# Patient Record
Sex: Female | Born: 1935 | Race: Black or African American | Hispanic: No | Marital: Single | Smoking: Never smoker
Health system: Southern US, Community
[De-identification: ages and names within clinical notes are randomized; demographics above are authoritative.]

## PROBLEM LIST (undated history)

## (undated) DIAGNOSIS — M1990 Unspecified osteoarthritis, unspecified site: Secondary | ICD-10-CM

## (undated) DIAGNOSIS — K219 Gastro-esophageal reflux disease without esophagitis: Secondary | ICD-10-CM

## (undated) DIAGNOSIS — N189 Chronic kidney disease, unspecified: Secondary | ICD-10-CM

## (undated) DIAGNOSIS — E785 Hyperlipidemia, unspecified: Secondary | ICD-10-CM

## (undated) DIAGNOSIS — I10 Essential (primary) hypertension: Secondary | ICD-10-CM

## (undated) HISTORY — DX: Essential (primary) hypertension: I10

## (undated) HISTORY — DX: Chronic kidney disease, unspecified: N18.9

## (undated) HISTORY — DX: Gastro-esophageal reflux disease without esophagitis: K21.9

## (undated) HISTORY — DX: Unspecified osteoarthritis, unspecified site: M19.90

## (undated) HISTORY — DX: Hyperlipidemia, unspecified: E78.5

## (undated) HISTORY — PX: EYE SURGERY: SHX253

---

## 2010-04-13 ENCOUNTER — Observation Stay: Payer: Self-pay | Admitting: Internal Medicine

## 2011-07-15 ENCOUNTER — Emergency Department: Payer: Self-pay | Admitting: *Deleted

## 2011-07-15 LAB — CBC WITH DIFFERENTIAL/PLATELET
Basophil %: 0.6 %
Eosinophil #: 0.1 10*3/uL (ref 0.0–0.7)
Eosinophil %: 1.3 %
HCT: 32.1 % — ABNORMAL LOW (ref 35.0–47.0)
HGB: 10 g/dL — ABNORMAL LOW (ref 12.0–16.0)
MCH: 25.5 pg — ABNORMAL LOW (ref 26.0–34.0)
MCHC: 31.2 g/dL — ABNORMAL LOW (ref 32.0–36.0)
MCV: 82 fL (ref 80–100)
Monocyte #: 0.5 10*3/uL (ref 0.0–0.7)
Monocyte %: 10.3 %
Neutrophil #: 3 10*3/uL (ref 1.4–6.5)
Neutrophil %: 64 %
RBC: 3.93 10*6/uL (ref 3.80–5.20)

## 2011-07-15 LAB — BASIC METABOLIC PANEL
BUN: 13 mg/dL (ref 7–18)
Calcium, Total: 9.7 mg/dL (ref 8.5–10.1)
Chloride: 103 mmol/L (ref 98–107)
Co2: 30 mmol/L (ref 21–32)
EGFR (African American): 59 — ABNORMAL LOW
Osmolality: 277 (ref 275–301)
Potassium: 3.7 mmol/L (ref 3.5–5.1)
Sodium: 139 mmol/L (ref 136–145)

## 2012-03-03 ENCOUNTER — Emergency Department: Payer: Self-pay | Admitting: Emergency Medicine

## 2014-05-21 ENCOUNTER — Ambulatory Visit: Payer: Self-pay | Admitting: Family Medicine

## 2014-06-20 ENCOUNTER — Ambulatory Visit: Payer: Self-pay | Admitting: Internal Medicine

## 2014-07-01 ENCOUNTER — Ambulatory Visit: Payer: Self-pay | Admitting: Hematology and Oncology

## 2014-07-02 LAB — RETICULOCYTES
ABSOLUTE RETIC COUNT: 0.073 10*6/uL (ref 0.019–0.186)
Reticulocyte: 2 % (ref 0.4–3.1)

## 2014-07-02 LAB — FERRITIN: FERRITIN (ARMC): 61 ng/mL (ref 8–388)

## 2014-07-02 LAB — CBC CANCER CENTER
HCT: 30.9 % — AB (ref 35.0–47.0)
HGB: 9.3 g/dL — ABNORMAL LOW (ref 12.0–16.0)
Lymphocytes: 31 %
MCH: 25 pg — ABNORMAL LOW (ref 26.0–34.0)
MCHC: 30.1 g/dL — ABNORMAL LOW (ref 32.0–36.0)
MCV: 83 fL (ref 80–100)
MONOS PCT: 7 %
Platelet: 224 x10 3/mm (ref 150–440)
RBC: 3.72 10*6/uL — AB (ref 3.80–5.20)
RDW: 16.1 % — AB (ref 11.5–14.5)
Segmented Neutrophils: 62 %
WBC: 5 x10 3/mm (ref 3.6–11.0)

## 2014-07-02 LAB — FOLATE: Folic Acid: 19.9 ng/mL — ABNORMAL HIGH (ref 3.1–17.5)

## 2014-07-02 LAB — IRON AND TIBC
IRON BIND. CAP.(TOTAL): 358 ug/dL (ref 250–450)
Iron Saturation: 12 %
Iron: 44 ug/dL — ABNORMAL LOW (ref 50–170)
Unbound Iron-Bind.Cap.: 314 ug/dL

## 2014-07-02 LAB — LACTATE DEHYDROGENASE: LDH: 223 U/L (ref 81–246)

## 2014-07-03 LAB — PROT IMMUNOELECTROPHORES(ARMC)

## 2014-07-14 ENCOUNTER — Ambulatory Visit: Payer: Self-pay | Admitting: Internal Medicine

## 2014-07-14 ENCOUNTER — Ambulatory Visit: Payer: Self-pay | Admitting: Hematology and Oncology

## 2014-07-14 LAB — URINE IEP, RANDOM

## 2014-07-16 LAB — OCCULT BLOOD X 1 CARD TO LAB, STOOL
Occult Blood, Feces: NEGATIVE
Occult Blood, Feces: NEGATIVE

## 2014-08-12 ENCOUNTER — Ambulatory Visit
Admit: 2014-08-12 | Disposition: A | Discharge status: Home / Self Care | Payer: Self-pay | Attending: Internal Medicine | Admitting: Internal Medicine

## 2014-11-19 ENCOUNTER — Ambulatory Visit: Payer: Self-pay | Admitting: Internal Medicine

## 2014-11-19 ENCOUNTER — Other Ambulatory Visit: Payer: Self-pay

## 2014-11-20 ENCOUNTER — Other Ambulatory Visit: Payer: Self-pay

## 2014-11-20 DIAGNOSIS — D649 Anemia, unspecified: Secondary | ICD-10-CM

## 2014-11-21 ENCOUNTER — Other Ambulatory Visit: Payer: Medicare PPO

## 2014-11-21 ENCOUNTER — Ambulatory Visit: Payer: Self-pay | Admitting: Internal Medicine

## 2014-12-02 ENCOUNTER — Encounter: Payer: Self-pay | Admitting: Internal Medicine

## 2014-12-02 ENCOUNTER — Inpatient Hospital Stay: Payer: Medicare PPO

## 2014-12-02 ENCOUNTER — Inpatient Hospital Stay: Payer: Medicare PPO | Attending: Internal Medicine | Admitting: Internal Medicine

## 2014-12-02 VITALS — BP 189/75 | HR 60 | Temp 97.8°F | Resp 18 | Ht 64.0 in | Wt 153.4 lb

## 2014-12-02 DIAGNOSIS — D631 Anemia in chronic kidney disease: Secondary | ICD-10-CM | POA: Diagnosis not present

## 2014-12-02 DIAGNOSIS — N289 Disorder of kidney and ureter, unspecified: Secondary | ICD-10-CM | POA: Diagnosis not present

## 2014-12-02 DIAGNOSIS — D638 Anemia in other chronic diseases classified elsewhere: Secondary | ICD-10-CM | POA: Insufficient documentation

## 2014-12-02 DIAGNOSIS — I10 Essential (primary) hypertension: Secondary | ICD-10-CM | POA: Diagnosis not present

## 2014-12-02 DIAGNOSIS — D509 Iron deficiency anemia, unspecified: Secondary | ICD-10-CM

## 2014-12-02 DIAGNOSIS — Z79899 Other long term (current) drug therapy: Secondary | ICD-10-CM | POA: Diagnosis not present

## 2014-12-02 DIAGNOSIS — D649 Anemia, unspecified: Secondary | ICD-10-CM

## 2014-12-02 DIAGNOSIS — N189 Chronic kidney disease, unspecified: Secondary | ICD-10-CM

## 2014-12-02 LAB — CBC WITH DIFFERENTIAL/PLATELET
Basophils Absolute: 0 10*3/uL (ref 0–0.1)
Basophils Relative: 1 %
EOS PCT: 2 %
Eosinophils Absolute: 0.1 10*3/uL (ref 0–0.7)
HEMATOCRIT: 32.5 % — AB (ref 35.0–47.0)
HEMOGLOBIN: 10 g/dL — AB (ref 12.0–16.0)
Lymphocytes Relative: 28 %
Lymphs Abs: 1.2 10*3/uL (ref 1.0–3.6)
MCH: 25.6 pg — ABNORMAL LOW (ref 26.0–34.0)
MCHC: 31 g/dL — ABNORMAL LOW (ref 32.0–36.0)
MCV: 82.6 fL (ref 80.0–100.0)
MONO ABS: 0.4 10*3/uL (ref 0.2–0.9)
Monocytes Relative: 9 %
Neutro Abs: 2.6 10*3/uL (ref 1.4–6.5)
Neutrophils Relative %: 60 %
Platelets: 189 10*3/uL (ref 150–440)
RBC: 3.93 MIL/uL (ref 3.80–5.20)
RDW: 15.1 % — ABNORMAL HIGH (ref 11.5–14.5)
WBC: 4.4 10*3/uL (ref 3.6–11.0)

## 2014-12-02 LAB — IRON AND TIBC
Iron: 51 ug/dL (ref 28–170)
Saturation Ratios: 15 % (ref 10.4–31.8)
TIBC: 342 ug/dL (ref 250–450)
UIBC: 291 ug/dL

## 2014-12-02 LAB — FERRITIN: Ferritin: 56 ng/mL (ref 11–307)

## 2014-12-14 NOTE — Progress Notes (Signed)
[facility]  Telephone:([phone] Fax:(336) [phone]     ID: Karen SchoolingMollie Davila OB: 08/21/1917  MR#: [id number]  NFA#:[id number]#:[id number]  Patient Care Team: Ellyn HackSyed Asad A Shah, MD as PCP - General (Family Medicine)  CHIEF COMPLAINT/DIAGNOSIS:  Persistent Anemia with fatigability, low MCV, likely from mild iron deificency and anemia of chronic disease (chronic renal isufficiency, Cr 1.15 with low calculated Cr clearance of 28-35 mL/minute).  Labs done on 06/18/14 showed Hb 8.3 g/dL, WBC [id number] with unremarkable differential, ANC 3500, platelets 237, MCV 80, Cr 1.15, Ca 9.4. Prior to this in Aug 2015, Hb was 9.5, and was 9.0 in Dec 2015.   Workup done on 07/02/2014 - hemoglobin 9.3, MCV 83, platelet count 234, WBC 5000 with 62% neutrophils, 31% lymphocytes, absolute retic 0.073. Serum erythropoietin 26.8, ferritin 61, serum iron low at 44, iron saturation low at 12%, TIBC 358. Otherwise, LDH, B12, haptoglobin, folate, direct Coombs test, SIEP all unremarkable.     HISTORY OF PRESENT ILLNESS:  Patient returns for continued hematology followup. States she is doing steady. She had workup done for anemia as above on January 20 which showed mild iron deficiency. States that she continues to have chronic fatigability on exertion, mild dyspnea on physical activity but otherwise tries to remain physically active. No dyspnea at rest, orthopnea, or PND. No angina, palpitation, or dizziness. Appetite is good. Denies any new bone pains. Denies any obvious bleeding symptoms.   REVIEW OF SYSTEMS:   ROS As in HPI above. In addition, no fevers or night sweats. No new headaches or focal weakness.  No sore throat, cough, shortness of breath, hemoptysis or chest pain. No abdominal pain, constipation, diarrhea, dysuria or hematuria. No new skin rash or bleeding symptoms. No new paresthesias in extremities.   PAST MEDICAL HISTORY: Reviewed. Past Medical History  Diagnosis Date  . Hypertension   . Arthritis     Hypertension.  Hyperlipidemia.  Hypothyroidism.  GERD.  Osteoporosis.  Dementia.  Anemia.    FAMILY HISTORY - Remarkable for stroke and hypertension. Denies hematological disorders. Sister with CLL.    SOCIAL HISTORY - Denies smoking, alcohol, or recreational drug usage  PAST SURGICAL HISTORY: Reviewed. No past surgical history on file.  FAMILY HISTORY: Reviewed. Family History  Problem Relation Age of Onset  . Cancer Brother     ADVANCED DIRECTIVES:  <no information>  SOCIAL HISTORY: Reviewed. History  Substance Use Topics  . Smoking status: Never Smoker   . Smokeless tobacco: Never Used  . Alcohol Use: No    Not on File  Current Outpatient Prescriptions  Medication Sig Dispense Refill  . atorvastatin (LIPITOR) 10 MG tablet Take 10 mg by mouth daily.    [name]. levothyroxine (SYNTHROID, LEVOTHROID) 50 MCG tablet Take 50 mcg by mouth daily.    [name]. lisinopril (PRINIVIL,ZESTRIL) 20 MG tablet Take 20 mg by mouth daily.    [name]. omeprazole (PRILOSEC) 20 MG capsule Take 20 mg by mouth daily.     No current facility-administered medications for this visit.    PHYSICAL EXAM: Filed Vitals:   12/02/14 1156  BP: 189/75  Pulse: 60  Temp: 97.8 F (36.6 C)  Resp: 18     Body mass index is 26.32 kg/(m^2).     GENERAL: Patient is alert and oriented and in no acute distress. No icterus. Mild pallor. HEENT: EOMs intact. No cervical lymphadenopathy. LUNGS: Bilaterally clear to auscultation, no rhonchi. ABDOMEN: Soft, nontender. EXTREMITIES: No pedal edema.   LAB RESULTS:  Hb 10, WBC 4.4,  plts 189, serum Fe 51, iron sat 15%, ferritin 56.    Component Value Date/Time   NA 139 07/15/2011 1550   K 3.7 07/15/2011 1550   CL 103 07/15/2011 1550   CO2 30 07/15/2011 1550   GLUCOSE 95 07/15/2011 1550   BUN 13 07/15/2011 1550   CREATININE 1.12 07/15/2011 1550   CALCIUM 9.7 07/15/2011 1550   Lab Results  Component Value Date   WBC 4.4 12/02/2014   NEUTROABS 2.6 12/02/2014   HGB  10.0* 12/02/2014   HCT 32.5* 12/02/2014   MCV 82.6 12/02/2014   PLT 189 12/02/2014  Feb 2016 - stool OB x 2 negative. 06/18/14 - Hb 8.3 g/dL, Hct 04.5%, WBC 4098 with unremarkable differential, ANC 3500, platelets 237, MCV 80, Cr 1.15, Ca 9.4.  August 2015 - Hb was 9.5, it was 9.0 in Dec 2015.   ASSESSMENT / PLAN:   Anemia with low MCV, likely from mild iron deificency and anemia of chronic disease (chronic renal isufficiency, Cr 1.15 with low calculated Cr clearance of 28-35 mL/minute)  - reviewed labs from today and d/w patient and family present. Iron study has normalized, Hb still remains mildly low likely from component of anemia of chronic disease. Patient refused GI evaluation, stool hemoccult negative in Feb 2016. Plan is to continue oral iron and monitor. Patient also prefers conitnued monitoring by PMD. She is therefore being discharged from our clinic. Recommend monitoring CBC and iron study upon PMD visits every 4-6 months, I would be happy to see her back if recurrent/progreesive iron def anemia occurs. If hemoglobin remains significantly below 10 despite correction of iron deficiency, then we could consider ESA therapy like Procrit for anemia of chronic renal insufficiency. In between visits, patient advised to call MD or come to ER in case of any progressive anemic symptoms or sickness. She is agreeable to this plan.   Janese Banks, MD   12/14/2014 12:13 AM

## 2014-12-23 ENCOUNTER — Ambulatory Visit (INDEPENDENT_AMBULATORY_CARE_PROVIDER_SITE_OTHER): Payer: Medicare PPO | Admitting: Family Medicine

## 2014-12-23 ENCOUNTER — Encounter: Payer: Self-pay | Admitting: Family Medicine

## 2014-12-23 VITALS — BP 170/70 | HR 75 | Temp 98.7°F | Resp 18 | Ht 64.0 in | Wt 153.5 lb

## 2014-12-23 DIAGNOSIS — E785 Hyperlipidemia, unspecified: Secondary | ICD-10-CM | POA: Diagnosis not present

## 2014-12-23 DIAGNOSIS — I10 Essential (primary) hypertension: Secondary | ICD-10-CM

## 2014-12-23 DIAGNOSIS — E038 Other specified hypothyroidism: Secondary | ICD-10-CM | POA: Diagnosis not present

## 2014-12-23 DIAGNOSIS — E039 Hypothyroidism, unspecified: Secondary | ICD-10-CM | POA: Insufficient documentation

## 2014-12-23 DIAGNOSIS — K219 Gastro-esophageal reflux disease without esophagitis: Secondary | ICD-10-CM | POA: Diagnosis not present

## 2014-12-23 DIAGNOSIS — D509 Iron deficiency anemia, unspecified: Secondary | ICD-10-CM

## 2014-12-23 MED ORDER — LEVOTHYROXINE SODIUM 50 MCG PO TABS: 50.0000 ug | ORAL_TABLET | Freq: Every day | ORAL | Status: DC

## 2014-12-23 MED ORDER — AMLODIPINE BESYLATE 5 MG PO TABS: 5.0000 mg | ORAL_TABLET | Freq: Every day | ORAL | Status: DC

## 2014-12-23 NOTE — Progress Notes (Signed)
Name: Karen Davila   MRN: [id number]    DOB: 09/05/1917   Date:12/23/2014       Progress Note  Subjective  Chief Complaint  Chief Complaint  Patient presents with  . Establish Care    New Patient (Dr. Cecile HearingMoffiet) Med Refills  . Anemia  . Hypothyroidism  . Hyperlipidemia  . Hypertension    Anemia Presents for follow-up visit. There has been no abdominal pain, light-headedness, malaise/fatigue or palpitations. Signs of blood loss that are not present include hematemesis, hematochezia, melena and vaginal bleeding. Past treatments include oral iron supplements (Evaluated by Hematology.). There is no history of recent illness, recent surgery or recent trauma. There is no past history of colonoscopy or EGD.  Hyperlipidemia The problem is controlled. Recent lipid tests were reviewed and are normal. She has no history of diabetes. Pertinent negatives include no chest pain, leg pain, myalgias or shortness of breath. Current antihyperlipidemic treatment includes statins.  Hypertension This is a chronic problem. The problem is uncontrolled. Pertinent negatives include no chest pain, headaches, malaise/fatigue, palpitations or shortness of breath. Past treatments include calcium channel blockers and ACE inhibitors. Compliance problems: ran out of Amlodipine 5mg  and has not taken it. for over 2 months.  Hypertensive end-organ damage includes kidney disease and a thyroid problem. There is no history of CAD/MI or CVA.  Thyroid Problem Presents for follow-up visit. Symptoms include constipation. Patient reports no anxiety, cold intolerance, depressed mood, dry skin, leg swelling, palpitations or weight gain. Past treatments include levothyroxine. The following procedures have not been performed: radioiodine uptake scan, thyroid ultrasound and thyroidectomy. Her past medical history is significant for hyperlipidemia. There is no history of diabetes.      Past Medical History  Diagnosis Date  . Hypertension    . Arthritis   . Thyroid disease   . Hyperlipidemia   . Chronic kidney disease   . GERD (gastroesophageal reflux disease)     Past Surgical History  Procedure Laterality Date  . Eye surgery Right     Cataract    Family History  Problem Relation Age of Onset  . Cancer Brother     History   Social History  . Marital Status: Single    Spouse Name: N/A  . Number of Children: N/A  . Years of Education: N/A   Occupational History  . Not on file.   Social History Main Topics  . Smoking status: Never Smoker   . Smokeless tobacco: Never Used  . Alcohol Use: No  . Drug Use: No  . Sexual Activity: No   Other Topics Concern  . Not on file   Social History Narrative     Current outpatient prescriptions:  .  aspirin 81 MG tablet, Take 1 tablet by mouth daily., Disp: , Rfl:  .  atorvastatin (LIPITOR) 10 MG tablet, Take 10 mg by mouth daily., Disp: , Rfl:  .  bisacodyl (DULCOLAX) 5 MG EC tablet, Take by mouth., Disp: , Rfl:  .  levothyroxine (SYNTHROID, LEVOTHROID) 50 MCG tablet, Take 50 mcg by mouth daily., Disp: , Rfl:  .  lisinopril (PRINIVIL,ZESTRIL) 20 MG tablet, Take 20 mg by mouth daily., Disp: , Rfl:  .  omeprazole (PRILOSEC) 20 MG capsule, Take 20 mg by mouth daily., Disp: , Rfl:  .  senna (SENOKOT) 8.6 MG tablet, Take 1 tablet by mouth daily., Disp: , Rfl:   No Known Allergies   Review of Systems  Constitutional: Negative for weight gain and malaise/fatigue.  Respiratory: Negative for  shortness of breath.   Cardiovascular: Negative for chest pain and palpitations.  Gastrointestinal: Positive for constipation. Negative for abdominal pain, melena, hematochezia and hematemesis.  Genitourinary: Negative for vaginal bleeding.  Musculoskeletal: Negative for myalgias.  Neurological: Negative for light-headedness and headaches.  Endo/Heme/Allergies: Negative for cold intolerance.  Psychiatric/Behavioral: The patient is not nervous/anxious.        Objective  Filed Vitals:   12/23/14 1058  BP: 170/70  Pulse: 75  Temp: 98.7 F (37.1 C)  TempSrc: Oral  Resp: 18  Height:  (1.626 m)  Weight: 153 lb 8 oz (69.627 kg)  SpO2: 92%    Physical Exam  Constitutional: She is well-developed, well-nourished, and in no distress.  HENT:  Head: Normocephalic and atraumatic.  Neck: Normal range of motion. No thyromegaly present.  Cardiovascular: Normal rate and regular rhythm.   Pulmonary/Chest: Effort normal and breath sounds normal.  Abdominal: Soft. Bowel sounds are normal.  Neurological: She is alert.  Skin: Skin is warm and dry.  Psychiatric: Affect normal.  Nursing note and vitals reviewed.      Recent Results (from the past 2160 hour(s))  CBC with Differential/Platelet     Status: Abnormal   Collection Time: 12/02/14 10:20 AM  Result Value Ref Range   WBC 4.4 3.6 - 11.0 K/uL   RBC 3.93 3.80 - 5.20 MIL/uL   Hemoglobin 10.0 (L) 12.0 - 16.0 g/dL   HCT 69.6 (L) 29.5 - 28.4 %   MCV 82.6 80.0 - 100.0 fL   MCH 25.6 (L) 26.0 - 34.0 pg   MCHC 31.0 (L) 32.0 - 36.0 g/dL   RDW 13.2 (H) 44.0 - 10.2 %   Platelets 189 150 - 440 K/uL   Neutrophils Relative % 60 %   Neutro Abs 2.6 1.4 - 6.5 K/uL   Lymphocytes Relative 28 %   Lymphs Abs 1.2 1.0 - 3.6 K/uL   Monocytes Relative 9 %   Monocytes Absolute 0.4 0.2 - 0.9 K/uL   Eosinophils Relative 2 %   Eosinophils Absolute 0.1 0 - 0.7 K/uL   Basophils Relative 1 %   Basophils Absolute 0.0 0 - 0.1 K/uL  Ferritin     Status: None   Collection Time: 12/02/14 10:20 AM  Result Value Ref Range   Ferritin 56 11 - 307 ng/mL  Iron and TIBC     Status: None   Collection Time: 12/02/14 10:20 AM  Result Value Ref Range   Iron 51 28 - 170 ug/dL   TIBC 725 366 - 440 ug/dL   Saturation Ratios 15 10.4 - 31.8 %   UIBC 291 ug/dL     Assessment & Plan 1. Essential hypertension Patient has been out of amlodipine for at least 2 months. Blood pressure is elevated as a result. She  will be restarted on amlodipine and follow-up in 6 weeks.  - amLODipine (NORVASC) 5 MG tablet; Take 1 tablet (5 mg total) by mouth daily.  Dispense: 90 tablet; Refill: 1 - Comprehensive metabolic panel  2. Gastroesophageal reflux disease, esophagitis presence not specified Currently on omeprazole 20 mg daily, which seems to be working well.  3. Other specified hypothyroidism  - levothyroxine (SYNTHROID, LEVOTHROID) 50 MCG tablet; Take 1 tablet (50 mcg total) by mouth daily before breakfast.  Dispense: 90 tablet; Refill: 1 - TSH  4. Hyperlipidemia  - Lipid Profile  5. Iron deficiency anemia Resume on OTC iron therapy. Recent labs by hematologist show improvement in hemoglobin and hematocrit. She has no symptoms of  any acute blood loss. Recheck CBC in October 2016.  Varnika Butz [name] 12/23/2014 11:23 AM

## 2015-01-12 ENCOUNTER — Emergency Department
Admission: EM | Admit: 2015-01-12 | Discharge: 2015-01-12 | Disposition: A | Discharge status: Home / Self Care | Payer: Medicare PPO | Attending: Emergency Medicine | Admitting: Emergency Medicine

## 2015-01-12 ENCOUNTER — Encounter: Payer: Self-pay | Admitting: Emergency Medicine

## 2015-01-12 ENCOUNTER — Emergency Department: Payer: Medicare PPO

## 2015-01-12 DIAGNOSIS — Z79899 Other long term (current) drug therapy: Secondary | ICD-10-CM | POA: Diagnosis not present

## 2015-01-12 DIAGNOSIS — I129 Hypertensive chronic kidney disease with stage 1 through stage 4 chronic kidney disease, or unspecified chronic kidney disease: Secondary | ICD-10-CM | POA: Diagnosis not present

## 2015-01-12 DIAGNOSIS — R05 Cough: Secondary | ICD-10-CM | POA: Diagnosis present

## 2015-01-12 DIAGNOSIS — Z7982 Long term (current) use of aspirin: Secondary | ICD-10-CM | POA: Diagnosis not present

## 2015-01-12 DIAGNOSIS — N189 Chronic kidney disease, unspecified: Secondary | ICD-10-CM | POA: Insufficient documentation

## 2015-01-12 DIAGNOSIS — J40 Bronchitis, not specified as acute or chronic: Secondary | ICD-10-CM

## 2015-01-12 DIAGNOSIS — J209 Acute bronchitis, unspecified: Secondary | ICD-10-CM | POA: Insufficient documentation

## 2015-01-12 MED ORDER — IPRATROPIUM-ALBUTEROL 0.5-2.5 (3) MG/3ML IN SOLN
3.0000 mL | Freq: Once | RESPIRATORY_TRACT | Status: AC
  Administered 2015-01-12: 3 mL via RESPIRATORY_TRACT
  Filled 2015-01-12: qty 3

## 2015-01-12 MED ORDER — AZITHROMYCIN 250 MG PO TABS: ORAL_TABLET | ORAL | Status: AC

## 2015-01-12 NOTE — Discharge Instructions (Signed)
Upper Respiratory Infection, Adult An upper respiratory infection (URI) is also sometimes known as the common cold. The upper respiratory tract includes the nose, sinuses, throat, trachea, and bronchi. Bronchi are the airways leading to the lungs. Most people improve within 1 week, but symptoms can last up to 2 weeks. A residual cough may last even longer.  CAUSES Many different viruses can infect the tissues lining the upper respiratory tract. The tissues become irritated and inflamed and often become very moist. Mucus production is also common. A cold is contagious. You can easily spread the virus to others by oral contact. This includes kissing, sharing a glass, coughing, or sneezing. Touching your mouth or nose and then touching a surface, which is then touched by another person, can also spread the virus. SYMPTOMS  Symptoms typically develop 1 to 3 days after you come in contact with a cold virus. Symptoms vary from person to person. They may include:  Runny nose.  Sneezing.  Nasal congestion.  Sinus irritation.  Sore throat.  Loss of voice (laryngitis).  Cough.  Fatigue.  Muscle aches.  Loss of appetite.  Headache.  Low-grade fever. DIAGNOSIS  You might diagnose your own cold based on familiar symptoms, since most people get a cold 2 to 3 times a year. Your caregiver can confirm this based on your exam. Most importantly, your caregiver can check that your symptoms are not due to another disease such as strep throat, sinusitis, pneumonia, asthma, or epiglottitis. Blood tests, throat tests, and X-rays are not necessary to diagnose a common cold, but they may sometimes be helpful in excluding other more serious diseases. Your caregiver will decide if any further tests are required. RISKS AND COMPLICATIONS  You may be at risk for a more severe case of the common cold if you smoke cigarettes, have chronic heart disease (such as heart failure) or lung disease (such as asthma), or if  you have a weakened immune system. The very young and very old are also at risk for more serious infections. Bacterial sinusitis, middle ear infections, and bacterial pneumonia can complicate the common cold. The common cold can worsen asthma and chronic obstructive pulmonary disease (COPD). Sometimes, these complications can require emergency medical care and may be life-threatening. PREVENTION  The best way to protect against getting a cold is to practice good hygiene. Avoid oral or hand contact with people with cold symptoms. Wash your hands often if contact occurs. There is no clear evidence that vitamin C, vitamin E, echinacea, or exercise reduces the chance of developing a cold. However, it is always recommended to get plenty of rest and practice good nutrition. TREATMENT  Treatment is directed at relieving symptoms. There is no cure. Antibiotics are not effective, because the infection is caused by a virus, not by bacteria. Treatment may include:  Increased fluid intake. Sports drinks offer valuable electrolytes, sugars, and fluids.  Breathing heated mist or steam (vaporizer or shower).  Eating chicken soup or other clear broths, and maintaining good nutrition.  Getting plenty of rest.  Using gargles or lozenges for comfort.  Controlling fevers with ibuprofen or acetaminophen as directed by your caregiver.  Increasing usage of your inhaler if you have asthma. Zinc gel and zinc lozenges, taken in the first 24 hours of the common cold, can shorten the duration and lessen the severity of symptoms. Pain medicines may help with fever, muscle aches, and throat pain. A variety of non-prescription medicines are available to treat congestion and runny nose. Your caregiver   can make recommendations and may suggest nasal or lung inhalers for other symptoms.  HOME CARE INSTRUCTIONS   Only take over-the-counter or prescription medicines for pain, discomfort, or fever as directed by your  caregiver.  Use a warm mist humidifier or inhale steam from a shower to increase air moisture. This may keep secretions moist and make it easier to breathe.  Drink enough water and fluids to keep your urine clear or pale yellow.  Rest as needed.  Return to work when your temperature has returned to normal or as your caregiver advises. You may need to stay home longer to avoid infecting others. You can also use a face mask and careful hand washing to prevent spread of the virus. SEEK MEDICAL CARE IF:   After the first few days, you feel you are getting worse rather than better.  You need your caregiver's advice about medicines to control symptoms.  You develop chills, worsening shortness of breath, or brown or red sputum. These may be signs of pneumonia.  You develop yellow or brown nasal discharge or pain in the face, especially when you bend forward. These may be signs of sinusitis.  You develop a fever, swollen neck glands, pain with swallowing, or white areas in the back of your throat. These may be signs of strep throat. SEEK IMMEDIATE MEDICAL CARE IF:   You have a fever.  You develop severe or persistent headache, ear pain, sinus pain, or chest pain.  You develop wheezing, a prolonged cough, cough up blood, or have a change in your usual mucus (if you have chronic lung disease).  You develop sore muscles or a stiff neck. Document Released: 11/23/2000 Document Revised: 08/22/2011 Document Reviewed: 09/04/2013 Exit[employer] Patient Information 2015 [employer]. This information is not intended to replace advice given to you by your health care provider. Make sure you discuss any questions you have with your health care provider.  

## 2015-01-12 NOTE — ED Notes (Signed)
Family reports pt with chest cold and fever since Friday; nephew reports pt with cough and now some pain in the rib area. Pt reports white phlegm.

## 2015-01-12 NOTE — ED Provider Notes (Signed)
[facility] Emergency Department Provider Note  ____________________________________________  Time seen: On arrival  I have reviewed the triage vital signs and the nursing notes.   HISTORY  Chief Complaint Cough    HPI Karen Davila is a 81 y.o. female who presents with a cough. She reports she has been coughing for 3 days now and has tried some over-the-counter cough medication without relief. She is producing some small amount of white phlegm and she denies shortness of breath. She denies chest pain to me. No fevers no chills. No leg swelling, or recent travel.    Past Medical History  Diagnosis Date  . Hypertension   . Arthritis   . Thyroid disease   . Hyperlipidemia   . Chronic kidney disease   . GERD (gastroesophageal reflux disease)     Patient Active Problem List   Diagnosis Date Noted  . Hypertension 12/23/2014  . GERD (gastroesophageal reflux disease) 12/23/2014  . Adult hypothyroidism 12/23/2014  . Hyperlipidemia 12/23/2014  . Iron deficiency anemia 12/23/2014    Past Surgical History  Procedure Laterality Date  . Eye surgery Right     Cataract    Current Outpatient Rx  Name  Route  Sig  Dispense  Refill  . amLODipine (NORVASC) 5 MG tablet   Oral   Take 1 tablet (5 mg total) by mouth daily.   90 tablet   1   . aspirin 81 MG tablet   Oral   Take 1 tablet by mouth daily.         [name] atorvastatin (LIPITOR) 10 MG tablet   Oral   Take 10 mg by mouth daily.         [name] azithromycin (ZITHROMAX Z-PAK) 250 MG tablet      Take 2 tablets (500 mg) on  Day 1,  followed by 1 tablet (250 mg) once daily on Days 2 through 5.   6 each   0   . bisacodyl (DULCOLAX) 5 MG EC tablet   Oral   Take by mouth.         . levothyroxine (SYNTHROID, LEVOTHROID) 50 MCG tablet   Oral   Take 1 tablet (50 mcg total) by mouth daily before breakfast.   90 tablet   1   . lisinopril (PRINIVIL,ZESTRIL) 20 MG tablet   Oral   Take 20 mg by mouth  daily.         [name] omeprazole (PRILOSEC) 20 MG capsule   Oral   Take 20 mg by mouth daily.         [name] senna (SENOKOT) 8.6 MG tablet   Oral   Take 1 tablet by mouth daily.           Allergies Review of patient's allergies indicates no known allergies.  Family History  Problem Relation Age of Onset  . Cancer Brother     Social History History  Substance Use Topics  . Smoking status: Never Smoker   . Smokeless tobacco: Never Used  . Alcohol Use: No    Review of Systems  Constitutional: Negative for fever. Eyes: Negative for visual changes. ENT: Negative for sore throat   Genitourinary: Negative for dysuria. Musculoskeletal: Negative for back pain. Skin: Negative for rash. Neurological: Negative for headaches or focal weakness   ____________________________________________   PHYSICAL EXAM:  VITAL SIGNS: ED Triage Vitals  Enc Vitals Group     BP 01/12/15 1107 134/60 mmHg     Pulse Rate 01/12/15 1107 76  Resp 01/12/15 1107 20     Temp 01/12/15 1107 98.3 F (36.8 C)     Temp Source 01/12/15 1107 Oral     SpO2 --      Weight 01/12/15 1107 150 lb (68.04 kg)     Height 01/12/15 1107 5\' 5"  (1.651 m)     Head Cir --      Peak Flow --      Pain Score 01/12/15 1108 3     Pain Loc --      Pain Edu? --      Excl. in GC? --      Constitutional: Alert and oriented. Well appearing and in no distress. Hard of hearing Eyes: Conjunctivae are normal.  ENT   Head: Normocephalic and atraumatic.   Mouth/Throat: Mucous membranes are moist. Cardiovascular: Normal rate, regular rhythm.  Respiratory: Normal respiratory effort without tachypnea nor retractions.  Gastrointestinal: Soft and non-tender in all quadrants. No distention. There is no CVA tenderness. Musculoskeletal: Nontender with normal range of motion in all extremities. Neurologic:  Normal speech and language. No gross focal neurologic deficits are appreciated. Skin:  Skin is warm, dry and  intact. No rash noted. Psychiatric: Mood and affect are normal. Patient exhibits appropriate insight and judgment.  ____________________________________________    LABS (pertinent positives/negatives)  Labs Reviewed - No data to display  ____________________________________________     ____________________________________________    RADIOLOGY I have personally reviewed any xrays that were ordered on this patient: Chest x-ray shows bronchitic changes  ____________________________________________   PROCEDURES  Procedure(s) performed: none   ____________________________________________   INITIAL IMPRESSION / ASSESSMENT AND PLAN / ED COURSE  Pertinent labs & imaging results that were available during my care of the patient were reviewed by me and considered in my medical decision making (see chart for details).  Patient very well-appearing. Pleasant and interactive. All vitals normal. X-ray was consistent with bronchitic changes we will prescribe antibiotics and have her follow-up with her PCP. She does return to the emergency department if any worsening in her breathing or cough  ____________________________________________   FINAL CLINICAL IMPRESSION(S) / ED DIAGNOSES  Final diagnoses:  Bronchitis     Jene Every, MD 01/12/15 1204

## 2015-02-03 ENCOUNTER — Ambulatory Visit (INDEPENDENT_AMBULATORY_CARE_PROVIDER_SITE_OTHER): Payer: Medicare PPO | Admitting: Family Medicine

## 2015-02-03 ENCOUNTER — Encounter: Payer: Self-pay | Admitting: Family Medicine

## 2015-02-03 VITALS — BP 120/71 | HR 82 | Temp 98.6°F | Resp 19 | Ht 65.0 in | Wt 151.0 lb

## 2015-02-03 DIAGNOSIS — D509 Iron deficiency anemia, unspecified: Secondary | ICD-10-CM | POA: Diagnosis not present

## 2015-02-03 DIAGNOSIS — I10 Essential (primary) hypertension: Secondary | ICD-10-CM

## 2015-02-03 NOTE — Progress Notes (Signed)
Name: Karen Davila   MRN: [id number]    DOB: 1918-02-18   Date:02/03/2015       Progress Note  Subjective  Chief Complaint  Chief Complaint  Patient presents with  . Follow-up    6 wk, new medication Amlodipine   . Hypertension  . Hyperlipidemia  . Gastrophageal Reflux    Hypertension This is a chronic problem. The problem is controlled. Pertinent negatives include no anxiety, chest pain, headaches, palpitations or shortness of breath. Past treatments include calcium channel blockers and ACE inhibitors. There are no compliance problems.  There is no history of kidney disease, CAD/MI or CVA.      Past Medical History  Diagnosis Date  . Hypertension   . Arthritis   . Thyroid disease   . Hyperlipidemia   . Chronic kidney disease   . GERD (gastroesophageal reflux disease)     Past Surgical History  Procedure Laterality Date  . Eye surgery Right     Cataract    Family History  Problem Relation Age of Onset  . Cancer Brother     Social History   Social History  . Marital Status: Single    Spouse Name: N/A  . Number of Children: N/A  . Years of Education: N/A   Occupational History  . Not on file.   Social History Main Topics  . Smoking status: Never Smoker   . Smokeless tobacco: Never Used  . Alcohol Use: No  . Drug Use: No  . Sexual Activity: No   Other Topics Concern  . Not on file   Social History Narrative     Current outpatient prescriptions:  .  amLODipine (NORVASC) 5 MG tablet, Take 1 tablet (5 mg total) by mouth daily., Disp: 90 tablet, Rfl: 1 .  aspirin 81 MG tablet, Take 1 tablet by mouth daily., Disp: , Rfl:  .  atorvastatin (LIPITOR) 10 MG tablet, Take 10 mg by mouth daily., Disp: , Rfl:  .  bisacodyl (DULCOLAX) 5 MG EC tablet, Take by mouth., Disp: , Rfl:  .  levothyroxine (SYNTHROID, LEVOTHROID) 50 MCG tablet, Take 1 tablet (50 mcg total) by mouth daily before breakfast., Disp: 90 tablet, Rfl: 1 .  lisinopril (PRINIVIL,ZESTRIL) 20 MG  tablet, Take 20 mg by mouth daily., Disp: , Rfl:  .  omeprazole (PRILOSEC) 20 MG capsule, Take 20 mg by mouth daily., Disp: , Rfl:  .  senna (SENOKOT) 8.6 MG tablet, Take 1 tablet by mouth daily., Disp: , Rfl:   No Known Allergies   Review of Systems  Respiratory: Negative for shortness of breath.   Cardiovascular: Negative for chest pain and palpitations.  Neurological: Negative for headaches.      Objective  Filed Vitals:   02/03/15 0905  BP: 120/71  Pulse: 82  Temp: 98.6 F (37 C)  TempSrc: Oral  Resp: 19  Height:  (1.651 m)  Weight: 151 lb (68.493 kg)  SpO2: 96%    Physical Exam  Constitutional: She is oriented to person, place, and time and well-developed, well-nourished, and in no distress.  Cardiovascular: Normal rate and regular rhythm.   Pulmonary/Chest: Effort normal and breath sounds normal.  Musculoskeletal: She exhibits no edema.  Neurological: She is alert and oriented to person, place, and time.  Skin: Skin is warm and dry.  Nursing note and vitals reviewed.      Recent Results (from the past 2160 hour(s))  CBC with Differential/Platelet     Status: Abnormal   Collection Time: 12/02/14  10:20 AM  Result Value Ref Range   WBC 4.4 3.6 - 11.0 K/uL   RBC 3.93 3.80 - 5.20 MIL/uL   Hemoglobin 10.0 (L) 12.0 - 16.0 g/dL   HCT 16.1 (L) 09.6 - 04.5 %   MCV 82.6 80.0 - 100.0 fL   MCH 25.6 (L) 26.0 - 34.0 pg   MCHC 31.0 (L) 32.0 - 36.0 g/dL   RDW 40.9 (H) 81.1 - 91.4 %   Platelets 189 150 - 440 K/uL   Neutrophils Relative % 60 %   Neutro Abs 2.6 1.4 - 6.5 K/uL   Lymphocytes Relative 28 %   Lymphs Abs 1.2 1.0 - 3.6 K/uL   Monocytes Relative 9 %   Monocytes Absolute 0.4 0.2 - 0.9 K/uL   Eosinophils Relative 2 %   Eosinophils Absolute 0.1 0 - 0.7 K/uL   Basophils Relative 1 %   Basophils Absolute 0.0 0 - 0.1 K/uL  Ferritin     Status: None   Collection Time: 12/02/14 10:20 AM  Result Value Ref Range   Ferritin 56 11 - 307 ng/mL  Iron and TIBC      Status: None   Collection Time: 12/02/14 10:20 AM  Result Value Ref Range   Iron 51 28 - 170 ug/dL   TIBC 782 956 - 213 ug/dL   Saturation Ratios 15 10.4 - 31.8 %   UIBC 291 ug/dL     Assessment & Plan  1. Essential hypertension BP is stable and controlled on present therapy.   2. Iron deficiency anemia Recheck CBC in October 2016.   Karen Davila [facility] 02/03/2015 9:17 AM

## 2015-02-04 LAB — LIPID PANEL
Chol/HDL Ratio: 2.8 ratio units (ref 0.0–4.4)
Cholesterol, Total: 170 mg/dL (ref 100–199)
HDL: 61 mg/dL (ref >39)
LDL CALC: 86 mg/dL (ref 0–99)
Triglycerides: 116 mg/dL (ref 0–149)
VLDL Cholesterol Cal: 23 mg/dL (ref 5–40)

## 2015-02-04 LAB — COMPREHENSIVE METABOLIC PANEL
ALT: 11 IU/L (ref 0–32)
AST: 19 IU/L (ref 0–40)
Albumin/Globulin Ratio: 1.3 (ref 1.1–2.5)
Albumin: 3.9 g/dL (ref 3.2–4.6)
Alkaline Phosphatase: 64 IU/L (ref 39–117)
BILIRUBIN TOTAL: 0.3 mg/dL (ref 0.0–1.2)
BUN/Creatinine Ratio: 11 (ref 11–26)
BUN: 14 mg/dL (ref 10–36)
CHLORIDE: 104 mmol/L (ref 97–108)
CO2: 25 mmol/L (ref 18–29)
Calcium: 9.7 mg/dL (ref 8.7–10.3)
Creatinine, Ser: 1.25 mg/dL — ABNORMAL HIGH (ref 0.57–1.00)
GFR calc non Af Amer: 37 mL/min/{1.73_m2} — ABNORMAL LOW (ref >59)
GFR, EST AFRICAN AMERICAN: 42 mL/min/{1.73_m2} — AB (ref >59)
GLUCOSE: 85 mg/dL (ref 65–99)
Globulin, Total: 3 g/dL (ref 1.5–4.5)
Potassium: 4.7 mmol/L (ref 3.5–5.2)
Sodium: 145 mmol/L — ABNORMAL HIGH (ref 134–144)
TOTAL PROTEIN: 6.9 g/dL (ref 6.0–8.5)

## 2015-02-04 LAB — TSH: TSH: 1.44 u[IU]/mL (ref 0.450–4.500)

## 2015-04-06 ENCOUNTER — Ambulatory Visit (INDEPENDENT_AMBULATORY_CARE_PROVIDER_SITE_OTHER): Payer: Medicare PPO | Admitting: Family Medicine

## 2015-04-06 ENCOUNTER — Encounter: Payer: Self-pay | Admitting: Family Medicine

## 2015-04-06 VITALS — BP 128/72 | HR 89 | Temp 97.8°F | Resp 16 | Wt 150.5 lb

## 2015-04-06 DIAGNOSIS — M810 Age-related osteoporosis without current pathological fracture: Secondary | ICD-10-CM | POA: Insufficient documentation

## 2015-04-06 DIAGNOSIS — I10 Essential (primary) hypertension: Secondary | ICD-10-CM | POA: Diagnosis not present

## 2015-04-06 DIAGNOSIS — R011 Cardiac murmur, unspecified: Secondary | ICD-10-CM

## 2015-04-06 DIAGNOSIS — F0390 Unspecified dementia without behavioral disturbance: Secondary | ICD-10-CM | POA: Insufficient documentation

## 2015-04-06 DIAGNOSIS — D509 Iron deficiency anemia, unspecified: Secondary | ICD-10-CM | POA: Diagnosis not present

## 2015-04-06 DIAGNOSIS — R010 Benign and innocent cardiac murmurs: Secondary | ICD-10-CM | POA: Diagnosis not present

## 2015-04-06 DIAGNOSIS — H9190 Unspecified hearing loss, unspecified ear: Secondary | ICD-10-CM | POA: Insufficient documentation

## 2015-04-06 MED ORDER — AMLODIPINE BESYLATE 5 MG PO TABS
5.0000 mg | ORAL_TABLET | Freq: Every day | ORAL | Status: DC
Start: 2015-04-06 — End: 2015-07-07

## 2015-04-06 NOTE — Progress Notes (Signed)
Name: Karen Davila   MRN: [id number]    DOB: 09/24/1917   Date:04/06/2015       Progress Note  Subjective  Chief Complaint  Chief Complaint  Patient presents with  . Hypertension    patient is here for a 3558-month follow-up and lab reviewal for anemia    Hypertension This is a chronic problem. The problem is controlled. Pertinent negatives include no blurred vision, chest pain, headaches, palpitations or shortness of breath. Past treatments include ACE inhibitors and calcium channel blockers.  Anemia Presents for follow-up visit. There has been no bruising/bleeding easily, fever, palpitations or weight loss. Signs of blood loss that are not present include hematochezia and melena. Past treatments include oral iron supplements. There is no history of cancer.    Past Medical History  Diagnosis Date  . Hypertension   . Arthritis   . Thyroid disease   . Hyperlipidemia   . Chronic kidney disease   . GERD (gastroesophageal reflux disease)     Past Surgical History  Procedure Laterality Date  . Eye surgery Right     Cataract    Family History  Problem Relation Age of Onset  . Cancer Brother     Social History   Social History  . Marital Status: Single    Spouse Name: N/A  . Number of Children: N/A  . Years of Education: N/A   Occupational History  . Not on file.   Social History Main Topics  . Smoking status: Never Smoker   . Smokeless tobacco: Never Used  . Alcohol Use: No  . Drug Use: No  . Sexual Activity: No   Other Topics Concern  . Not on file   Social History Narrative    Current outpatient prescriptions:  .  amLODipine (NORVASC) 5 MG tablet, Take 1 tablet (5 mg total) by mouth daily., Disp: 90 tablet, Rfl: 1 .  aspirin 81 MG tablet, Take 1 tablet by mouth daily., Disp: , Rfl:  .  atorvastatin (LIPITOR) 10 MG tablet, Take 10 mg by mouth daily., Disp: , Rfl:  .  bisacodyl (DULCOLAX) 5 MG EC tablet, Take by mouth., Disp: , Rfl:  .  levothyroxine  (SYNTHROID, LEVOTHROID) 50 MCG tablet, Take 1 tablet (50 mcg total) by mouth daily before breakfast., Disp: 90 tablet, Rfl: 1 .  lisinopril (PRINIVIL,ZESTRIL) 20 MG tablet, Take 20 mg by mouth daily., Disp: , Rfl:  .  omeprazole (PRILOSEC) 20 MG capsule, Take 20 mg by mouth daily., Disp: , Rfl:  .  senna (SENOKOT) 8.6 MG tablet, Take 1 tablet by mouth daily., Disp: , Rfl:   No Known Allergies  Review of Systems  Constitutional: Negative for fever, chills and weight loss.  Eyes: Negative for blurred vision.  Respiratory: Negative for shortness of breath.   Cardiovascular: Negative for chest pain and palpitations.  Gastrointestinal: Negative for melena and hematochezia.  Neurological: Negative for headaches.  Endo/Heme/Allergies: Does not bruise/bleed easily.    Objective  Filed Vitals:   04/06/15 0817  BP: 128/72  Pulse: 89  Temp: 97.8 F (36.6 C)  TempSrc: Oral  Resp: 16  Weight: 150 lb 8 oz (68.266 kg)  SpO2: 97%    Physical Exam  Constitutional: She is oriented to person, place, and time and well-developed, well-nourished, and in no distress.  HENT:  Head: Normocephalic and atraumatic.  Cardiovascular: Normal rate and regular rhythm.   Murmur heard.  Crescendo systolic murmur is present with a grade of 2/6  Pulmonary/Chest: Breath sounds  normal. She has no wheezes.  Abdominal: Soft. Normal appearance. There is no tenderness.  Musculoskeletal: She exhibits edema.  Neurological: She is alert and oriented to person, place, and time.  Nursing note and vitals reviewed.   Assessment & Plan  1. Cardiac murmur, previously undiagnosed Probably benign. Referral to cardiology for 2-D echocardiogram. - Ambulatory referral to Cardiology  2. Iron deficiency anemia Hemoglobin and hematocrit relatively stable. Repeat CBC. Continue OTC Fe supplements. - CBC with Differential  3. Essential hypertension BP at goal. Continue Norvasc. Refills provided. - amLODipine (NORVASC) 5  MG tablet; Take 1 tablet (5 mg total) by mouth daily.  Dispense: 90 tablet; Refill: 1   Karen Davila [facility] 04/06/2015 8:23 AM

## 2015-04-07 LAB — CBC WITH DIFFERENTIAL/PLATELET
BASOS: 1 %
Basophils Absolute: 0 10*3/uL (ref 0.0–0.2)
EOS (ABSOLUTE): 0.1 10*3/uL (ref 0.0–0.4)
EOS: 2 %
HEMATOCRIT: 32.2 % — AB (ref 34.0–46.6)
Hemoglobin: 10 g/dL — ABNORMAL LOW (ref 11.1–15.9)
IMMATURE GRANS (ABS): 0 10*3/uL (ref 0.0–0.1)
IMMATURE GRANULOCYTES: 0 %
Lymphocytes Absolute: 1.5 10*3/uL (ref 0.7–3.1)
Lymphs: 28 %
MCH: 25.5 pg — AB (ref 26.6–33.0)
MCHC: 31.1 g/dL — ABNORMAL LOW (ref 31.5–35.7)
MCV: 82 fL (ref 79–97)
MONOCYTES: 7 %
Monocytes Absolute: 0.4 10*3/uL (ref 0.1–0.9)
NEUTROS PCT: 62 %
Neutrophils Absolute: 3.3 10*3/uL (ref 1.4–7.0)
Platelets: 279 10*3/uL (ref 150–379)
RBC: 3.92 x10E6/uL (ref 3.77–5.28)
RDW: 15.7 % — ABNORMAL HIGH (ref 12.3–15.4)
WBC: 5.3 10*3/uL (ref 3.4–10.8)

## 2015-06-09 ENCOUNTER — Encounter: Payer: Self-pay | Admitting: Cardiovascular Disease

## 2015-06-09 ENCOUNTER — Ambulatory Visit (INDEPENDENT_AMBULATORY_CARE_PROVIDER_SITE_OTHER): Payer: Medicare PPO | Admitting: Cardiovascular Disease

## 2015-06-09 VITALS — BP 172/70 | HR 65 | Ht 65.0 in | Wt 152.8 lb

## 2015-06-09 DIAGNOSIS — R010 Benign and innocent cardiac murmurs: Secondary | ICD-10-CM | POA: Diagnosis not present

## 2015-06-09 DIAGNOSIS — R011 Cardiac murmur, unspecified: Secondary | ICD-10-CM

## 2015-06-09 DIAGNOSIS — I10 Essential (primary) hypertension: Secondary | ICD-10-CM | POA: Diagnosis not present

## 2015-06-09 NOTE — Progress Notes (Signed)
HPI  This is a pleasant 81 year old female who was referred by Dr. Sherryll BurgerShah for evaluation of a heart murmur. The patient has no previous cardiac history. She has known history of hypertension or hyperlipidemia. Although she is 81 years old, she is very functional and independent. She denies any chest pain or shortness of breath. She has no cardiac symptoms. She was found recently to have a faint cardiac murmur and thus she was referred for evaluation. She has no prior history of rheumatic fever. There is no family history of coronary artery disease or valvular disease. Her father lived to be 79 years old. She is not a smoker.  No Known Allergies   Current Outpatient Prescriptions on File Prior to Visit  Medication Sig Dispense Refill  . amLODipine (NORVASC) 5 MG tablet Take 1 tablet (5 mg total) by mouth daily. 90 tablet 1  . aspirin 81 MG tablet Take 1 tablet by mouth daily.    [name]. atorvastatin (LIPITOR) 10 MG tablet Take 10 mg by mouth daily.    . bisacodyl (DULCOLAX) 5 MG EC tablet Take by mouth.    . levothyroxine (SYNTHROID, LEVOTHROID) 50 MCG tablet Take 1 tablet (50 mcg total) by mouth daily before breakfast. 90 tablet 1  . lisinopril (PRINIVIL,ZESTRIL) 20 MG tablet Take 20 mg by mouth daily.    [name]. omeprazole (PRILOSEC) 20 MG capsule Take 20 mg by mouth daily.    [name]. senna (SENOKOT) 8.6 MG tablet Take 1 tablet by mouth daily.     No current facility-administered medications on file prior to visit.     Past Medical History  Diagnosis Date  . Hypertension   . Arthritis   . Hyperlipidemia   . Chronic kidney disease   . GERD (gastroesophageal reflux disease)      Past Surgical History  Procedure Laterality Date  . Eye surgery Right     Cataract     Family History  Problem Relation Age of Onset  . Cancer Brother      Social History   Social History  . Marital Status: Single    Spouse Name: N/A  . Number of Children: N/A  . Years of Education: N/A   Occupational  History  . Not on file.   Social History Main Topics  . Smoking status: Never Smoker   . Smokeless tobacco: Never Used  . Alcohol Use: No  . Drug Use: No  . Sexual Activity: No   Other Topics Concern  . Not on file   Social History Narrative     ROS A 10 point review of system was performed. It is negative other than that mentioned in the history of present illness.   PHYSICAL EXAM   BP 172/70 mmHg  Pulse 65  Ht 5\' 5"  (1.651 m)  Wt 152 lb 12 oz (69.287 kg)  BMI 25.42 kg/m2 Constitutional: She is oriented to person, place, and time. She appears well-developed and well-nourished. No distress.  HENT: No nasal discharge.  Head: Normocephalic and atraumatic.  Eyes: Pupils are equal and round. No discharge.  Neck: Normal range of motion. Neck supple. No JVD present. No thyromegaly present.  Cardiovascular: Normal rate, regular rhythm, normal heart sounds. Exam reveals no gallop and no friction rub. There is a 1/6 systolic ejection murmur in the aortic area with preserved S2  Pulmonary/Chest: Effort normal and breath sounds normal. No stridor. No respiratory distress. She has no wheezes. She has no rales. She exhibits no tenderness.  Abdominal: Soft. Bowel  sounds are normal. She exhibits no distension. There is no tenderness. There is no rebound and no guarding.  Musculoskeletal: Normal range of motion. She exhibits no edema and no tenderness.  Neurological: She is alert and oriented to person, place, and time. Coordination normal.  Skin: Skin is warm and dry. No rash noted. She is not diaphoretic. No erythema. No pallor.  Psychiatric: She has a normal mood and affect. Her behavior is normal. Judgment and thought content normal.     EKG: Normal sinus rhythm with first degree AV block.   ASSESSMENT AND PLAN

## 2015-06-09 NOTE — Patient Instructions (Signed)
Medication Instructions: No changes.  Labwork: None.   Procedures/Testing: None.   Follow-Up: As needed .   Any Additional Special Instructions Will Be Listed Below (If Applicable).   

## 2015-06-09 NOTE — Assessment & Plan Note (Signed)
Blood pressure is elevated today. She is currently on amlodipine and lisinopril. Continue to monitor and make adjustments as needed.

## 2015-06-09 NOTE — Assessment & Plan Note (Signed)
The patient has a very faint murmur in the aortic area suggestive of aortic sclerosis. By physical exam, there does not seem to be significant stenosis. She is completely asymptomatic and baseline ECG is unremarkable. Thus, I do not recommend any further cardiac workup. I don't think an echocardiogram will change her management. She can follow-up as needed.

## 2015-06-14 ENCOUNTER — Encounter: Payer: Self-pay | Admitting: Emergency Medicine

## 2015-06-14 ENCOUNTER — Emergency Department
Admission: EM | Admit: 2015-06-14 | Discharge: 2015-06-14 | Disposition: A | Discharge status: Home / Self Care | Payer: Medicare PPO | Attending: Emergency Medicine | Admitting: Emergency Medicine

## 2015-06-14 DIAGNOSIS — I872 Venous insufficiency (chronic) (peripheral): Secondary | ICD-10-CM | POA: Diagnosis not present

## 2015-06-14 DIAGNOSIS — R21 Rash and other nonspecific skin eruption: Secondary | ICD-10-CM | POA: Diagnosis present

## 2015-06-14 DIAGNOSIS — I129 Hypertensive chronic kidney disease with stage 1 through stage 4 chronic kidney disease, or unspecified chronic kidney disease: Secondary | ICD-10-CM | POA: Diagnosis not present

## 2015-06-14 DIAGNOSIS — Z7982 Long term (current) use of aspirin: Secondary | ICD-10-CM | POA: Diagnosis not present

## 2015-06-14 DIAGNOSIS — N189 Chronic kidney disease, unspecified: Secondary | ICD-10-CM | POA: Diagnosis not present

## 2015-06-14 DIAGNOSIS — L299 Pruritus, unspecified: Secondary | ICD-10-CM | POA: Diagnosis not present

## 2015-06-14 DIAGNOSIS — Z79899 Other long term (current) drug therapy: Secondary | ICD-10-CM | POA: Diagnosis not present

## 2015-06-14 MED ORDER — BETAMETHASONE DIPROPIONATE 0.05 % EX OINT: TOPICAL_OINTMENT | Freq: Two times a day (BID) | CUTANEOUS | Status: DC

## 2015-06-14 NOTE — ED Notes (Signed)
Pt explained follow up information and prescription given to grandson.

## 2015-06-14 NOTE — ED Notes (Signed)
Pt states bilateral legs are itching and have been that way for about a month. Right leg worse than left. No medications for itching. AOx4. In NAD.

## 2015-06-14 NOTE — ED Provider Notes (Signed)
[facility] Emergency Department Provider Note ?  ? ____________________________________________ ? Time seen: 11:49 AM ? I have reviewed the triage vital signs and the nursing notes.  ________ HISTORY ? Chief Complaint Rash and Pruritis     HPI  Karen Davila is a 82 y.o. female   who presents emergency department complaining of bilateral lower extremity pruritus. Patient states that symptoms have been present for approximately a month. She has not tried any medications to relieve itching. Patient does admit some minor, intermittent, bilateral lower extremity pain. She denies any history of PAD or PVD. She denies any chest pain, shortness of breath, abdominal pain. She denies any visible rash to lower extremity. ? ? ? Past Medical History  Diagnosis Date  . Hypertension   . Arthritis   . Hyperlipidemia   . Chronic kidney disease   . GERD (gastroesophageal reflux disease)     Patient Active Problem List   Diagnosis Date Noted  . Cardiac murmur, previously undiagnosed 04/06/2015  . Dementia 04/06/2015  . Difficulty hearing 04/06/2015  . OP (osteoporosis) 04/06/2015  . Hypertension 12/23/2014  . GERD (gastroesophageal reflux disease) 12/23/2014  . Adult hypothyroidism 12/23/2014  . Hyperlipidemia 12/23/2014  . Iron deficiency anemia 12/23/2014   ? Past Surgical History  Procedure Laterality Date  . Eye surgery Right     Cataract   ? Current Outpatient Rx  Name  Route  Sig  Dispense  Refill  . amLODipine (NORVASC) 5 MG tablet   Oral   Take 1 tablet (5 mg total) by mouth daily.   90 tablet   1   . aspirin 81 MG tablet   Oral   Take 1 tablet by mouth daily.         [name]. atorvastatin (LIPITOR) 10 MG tablet   Oral   Take 10 mg by mouth daily.         . betamethasone dipropionate (DIPROLENE) 0.05 % ointment   Topical   Apply topically 2 (two) times daily.   30 g   0   . bisacodyl (DULCOLAX) 5 MG EC tablet   Oral   Take by  mouth.         . ferrous sulfate 325 (65 FE) MG tablet   Oral   Take 325 mg by mouth daily with breakfast.         . levothyroxine (SYNTHROID, LEVOTHROID) 50 MCG tablet   Oral   Take 1 tablet (50 mcg total) by mouth daily before breakfast.   90 tablet   1   . lisinopril (PRINIVIL,ZESTRIL) 20 MG tablet   Oral   Take 20 mg by mouth daily.         [name]. omeprazole (PRILOSEC) 20 MG capsule   Oral   Take 20 mg by mouth daily.         [name]. senna (SENOKOT) 8.6 MG tablet   Oral   Take 1 tablet by mouth daily.          ? Allergies Review of patient's allergies indicates no known allergies. ? Family History  Problem Relation Age of Onset  . Cancer Brother    ? Social History Social History  Substance Use Topics  . Smoking status: Never Smoker   . Smokeless tobacco: Never Used  . Alcohol Use: No   ? Review of Systems Constitutional: no fever. Eyes: no discharge ENT: no sore throat. Cardiovascular: no chest pain. Respiratory: no cough. No sob Gastrointestinal: denies abdominal pain, vomiting, diarrhea,  and constipation Genitourinary: no dysuria. Negative for hematuria Musculoskeletal: Negative for back pain. Skin: Negative for rash. Endorses "itching" to bilateral lower extremities. Neurological: Negative for headaches  10-point ROS otherwise negative.  _______________ PHYSICAL EXAM: ? VITAL SIGNS:   ED Triage Vitals  Enc Vitals Group     BP 06/14/15 0943 156/60 mmHg     Pulse Rate 06/14/15 0943 70     Resp 06/14/15 0943 18     Temp 06/14/15 0943 97.7 F (36.5 C)     Temp Source 06/14/15 0943 Oral     SpO2 06/14/15 0943 98 %     Weight 06/14/15 0943 156 lb (70.761 kg)     Height 06/14/15 0943 5\' 5"  (1.651 m)     Head Cir --      Peak Flow --      Pain Score 06/14/15 0943 0     Pain Loc --      Pain Edu? --      Excl. in GC? --    ?  Constitutional: Alert and oriented. Well appearing and in no distress. Eyes: Conjunctivae are normal.  ENT       Head: Normocephalic and atraumatic.      Ears:       Nose: No congestion/rhinnorhea.      Mouth/Throat: Mucous membranes are moist.   Hematological/Lymphatic/Immunilogical: No cervical lymphadenopathy. Cardiovascular: Normal rate, regular rhythm. Normal S1 and S2. Pulses are present bilateral radially. Pulses were not palpated bilateral extremities; popliteal, posterior tibialis, and dorsalis pedis were all palpated. Bilateral equal 2+ edema to feet. Skin is cool to touch but not cold to touch. Respiratory: Normal respiratory effort without tachypnea nor retractions. Lungs CTAB. Gastrointestinal: Soft and nontender. No distention. There is no CVA tenderness. Genitourinary:  Musculoskeletal: Nontender with normal range of motion in all extremities.  Neurologic:  Normal speech and language. No gross focal neurologic deficits are appreciated. Skin:  Skin is warm, dry and intact. No rash noted. scattered, round, scaly skin patches.  Psychiatric: Mood and affect are normal. Speech and behavior are normal. Patient exhibits appropriate insight and judgment.    LABS (all labs ordered are listed, but only abnormal results are displayed)  Labs Reviewed - No data to display  ___________ RADIOLOGY    _____________ PROCEDURES ? Procedure(s) performed:    Medications - No data to display  ______________________________________________________ INITIAL IMPRESSION / ASSESSMENT AND PLAN / ED COURSE ? Pertinent labs & imaging results that were available during my care of the patient were reviewed by me and considered in my medical decision making (see chart for details).    Patient presented to the emergency department with complaint of bilateral lower extremity pruritus. Exam revealed no rash, but did reveal eczematous patches bilateral lower extremity that are consistent with stasis dermatitis. Patient did not have a history of peripheral vascular disease, peripheral artery disease, congestive  heart failure. There was concern when pulses were not palpated bilaterally. Bedside, Doppler ultrasound reveals faint pulses in the bilateral dorsalis pedis region. The patient's findings, symptoms, physical exam are discussed with Dr. Huel Cote. At this time with the findings of stasis dermatitis in patient's symptoms this is considered a chronic versus acute issue. Patient is mentally sound and the treatment plan is discussed with her. The patient is to be referred to vascular surgeons for further evaluation and treatment. Strict ED precautions are given to patient and family members to return to the emergency department for any sign on increase of symptoms, leg pain,  chest pain, shortness of breath. They verbalized understanding of same and verbalized they will follow up with vascular.    Discharge Medication List as of 06/14/2015 10:42 AM    START taking these medications   Details  betamethasone dipropionate (DIPROLENE) 0.05 % ointment Apply topically 2 (two) times daily., Starting 06/14/2015, Until Discontinued, Print       ____________________________________________ FINAL CLINICAL IMPRESSION(S) / ED DIAGNOSES?  Final diagnoses:  Venous stasis dermatitis of both lower extremities            Racheal Patches, PA-C 06/14/15 1149  Jennye Moccasin, MD 06/14/15 [phone]

## 2015-06-14 NOTE — Discharge Instructions (Signed)
Stasis Dermatitis Stasis dermatitis occurs when veins lose the ability to pump blood back to the heart (poor venous circulation). It causes a reddish-purple to brownish scaly, itchy rash on the legs. The rash comes from pooling of blood (stasis). CAUSES  This occurs because the veins do not work very well anymore or because pressure may be increased in the veins due to other conditions. With blood pooling, the increased pressure in the tiny blood vessels (capillaries) causes fluid to leak out of the capillaries into the tissue. The extra fluid makes it harder for the blood to feed the cells and get rid of waste products. SYMPTOMS  Stasis dermatitis appears as red, scaly, itchy patches on the legs. A yellowish or light brown discoloration is also present. Due to scratching or other injury, these patches can become an ulcer. This ulcer may remain for long periods of time. The ulcer can also become infected. Swelling of the legs is often present with stasis dermatitis. If the leg is swollen, this increases the risk of infection and further damage to the skin. Sometimes, intense itching, tingling, and burning occurs before signs of stasis dermatitis appear. You may find yourself scratching the insides of your ankles or rubbing your ankles together before the rash appears. After healing, there are often brown spots on the affected skin. DIAGNOSIS  Your caregiver makes this diagnosis based on an exam. Other tests may be done to better understand the cause. TREATMENT  If underlying conditions are present, they must be treated. Some of these conditions are heart failure, thyroid problems, poor nutrition, and varicose veins.  Cortisone creams and ointments applied to the skin (topically) may be needed, as well as medicine to reduce swelling in the legs (diuretics).  Compression stockings or an elastic wrap may also be needed to reduce swelling.  If there is an infection, antibiotic medicines may also be  used. HOME CARE INSTRUCTIONS   Try to rest and raise (elevate) the affected leg above the level of the heart, if possible.  Burow's solution wet packs applied for 30 minutes, 3 times daily, will help the weepy rash. Stop using the packs before your skin gets too dry. You can also use a mixture of 3 parts white vinegar to 1 quart water.  Grease your legs daily with ointments, such as petroleum jelly, to fight dryness.  Avoid scratching or injuring the area. SEEK IMMEDIATE MEDICAL CARE IF:   Your rash gets worse.  An ulcer forms.  You have an oral temperature above 102 F (38.9 C), not controlled by medicine.  You have any other severe symptoms.   This information is not intended to replace advice given to you by your health care provider. Make sure you discuss any questions you have with your health care provider.   Document Released: 09/08/2005 Document Revised: 08/22/2011 Document Reviewed: 10/15/2014 Elsevier Interactive Patient Education 2016 [employer]  

## 2015-07-03 ENCOUNTER — Emergency Department: Payer: Medicare PPO

## 2015-07-03 ENCOUNTER — Emergency Department
Admission: EM | Admit: 2015-07-03 | Discharge: 2015-07-03 | Disposition: A | Discharge status: Home / Self Care | Payer: Medicare PPO | Attending: Emergency Medicine | Admitting: Emergency Medicine

## 2015-07-03 ENCOUNTER — Encounter: Payer: Self-pay | Admitting: Emergency Medicine

## 2015-07-03 DIAGNOSIS — Z7982 Long term (current) use of aspirin: Secondary | ICD-10-CM | POA: Diagnosis not present

## 2015-07-03 DIAGNOSIS — Z7952 Long term (current) use of systemic steroids: Secondary | ICD-10-CM | POA: Diagnosis not present

## 2015-07-03 DIAGNOSIS — N189 Chronic kidney disease, unspecified: Secondary | ICD-10-CM | POA: Diagnosis not present

## 2015-07-03 DIAGNOSIS — Z79899 Other long term (current) drug therapy: Secondary | ICD-10-CM | POA: Insufficient documentation

## 2015-07-03 DIAGNOSIS — I129 Hypertensive chronic kidney disease with stage 1 through stage 4 chronic kidney disease, or unspecified chronic kidney disease: Secondary | ICD-10-CM | POA: Insufficient documentation

## 2015-07-03 DIAGNOSIS — K5900 Constipation, unspecified: Secondary | ICD-10-CM | POA: Insufficient documentation

## 2015-07-03 LAB — CBC
HCT: 34 % — ABNORMAL LOW (ref 35.0–47.0)
Hemoglobin: 10.5 g/dL — ABNORMAL LOW (ref 12.0–16.0)
MCH: 25.3 pg — ABNORMAL LOW (ref 26.0–34.0)
MCHC: 30.8 g/dL — ABNORMAL LOW (ref 32.0–36.0)
MCV: 82.2 fL (ref 80.0–100.0)
PLATELETS: 211 10*3/uL (ref 150–440)
RBC: 4.13 MIL/uL (ref 3.80–5.20)
RDW: 15.1 % — ABNORMAL HIGH (ref 11.5–14.5)
WBC: 5.5 10*3/uL (ref 3.6–11.0)

## 2015-07-03 LAB — COMPREHENSIVE METABOLIC PANEL
ALK PHOS: 54 U/L (ref 38–126)
ALT: 20 U/L (ref 14–54)
AST: 35 U/L (ref 15–41)
Albumin: 4.3 g/dL (ref 3.5–5.0)
Anion gap: 9 (ref 5–15)
BUN: 20 mg/dL (ref 6–20)
CALCIUM: 9.7 mg/dL (ref 8.9–10.3)
CO2: 32 mmol/L (ref 22–32)
CREATININE: 1.55 mg/dL — AB (ref 0.44–1.00)
Chloride: 104 mmol/L (ref 101–111)
GFR, EST AFRICAN AMERICAN: 32 mL/min — AB (ref >60)
GFR, EST NON AFRICAN AMERICAN: 27 mL/min — AB (ref >60)
Glucose, Bld: 107 mg/dL — ABNORMAL HIGH (ref 65–99)
Potassium: 4 mmol/L (ref 3.5–5.1)
Sodium: 145 mmol/L (ref 135–145)
TOTAL PROTEIN: 7.8 g/dL (ref 6.5–8.1)
Total Bilirubin: 0.5 mg/dL (ref 0.3–1.2)

## 2015-07-03 MED ORDER — LACTULOSE 10 GM/15ML PO SOLN
10.0000 g | Freq: Once | ORAL | Status: AC
  Administered 2015-07-03: 10 g via ORAL

## 2015-07-03 MED ORDER — LACTULOSE 10 GM/15ML PO SOLN
ORAL | Status: AC
  Administered 2015-07-03: 10 g via ORAL
  Filled 2015-07-03: qty 30

## 2015-07-03 MED ORDER — MAGNESIUM CITRATE PO SOLN
ORAL | Status: AC
  Administered 2015-07-03: 0.5 via ORAL
  Filled 2015-07-03: qty 296

## 2015-07-03 MED ORDER — DOCUSATE SODIUM 100 MG PO CAPS
100.0000 mg | ORAL_CAPSULE | Freq: Once | ORAL | Status: AC
  Administered 2015-07-03: 100 mg via ORAL

## 2015-07-03 MED ORDER — MAGNESIUM CITRATE PO SOLN
0.5000 | Freq: Once | ORAL | Status: AC
  Administered 2015-07-03: 0.5 via ORAL

## 2015-07-03 MED ORDER — DOCUSATE SODIUM 100 MG PO CAPS
ORAL_CAPSULE | ORAL | Status: AC
  Administered 2015-07-03: 100 mg via ORAL
  Filled 2015-07-03: qty 1

## 2015-07-03 NOTE — ED Notes (Signed)
No BM resulting from enema, pt states that she does feel fine.

## 2015-07-03 NOTE — Discharge Instructions (Signed)

## 2015-07-03 NOTE — ED Notes (Signed)
MD at bedside. 

## 2015-07-03 NOTE — ED Notes (Signed)
Pt alert and oriented X4, active, cooperative, pt in NAD. RR even and unlabored, color WNL.  Pt informed to return if any life threatening symptoms occur.   

## 2015-07-03 NOTE — ED Notes (Signed)
Pt has some problems with constipation and takes medication at night to help her go.  Reports some dark red blood in stool last night.  Unsure if pt here for constipation as well.  Reports sometimes she goes sometimes she doesn't but family reports here because of blood

## 2015-07-03 NOTE — ED Notes (Signed)
Pt reports dark blood BM X 1 last night. Pt reports moderate amount of blood with BM last night which was diarrhea. No NVD since. Denies abdominal pain. Pt alert and oriented X4, active, cooperative, pt in NAD. RR even and unlabored, color WNL.

## 2015-07-03 NOTE — ED Provider Notes (Signed)
[facility] Emergency Department Provider Note  ____________________________________________  Time seen: Approximately 12:15 PM  I have reviewed the triage vital signs and the nursing notes.   HISTORY  Chief Complaint Constipation    HPI Karen Davila is a 82 y.o. female with a history of chronic kidney disease and hypertension who is presenting with constipation. She says that she has not had a normal bowel movement in about a week. She said that she did take some magnesium citrate and had some liquid-like stool last night that she thought may have some blood in it. She is denying any pain at this time. Denies any nausea or vomiting. Denies any burning with urination or difficulty urinating. Says that she has a history of constipation.   Past Medical History  Diagnosis Date  . Hypertension   . Arthritis   . Hyperlipidemia   . Chronic kidney disease   . GERD (gastroesophageal reflux disease)     Patient Active Problem List   Diagnosis Date Noted  . Cardiac murmur, previously undiagnosed 04/06/2015  . Dementia 04/06/2015  . Difficulty hearing 04/06/2015  . OP (osteoporosis) 04/06/2015  . Hypertension 12/23/2014  . GERD (gastroesophageal reflux disease) 12/23/2014  . Adult hypothyroidism 12/23/2014  . Hyperlipidemia 12/23/2014  . Iron deficiency anemia 12/23/2014    Past Surgical History  Procedure Laterality Date  . Eye surgery Right     Cataract    Current Outpatient Rx  Name  Route  Sig  Dispense  Refill  . amLODipine (NORVASC) 5 MG tablet   Oral   Take 1 tablet (5 mg total) by mouth daily.   90 tablet   1   . aspirin 81 MG tablet   Oral   Take 1 tablet by mouth daily.         [name] atorvastatin (LIPITOR) 10 MG tablet   Oral   Take 10 mg by mouth daily at 6 PM.          . betamethasone dipropionate (DIPROLENE) 0.05 % ointment   Topical   Apply topically 2 (two) times daily.   30 g   0   . bisacodyl (DULCOLAX) 5 MG EC  tablet   Oral   Take 5 mg by mouth daily as needed.          . ferrous sulfate 325 (65 FE) MG tablet   Oral   Take 325 mg by mouth daily with breakfast.         . levothyroxine (SYNTHROID, LEVOTHROID) 50 MCG tablet   Oral   Take 1 tablet (50 mcg total) by mouth daily before breakfast.   90 tablet   1   . omeprazole (PRILOSEC) 20 MG capsule   Oral   Take 20 mg by mouth daily.         [name] senna (SENOKOT) 8.6 MG tablet   Oral   Take 1 tablet by mouth daily.           Allergies Review of patient's allergies indicates no known allergies.  Family History  Problem Relation Age of Onset  . Cancer Brother     Social History Social History  Substance Use Topics  . Smoking status: Never Smoker   . Smokeless tobacco: Never Used  . Alcohol Use: No    Review of Systems Constitutional: No fever/chills Eyes: No visual changes. ENT: No sore throat. Cardiovascular: Denies chest pain. Respiratory: Denies shortness of breath. Gastrointestinal: No abdominal pain.  No nausea, no vomiting.  No  diarrhea.   Genitourinary: Negative for dysuria. Musculoskeletal: Negative for back pain. Skin: Negative for rash. Neurological: Negative for headaches, focal weakness or numbness.  10-point ROS otherwise negative.  ____________________________________________   PHYSICAL EXAM:  VITAL SIGNS: ED Triage Vitals  Enc Vitals Group     BP 07/03/15 1033 157/61 mmHg     Pulse Rate 07/03/15 1033 80     Resp 07/03/15 1033 18     Temp 07/03/15 1033 98.5 F (36.9 C)     Temp Source 07/03/15 1033 Oral     SpO2 07/03/15 1033 96 %     Weight 07/03/15 1033 155 lb (70.308 kg)     Height 07/03/15 1033  (1.651 m)     Head Cir --      Peak Flow --      Pain Score --      Pain Loc --      Pain Edu? --      Excl. in GC? --     Constitutional: Alert and oriented. Well appearing and in no acute distress. Eyes: Conjunctivae are normal. PERRL. EOMI. Head: Atraumatic. Nose: No  congestion/rhinnorhea. Mouth/Throat: Mucous membranes are moist.  Oropharynx non-erythematous. Neck: No stridor.   Cardiovascular: Normal rate, regular rhythm. Grossly normal heart sounds.  Good peripheral circulation. Respiratory: Normal respiratory effort.  No retractions. Lungs CTAB. Gastrointestinal: Soft and nontender. No distention. No abdominal bruits. No CVA tenderness. Rectal exam without fecal impaction. Musculoskeletal: No lower extremity tenderness nor edema.  No joint effusions. Neurologic:  Normal speech and language. No gross focal neurologic deficits are appreciated. No gait instability. Skin:  Skin is warm, dry and intact. No rash noted. Psychiatric: Mood and affect are normal. Speech and behavior are normal.  ____________________________________________   LABS (all labs ordered are listed, but only abnormal results are displayed)  Labs Reviewed  COMPREHENSIVE METABOLIC PANEL - Abnormal; Notable for the following:    Glucose, Bld 107 (*)    Creatinine, Ser 1.55 (*)    GFR calc non Af Amer 27 (*)    GFR calc Af Amer 32 (*)    All other components within normal limits  CBC - Abnormal; Notable for the following:    Hemoglobin 10.5 (*)    HCT 34.0 (*)    MCH 25.3 (*)    MCHC 30.8 (*)    RDW 15.1 (*)    All other components within normal limits  POC OCCULT BLOOD, ED   ____________________________________________  EKG   ____________________________________________  RADIOLOGY  Nonobstructive bowel gas pattern with a moderate amount of stool volume. No definitive constipation. ____________________________________________   PROCEDURES   ____________________________________________   INITIAL IMPRESSION / ASSESSMENT AND PLAN / ED COURSE  Pertinent labs & imaging results that were available during my care of the patient were reviewed by me and considered in my medical decision making (see chart for details).  ----------------------------------------- 2:31  PM on 07/03/2015 -----------------------------------------  Patient had her enema but, per the nurse she only had "water" returned. There did not appear to be any formed stool. Despite this, the patient has no complaints at this time. I reexamined her abdomen and is completely nontender. She is with her sister who is at the bedside and she says that she is acting normal and at her baseline. I will not pursue further lab work or imaging at this time. I discussed with the patient the plan for discharge as well as with her sister and they are in agreement. We discussed this  plan of the condition that if any of the symptoms are worsening that she should return to the hospital immediately. She also has follow-up this coming Tuesday with her primary care doctor at the [facility]. The patient as well as her sister understand the plan and are willing to comply. She will continue her as needed laxatives at home. She continues to deny any nausea. ____________________________________________   FINAL CLINICAL IMPRESSION(S) / ED DIAGNOSES  Constipation.    Myrna Blazer, MD 07/03/15 [phone]

## 2015-07-07 ENCOUNTER — Ambulatory Visit (INDEPENDENT_AMBULATORY_CARE_PROVIDER_SITE_OTHER): Payer: Medicare PPO | Admitting: Family Medicine

## 2015-07-07 ENCOUNTER — Encounter: Payer: Self-pay | Admitting: Family Medicine

## 2015-07-07 VITALS — BP 140/78 | HR 89 | Temp 98.3°F | Resp 19 | Ht 65.0 in | Wt 150.1 lb

## 2015-07-07 DIAGNOSIS — D509 Iron deficiency anemia, unspecified: Secondary | ICD-10-CM

## 2015-07-07 DIAGNOSIS — N183 Chronic kidney disease, stage 3 unspecified: Secondary | ICD-10-CM

## 2015-07-07 DIAGNOSIS — K219 Gastro-esophageal reflux disease without esophagitis: Secondary | ICD-10-CM

## 2015-07-07 DIAGNOSIS — I10 Essential (primary) hypertension: Secondary | ICD-10-CM

## 2015-07-07 MED ORDER — AMLODIPINE BESYLATE 5 MG PO TABS
5.0000 mg | ORAL_TABLET | Freq: Every day | ORAL | Status: DC
Start: 2015-07-07 — End: 2016-01-04

## 2015-07-07 MED ORDER — OMEPRAZOLE 20 MG PO CPDR: 20.0000 mg | DELAYED_RELEASE_CAPSULE | Freq: Every day | ORAL | Status: DC

## 2015-07-07 NOTE — Progress Notes (Signed)
Name: Karen Davila   MRN: [id number]    DOB: 03-Jan-1918   Date:07/07/2015       Progress Note  Subjective  Chief Complaint  Chief Complaint  Patient presents with  . Follow-up    3 mo  . Hypertension  . Gastroesophageal Reflux  . Hyperlipidemia    Hypertension This is a chronic problem. The problem is controlled. Pertinent negatives include no blurred vision, chest pain, headaches, malaise/fatigue or shortness of breath. Past treatments include calcium channel blockers. Hypertensive end-organ damage includes kidney disease. There is no history of CAD/MI or CVA. Identifiable causes of hypertension include chronic renal disease.  Gastroesophageal Reflux She reports no abdominal pain, no belching, no chest pain, no dysphagia, no heartburn or no nausea. This is a chronic problem. The problem has been unchanged. She has tried a PPI for the symptoms. The treatment provided significant relief.  Anemia Presents for follow-up visit. There has been no abdominal pain, bruising/bleeding easily, fever, light-headedness or malaise/fatigue. Past treatments include oral iron supplements. Past medical history includes chronic renal disease and hypothyroidism. There is no history of malnutrition.      Past Medical History  Diagnosis Date  . Hypertension   . Arthritis   . Hyperlipidemia   . Chronic kidney disease   . GERD (gastroesophageal reflux disease)     Past Surgical History  Procedure Laterality Date  . Eye surgery Right     Cataract    Family History  Problem Relation Age of Onset  . Cancer Brother     Social History   Social History  . Marital Status: Single    Spouse Name: N/A  . Number of Children: N/A  . Years of Education: N/A   Occupational History  . Not on file.   Social History Main Topics  . Smoking status: Never Smoker   . Smokeless tobacco: Never Used  . Alcohol Use: No  . Drug Use: No  . Sexual Activity: No   Other Topics Concern  . Not on file    Social History Narrative     Current outpatient prescriptions:  .  amLODipine (NORVASC) 5 MG tablet, Take 1 tablet (5 mg total) by mouth daily., Disp: 90 tablet, Rfl: 1 .  aspirin 81 MG tablet, Take 1 tablet by mouth daily., Disp: , Rfl:  .  atorvastatin (LIPITOR) 10 MG tablet, Take 10 mg by mouth daily at 6 PM. , Disp: , Rfl:  .  betamethasone dipropionate (DIPROLENE) 0.05 % ointment, Apply topically 2 (two) times daily., Disp: 30 g, Rfl: 0 .  bisacodyl (DULCOLAX) 5 MG EC tablet, Take 5 mg by mouth daily as needed. , Disp: , Rfl:  .  ferrous sulfate 325 (65 FE) MG tablet, Take 325 mg by mouth daily with breakfast., Disp: , Rfl:  .  levothyroxine (SYNTHROID, LEVOTHROID) 50 MCG tablet, Take 1 tablet (50 mcg total) by mouth daily before breakfast., Disp: 90 tablet, Rfl: 1 .  omeprazole (PRILOSEC) 20 MG capsule, Take 20 mg by mouth daily., Disp: , Rfl:  .  senna (SENOKOT) 8.6 MG tablet, Take 1 tablet by mouth daily., Disp: , Rfl:   No Known Allergies   Review of Systems  Constitutional: Negative for fever, chills and malaise/fatigue.  Eyes: Negative for blurred vision and double vision.  Respiratory: Negative for shortness of breath.   Cardiovascular: Negative for chest pain.  Gastrointestinal: Positive for constipation. Negative for heartburn, dysphagia, nausea, abdominal pain and blood in stool.  Genitourinary: Negative for hematuria.  Neurological: Negative for light-headedness and headaches.  Endo/Heme/Allergies: Does not bruise/bleed easily.    Objective  Filed Vitals:   07/07/15 0814  BP: 140/78  Pulse: 89  Temp: 98.3 F (36.8 C)  TempSrc: Oral  Resp: 19  Height:  (1.651 m)  Weight: 150 lb 1.6 oz (68.085 kg)  SpO2: 95%    Physical Exam  Constitutional: She is oriented to person, place, and time and well-developed, well-nourished, and in no distress.  HENT:  Head: Normocephalic and atraumatic.  Cardiovascular: Normal rate and regular rhythm.   Murmur  heard.  Systolic murmur is present  Pulmonary/Chest: Effort normal and breath sounds normal.  Abdominal: Soft. Bowel sounds are normal.  Musculoskeletal: She exhibits no edema.  Neurological: She is alert and oriented to person, place, and time.  Psychiatric: Mood, memory, affect and judgment normal.  Nursing note and vitals reviewed.     Assessment & Plan  1. Gastroesophageal reflux disease, esophagitis presence not specified  - omeprazole (PRILOSEC) 20 MG capsule; Take 1 capsule (20 mg total) by mouth daily.  Dispense: 90 capsule; Refill: 1  2. Iron deficiency anemia  - CBC with Differential - Iron - Iron Binding Cap (TIBC) - Ferritin  3. Essential hypertension  - amLODipine (NORVASC) 5 MG tablet; Take 1 tablet (5 mg total) by mouth daily.  Dispense: 90 tablet; Refill: 1  4. Chronic kidney disease, stage 3  - Comprehensive Metabolic Panel (CMET)   [name] [name] 07/07/2015 9:01 AM

## 2015-07-08 LAB — CBC WITH DIFFERENTIAL/PLATELET
Basophils Absolute: 0 10*3/uL (ref 0.0–0.2)
Basos: 1 %
EOS (ABSOLUTE): 0.1 10*3/uL (ref 0.0–0.4)
Eos: 1 %
Hematocrit: 31.4 % — ABNORMAL LOW (ref 34.0–46.6)
Hemoglobin: 9.9 g/dL — ABNORMAL LOW (ref 11.1–15.9)
IMMATURE GRANULOCYTES: 0 %
Immature Grans (Abs): 0 10*3/uL (ref 0.0–0.1)
LYMPHS: 28 %
Lymphocytes Absolute: 1.5 10*3/uL (ref 0.7–3.1)
MCH: 25.5 pg — AB (ref 26.6–33.0)
MCHC: 31.5 g/dL (ref 31.5–35.7)
MCV: 81 fL (ref 79–97)
MONOS ABS: 0.5 10*3/uL (ref 0.1–0.9)
Monocytes: 9 %
Neutrophils Absolute: 3.4 10*3/uL (ref 1.4–7.0)
Neutrophils: 61 %
PLATELETS: 227 10*3/uL (ref 150–379)
RBC: 3.88 x10E6/uL (ref 3.77–5.28)
RDW: 15.7 % — AB (ref 12.3–15.4)
WBC: 5.5 10*3/uL (ref 3.4–10.8)

## 2015-07-08 LAB — COMPREHENSIVE METABOLIC PANEL
A/G RATIO: 1.5 (ref 1.1–2.5)
ALT: 16 IU/L (ref 0–32)
AST: 25 IU/L (ref 0–40)
Albumin: 4.1 g/dL (ref 3.2–4.6)
Alkaline Phosphatase: 57 IU/L (ref 39–117)
BILIRUBIN TOTAL: 0.3 mg/dL (ref 0.0–1.2)
BUN/Creatinine Ratio: 12 (ref 11–26)
BUN: 14 mg/dL (ref 10–36)
CALCIUM: 9.5 mg/dL (ref 8.7–10.3)
CO2: 27 mmol/L (ref 18–29)
Chloride: 105 mmol/L (ref 96–106)
Creatinine, Ser: 1.13 mg/dL — ABNORMAL HIGH (ref 0.57–1.00)
GFR calc Af Amer: 48 mL/min/{1.73_m2} — ABNORMAL LOW (ref >59)
GFR calc non Af Amer: 41 mL/min/{1.73_m2} — ABNORMAL LOW (ref >59)
GLOBULIN, TOTAL: 2.7 g/dL (ref 1.5–4.5)
Glucose: 89 mg/dL (ref 65–99)
POTASSIUM: 4.5 mmol/L (ref 3.5–5.2)
SODIUM: 145 mmol/L — AB (ref 134–144)
Total Protein: 6.8 g/dL (ref 6.0–8.5)

## 2015-07-08 LAB — IRON AND TIBC
IRON: 49 ug/dL (ref 27–139)
Iron Saturation: 16 % (ref 15–55)
Total Iron Binding Capacity: 297 ug/dL (ref 250–450)
UIBC: 248 ug/dL (ref 118–369)

## 2015-07-08 LAB — FERRITIN: Ferritin: 127 ng/mL (ref 15–150)

## 2015-07-18 ENCOUNTER — Other Ambulatory Visit: Payer: Self-pay | Admitting: Family Medicine

## 2015-10-21 ENCOUNTER — Other Ambulatory Visit: Payer: Self-pay | Admitting: Family Medicine

## 2015-10-28 ENCOUNTER — Telehealth: Payer: Self-pay

## 2015-10-28 MED ORDER — ATORVASTATIN CALCIUM 10 MG PO TABS: 10.0000 mg | ORAL_TABLET | Freq: Every day | ORAL | Status: DC

## 2015-10-28 NOTE — Telephone Encounter (Signed)
Medication has been refilled and sent to [facility] [address] ° °

## 2015-12-23 ENCOUNTER — Other Ambulatory Visit: Payer: Self-pay | Admitting: *Deleted

## 2015-12-23 NOTE — Patient Outreach (Signed)
[facility]) Care Management  12/23/2015  Karen DoneMollie J Wiginton 11/16/1917 [id number]  RN Health Coach  Attempted screening  outreach call to patient.  Patient was unavailable. No voice mail pickup. Plan: RN will call patient again within 10 days.  [name] BSN RN [facility] Care Management [phone]

## 2015-12-28 ENCOUNTER — Ambulatory Visit: Payer: Self-pay | Admitting: *Deleted

## 2015-12-31 ENCOUNTER — Other Ambulatory Visit: Payer: Self-pay | Admitting: *Deleted

## 2015-12-31 NOTE — Patient Outreach (Signed)
[facility]) Care Management  12/31/2015  Karen Davila 06/16/1917 [id number]  RN Health Coach  attempted  2nd  outreach screening  call to patient.  Patient was unavailable. No  voicemail message was left . No answering machine pickup. Plan: RN will call patient again within 14 days.     Karen Davila BSN RN [facility] Care Management [phone]

## 2016-01-04 ENCOUNTER — Encounter: Payer: Self-pay | Admitting: Family Medicine

## 2016-01-04 ENCOUNTER — Ambulatory Visit (INDEPENDENT_AMBULATORY_CARE_PROVIDER_SITE_OTHER): Payer: Medicare PPO | Admitting: Family Medicine

## 2016-01-04 VITALS — BP 124/62 | HR 81 | Temp 97.2°F | Resp 16 | Ht 65.0 in | Wt 150.3 lb

## 2016-01-04 DIAGNOSIS — D509 Iron deficiency anemia, unspecified: Secondary | ICD-10-CM

## 2016-01-04 DIAGNOSIS — E785 Hyperlipidemia, unspecified: Secondary | ICD-10-CM | POA: Diagnosis not present

## 2016-01-04 DIAGNOSIS — I10 Essential (primary) hypertension: Secondary | ICD-10-CM | POA: Diagnosis not present

## 2016-01-04 DIAGNOSIS — E038 Other specified hypothyroidism: Secondary | ICD-10-CM | POA: Diagnosis not present

## 2016-01-04 DIAGNOSIS — K219 Gastro-esophageal reflux disease without esophagitis: Secondary | ICD-10-CM

## 2016-01-04 LAB — CBC WITH DIFFERENTIAL/PLATELET
BASOS PCT: 1 %
Basophils Absolute: 44 cells/uL (ref 0–200)
EOS PCT: 1 %
Eosinophils Absolute: 44 cells/uL (ref 15–500)
HEMATOCRIT: 32.6 % — AB (ref 35.0–45.0)
HEMOGLOBIN: 10.2 g/dL — AB (ref 11.7–15.5)
LYMPHS ABS: 1364 {cells}/uL (ref 850–3900)
Lymphocytes Relative: 31 %
MCH: 25.5 pg — ABNORMAL LOW (ref 27.0–33.0)
MCHC: 31.3 g/dL — ABNORMAL LOW (ref 32.0–36.0)
MCV: 81.5 fL (ref 80.0–100.0)
MONO ABS: 264 {cells}/uL (ref 200–950)
MPV: 9.8 fL (ref 7.5–12.5)
Monocytes Relative: 6 %
NEUTROS ABS: 2684 {cells}/uL (ref 1500–7800)
Neutrophils Relative %: 61 %
Platelets: 255 10*3/uL (ref 140–400)
RBC: 4 MIL/uL (ref 3.80–5.10)
RDW: 15.6 % — ABNORMAL HIGH (ref 11.0–15.0)
WBC: 4.4 10*3/uL (ref 3.8–10.8)

## 2016-01-04 LAB — COMPLETE METABOLIC PANEL WITH GFR
ALT: 22 U/L (ref 6–29)
AST: 35 U/L (ref 10–35)
Albumin: 4.1 g/dL (ref 3.6–5.1)
Alkaline Phosphatase: 55 U/L (ref 33–130)
BUN: 15 mg/dL (ref 7–25)
CALCIUM: 9.8 mg/dL (ref 8.6–10.4)
CHLORIDE: 107 mmol/L (ref 98–110)
CO2: 26 mmol/L (ref 20–31)
CREATININE: 1.11 mg/dL — AB (ref 0.60–0.88)
GFR, Est African American: 48 mL/min — ABNORMAL LOW (ref >=60)
GFR, Est Non African American: 42 mL/min — ABNORMAL LOW (ref >=60)
GLUCOSE: 94 mg/dL (ref 65–99)
Potassium: 4.7 mmol/L (ref 3.5–5.3)
Sodium: 141 mmol/L (ref 135–146)
Total Bilirubin: 0.4 mg/dL (ref 0.2–1.2)
Total Protein: 6.7 g/dL (ref 6.1–8.1)

## 2016-01-04 LAB — LIPID PANEL
CHOLESTEROL: 138 mg/dL (ref 125–200)
HDL: 52 mg/dL (ref >=46)
LDL CALC: 58 mg/dL (ref <130)
Total CHOL/HDL Ratio: 2.7 Ratio (ref <=5.0)
Triglycerides: 140 mg/dL (ref <150)
VLDL: 28 mg/dL (ref <30)

## 2016-01-04 LAB — TSH: TSH: 3.44 mIU/L

## 2016-01-04 LAB — RETICULOCYTES
ABS Retic: 36000 cells/uL (ref 20000–80000)
RBC.: 4 MIL/uL (ref 3.80–5.10)
RETIC CT PCT: 0.9 %

## 2016-01-04 LAB — IRON AND TIBC
%SAT: 20 % (ref 11–50)
IRON: 61 ug/dL (ref 45–160)
TIBC: 301 ug/dL (ref 250–450)
UIBC: 240 ug/dL (ref 125–400)

## 2016-01-04 LAB — FERRITIN: FERRITIN: 159 ng/mL (ref 20–288)

## 2016-01-04 MED ORDER — AMLODIPINE BESYLATE 5 MG PO TABS: 5.0000 mg | ORAL_TABLET | Freq: Every day | ORAL | 1 refills | Status: DC

## 2016-01-04 MED ORDER — LISINOPRIL 20 MG PO TABS: 20.0000 mg | ORAL_TABLET | Freq: Every day | ORAL | 1 refills | Status: DC

## 2016-01-04 MED ORDER — LEVOTHYROXINE SODIUM 50 MCG PO TABS: 50.0000 ug | ORAL_TABLET | Freq: Every day | ORAL | 1 refills | Status: DC

## 2016-01-04 MED ORDER — OMEPRAZOLE 20 MG PO CPDR: 20.0000 mg | DELAYED_RELEASE_CAPSULE | Freq: Every day | ORAL | 1 refills | Status: DC

## 2016-01-04 MED ORDER — ATORVASTATIN CALCIUM 10 MG PO TABS: 10.0000 mg | ORAL_TABLET | Freq: Every day | ORAL | 1 refills | Status: DC

## 2016-01-04 NOTE — Progress Notes (Signed)
Name: Karen Davila   MRN: [id number]    DOB: 05-23-1918   Date:01/04/2016       Progress Note  Subjective  Chief Complaint  Chief Complaint  Patient presents with  . Hyperlipidemia    6 month follow up  . Hypertension        Hyperlipidemia  This is a chronic problem. The problem is controlled. Recent lipid tests were reviewed and are normal. Pertinent negatives include no chest pain, leg pain, myalgias or shortness of breath. Current antihyperlipidemic treatment includes statins.  Hypertension  This is a chronic problem. The problem is unchanged. The problem is controlled. Pertinent negatives include no chest pain or shortness of breath. Past treatments include ACE inhibitors and calcium channel blockers. Hypertensive end-organ damage includes a thyroid problem.  Thyroid Problem  Presents for follow-up visit. Symptoms include constipation. Patient reports no depressed mood, diarrhea, dry skin or fatigue. The symptoms have been stable. Her past medical history is significant for hyperlipidemia.  Gastroesophageal Reflux  She reports no chest pain, no dysphagia, no heartburn or no nausea. This is a chronic problem. The symptoms are aggravated by certain foods. Pertinent negatives include no fatigue. She has tried a PPI for the symptoms.     Past Medical History:  Diagnosis Date  . Arthritis   . Chronic kidney disease   . GERD (gastroesophageal reflux disease)   . Hyperlipidemia   . Hypertension     Past Surgical History:  Procedure Laterality Date  . EYE SURGERY Right    Cataract    Family History  Problem Relation Age of Onset  . Cancer Brother     Social History   Social History  . Marital status: Single    Spouse name: N/A  . Number of children: N/A  . Years of education: N/A   Occupational History  . Not on file.   Social History Main Topics  . Smoking status: Never Smoker  . Smokeless tobacco: Never Used  . Alcohol use No  . Drug use: No  . Sexual  activity: No   Other Topics Concern  . Not on file   Social History Narrative  . No narrative on file     Current Outpatient Prescriptions:  .  amLODipine (NORVASC) 5 MG tablet, Take 1 tablet (5 mg total) by mouth daily., Disp: 90 tablet, Rfl: 1  No Known Allergies   Review of Systems  Constitutional: Negative for fatigue.  Respiratory: Negative for shortness of breath.   Cardiovascular: Negative for chest pain.  Gastrointestinal: Positive for constipation. Negative for diarrhea, dysphagia, heartburn and nausea.  Musculoskeletal: Negative for myalgias.     Objective  Vitals:   01/04/16 0853  BP: 124/62  Pulse: 81  Resp: 16  Temp: 97.2 F (36.2 C)  TempSrc: Oral  SpO2: 93%  Weight: 150 lb 4.8 oz (68.2 kg)  Height:  (1.651 m)    Physical Exam  Constitutional: She is well-developed, well-nourished, and in no distress.  HENT:  Head: Normocephalic and atraumatic.  Eyes: Pupils are equal, round, and reactive to light.  Cardiovascular: Normal rate, regular rhythm, S1 normal and S2 normal.   Murmur heard.  Systolic murmur is present with a grade of 2/6  Pulmonary/Chest: Effort normal. No respiratory distress. She has no decreased breath sounds. She has no wheezes. She has no rhonchi.  Musculoskeletal:       Right ankle: She exhibits no swelling.       Left ankle: She exhibits no swelling.  Neurological: She is alert.  Psychiatric: Mood, memory, affect and judgment normal.  Nursing note and vitals reviewed.     Assessment & Plan  1. Essential hypertension  - lisinopril (PRINIVIL,ZESTRIL) 20 MG tablet; Take 1 tablet (20 mg total) by mouth daily.  Dispense: 90 tablet; Refill: 1 - amLODipine (NORVASC) 5 MG tablet; Take 1 tablet (5 mg total) by mouth daily.  Dispense: 90 tablet; Refill: 1  2. Iron deficiency anemia  - CBC w/Diff/Platelet - Iron Binding Cap (TIBC) - Ferritin - Retic  3. Hyperlipidemia  - atorvastatin (LIPITOR) 10 MG tablet; Take 1  tablet (10 mg total) by mouth daily.  Dispense: 90 tablet; Refill: 1 - Lipid Profile - COMPLETE METABOLIC PANEL WITH GFR  4. Other specified hypothyroidism  - levothyroxine (SYNTHROID) 50 MCG tablet; Take 1 tablet (50 mcg total) by mouth daily before breakfast.  Dispense: 90 tablet; Refill: 1 - TSH  5. Gastroesophageal reflux disease, esophagitis presence not specified  - omeprazole (PRILOSEC) 20 MG capsule; Take 1 capsule (20 mg total) by mouth daily.  Dispense: 90 capsule; Refill: 1   Dominica Kent [name] 01/04/2016 9:19 AM

## 2016-01-11 ENCOUNTER — Other Ambulatory Visit: Payer: Self-pay | Admitting: *Deleted

## 2016-01-11 ENCOUNTER — Ambulatory Visit: Payer: Self-pay | Admitting: *Deleted

## 2016-01-11 ENCOUNTER — Encounter: Payer: Self-pay | Admitting: *Deleted

## 2016-01-11 DIAGNOSIS — I10 Essential (primary) hypertension: Secondary | ICD-10-CM

## 2016-01-11 NOTE — Patient Outreach (Addendum)
[facility]) Care Management  01/11/2016  Karen Davila 1917/08/25 [id number]    RN Health Coach telephone call to patient.  Hipaa compliance verified. Per patient she has hypertension and is on the lowest dose of medication. Patient is hard of hearing and wears hearing aids. Her sister lives next door and she likes for her to listen in when she is talking with some one because she hears better. Patient has transportation to Dr visits.  Patient stated that sometimes she has some problems affording her medications. Patient reports taking  medications as ordered. Patient stated that she is on 4 or 5 medications but 6 are listed.  Patient uses a walker, cane and a rollator walker to get around. Per patient she has been having someone calling regarding her affording her medications and wanting to come to her apartment.    Plan: RN Health Coach referred to the pharmacy  Refer to Telephonic case management for hypertension  Gean Maidens BSN RN [facility] Care Management [phone]

## 2016-01-12 ENCOUNTER — Other Ambulatory Visit: Payer: Self-pay

## 2016-01-12 NOTE — Patient Outreach (Signed)
I called Karen Davila to follow up on her need for medication assistance.  She stated that she has all of her medication currently and she has a three month supply of all of them.  She also stated that someone else is contacting her provider in regards to assisting with getting her medications at a lower cost.  I stated that since she has her medications for three months and that someone else is already assisting her, I will not need to assist her.  I told her that I will be available in the future if she has any future pharmacy needs.    Steve Rattler, PharmD, [employer] [facility] [phone]

## 2016-01-18 ENCOUNTER — Telehealth: Payer: Self-pay | Admitting: Emergency Medicine

## 2016-01-18 ENCOUNTER — Other Ambulatory Visit: Payer: Self-pay | Admitting: *Deleted

## 2016-01-18 NOTE — Patient Outreach (Addendum)
[facility]) Care Management  01/18/2016  Karen Davila 05/15/1918 [id number]  Subjective: Telephone call to patient's home number, no answer, no answering machine / voicemail pick up, and unable to leave a message.   Objective: Per chart: Patient has not has any inpatient hospitalizations or ED visit in last 6 months.  Patient has a history of hyperlipidemia, hypertension, and chronic kidney disease.   Patient is hard of hearing and wears hearing aids.  Patient has walker, cane, and rollator for ambulation.    Assessment: [facility] Health Coach on 01/12/16.    Referral reason:  [facility]ph[facility]. No ED       visits or admissions since Jan. Patient has a dx of hypertension.       This has been referred to pharmacy due to sometimes unable to afford       medication. Patient lives alone next door to sister in an apartment.       Patient has hearing aids, walker and cane.  Telephone screening, pending patient contact.  Plan: RNCM will call patient for 2nd telephonic outreach attempt, telephone screen, within 10 business days,  if no return call.    Jamason Peckham H. Gardiner Barefootooper RN, BSN, CCM [facility] Telephonic CM Phone: [phone] Fax: [phone]

## 2016-01-18 NOTE — Telephone Encounter (Signed)
Patient notified of lab results

## 2016-01-19 ENCOUNTER — Encounter: Payer: Self-pay | Admitting: *Deleted

## 2016-01-19 ENCOUNTER — Other Ambulatory Visit: Payer: Self-pay | Admitting: *Deleted

## 2016-01-19 NOTE — Patient Outreach (Addendum)
[facility]) Care Management  01/19/2016  Karen RompMollie J Davila 07/17/1917 [id number]   Subjective: Telephone call to patient's home number, spoke with patient, and HIPAA verified.   States she is doing well and is very hard of hearing.   States he can hear RNCM well and will let RNCM know if she is having difficulty.  Discussed [facility] Care Management services, referral reason, and patient in agreement to complete screen. Patient states she has her blood pressure taken once a month by the fireman that visit her retirement community [facility]([employer]).   States she has been told that her blood pressure is always good for her age.   States she also has her blood pressure taken at the MD's office and it is also good.   States she does not have a blood pressure cuff and does not take her own blood pressure.  States she does not have any care coordination, disease management, disease monitoring, community resource, pharmacy, or transportation needs at this time.   Patient in agreement to receive [facility] information and states she will call RNCM if services needed in the future.  Objective: Per chart review, no ED visits or inpatient admissions within the last 6 months.   Patient's blood pressure at MD's office on 01/04/16 was 124/62.  Assessment: [facility] Health Coach telephone screen completed on 01/11/16. [facility] Care [facility] Pharmacy screening completed on 01/12/16.  [facility] Health Coach referral on 01/12/16.     Referral reason: Referred to Telephonic case manager [facility]. No ED       visits or admissions since Jan. Patient has a dx of hypertension.       This has been referred to pharmacy due to sometimes unable to afford       medication. Patient lives alone next door to sister in an apartment.       Patient has hearing aids, walker and cane.  Telephone screen completed, patient declined services due to no care  management needs at this time, and will contact RNCM if services needed.   No Telephonic RNCM needs at this time.  Plan: RNCM will send patient successful outreach letter, [facility] pamphlet, and magnet.    RNCM will send case closure letter due to declined services/ no care management needs to patient's primary MD. [facility] will send case closure due to declined services / no care management needs request to [facility]Lavelda [facility].     Karen Bolon H. Gardiner Barefootooper RN, BSN, CCM [facility] Care Management [facility] Telephonic CM Phone: [phone] Fax: [phone]

## 2016-03-01 ENCOUNTER — Emergency Department
Admission: EM | Admit: 2016-03-01 | Discharge: 2016-03-01 | Disposition: A | Discharge status: Home / Self Care | Payer: Medicare PPO | Attending: Emergency Medicine | Admitting: Emergency Medicine

## 2016-03-01 ENCOUNTER — Encounter: Payer: Self-pay | Admitting: Emergency Medicine

## 2016-03-01 DIAGNOSIS — Z79899 Other long term (current) drug therapy: Secondary | ICD-10-CM | POA: Insufficient documentation

## 2016-03-01 DIAGNOSIS — R252 Cramp and spasm: Secondary | ICD-10-CM | POA: Diagnosis present

## 2016-03-01 DIAGNOSIS — E039 Hypothyroidism, unspecified: Secondary | ICD-10-CM | POA: Diagnosis not present

## 2016-03-01 DIAGNOSIS — N183 Chronic kidney disease, stage 3 (moderate): Secondary | ICD-10-CM | POA: Diagnosis not present

## 2016-03-01 DIAGNOSIS — I872 Venous insufficiency (chronic) (peripheral): Secondary | ICD-10-CM | POA: Insufficient documentation

## 2016-03-01 DIAGNOSIS — I129 Hypertensive chronic kidney disease with stage 1 through stage 4 chronic kidney disease, or unspecified chronic kidney disease: Secondary | ICD-10-CM | POA: Insufficient documentation

## 2016-03-01 LAB — CBC WITH DIFFERENTIAL/PLATELET
BASOS ABS: 0 10*3/uL (ref 0–0.1)
BASOS PCT: 1 %
EOS PCT: 2 %
Eosinophils Absolute: 0.1 10*3/uL (ref 0–0.7)
HCT: 32.2 % — ABNORMAL LOW (ref 35.0–47.0)
Hemoglobin: 10.4 g/dL — ABNORMAL LOW (ref 12.0–16.0)
Lymphocytes Relative: 28 %
Lymphs Abs: 1.2 10*3/uL (ref 1.0–3.6)
MCH: 26.5 pg (ref 26.0–34.0)
MCHC: 32.4 g/dL (ref 32.0–36.0)
MCV: 81.8 fL (ref 80.0–100.0)
MONO ABS: 0.4 10*3/uL (ref 0.2–0.9)
Monocytes Relative: 10 %
Neutro Abs: 2.6 10*3/uL (ref 1.4–6.5)
Neutrophils Relative %: 59 %
PLATELETS: 200 10*3/uL (ref 150–440)
RBC: 3.94 MIL/uL (ref 3.80–5.20)
RDW: 15.2 % — AB (ref 11.5–14.5)
WBC: 4.4 10*3/uL (ref 3.6–11.0)

## 2016-03-01 LAB — BASIC METABOLIC PANEL
ANION GAP: 8 (ref 5–15)
BUN: 16 mg/dL (ref 6–20)
CHLORIDE: 104 mmol/L (ref 101–111)
CO2: 27 mmol/L (ref 22–32)
Calcium: 9.4 mg/dL (ref 8.9–10.3)
Creatinine, Ser: 1.24 mg/dL — ABNORMAL HIGH (ref 0.44–1.00)
GFR calc Af Amer: 41 mL/min — ABNORMAL LOW (ref >60)
GFR, EST NON AFRICAN AMERICAN: 35 mL/min — AB (ref >60)
GLUCOSE: 80 mg/dL (ref 65–99)
POTASSIUM: 4 mmol/L (ref 3.5–5.1)
SODIUM: 139 mmol/L (ref 135–145)

## 2016-03-01 LAB — MAGNESIUM: Magnesium: 2 mg/dL (ref 1.7–2.4)

## 2016-03-01 NOTE — ED Provider Notes (Signed)
[facility] Emergency Department Provider Note  ____________________________________________  Time seen: Approximately 10:06 AM  I have reviewed the triage vital signs and the nursing notes.   HISTORY  Chief Complaint Leg Pain    HPI Karen Davila is a 82 y.o. female , NAD, presents to the emergency department accompanied by family with several month history of bilateral lower extremity pain and cramping.States the pain usually occurs at night and can be about the thighs or calf. Does have sporadic sharp pains in the thighs and calf throughout the day that improves after she rests and rubs the area. Has not taken any over-the-counter medications for these bouts of pain as they never last more than a few minutes. Denies any falls, injuries or traumas. Has not noted any redness, swelling, abnormal warmth to the skin. Does note that she continues to have itching and dry skin about bilateral lower extremities in which she was seen in this emergency department for earlier this year. States she was unable to get an appointment with vascular specialist to follow-up. She currently denies any chest pain, shortness of breath, wheezing, abdominal pain, nausea, vomiting. No saddle paresthesias or loss of bowel control. No lower back pain. Denies hip pain.   Past Medical History:  Diagnosis Date  . Arthritis   . Chronic kidney disease   . GERD (gastroesophageal reflux disease)   . Hyperlipidemia   . Hypertension     Patient Active Problem List   Diagnosis Date Noted  . Chronic kidney disease, stage 3 07/07/2015  . Cardiac murmur, previously undiagnosed 04/06/2015  . Dementia 04/06/2015  . Difficulty hearing 04/06/2015  . OP (osteoporosis) 04/06/2015  . Hypertension 12/23/2014  . GERD (gastroesophageal reflux disease) 12/23/2014  . Adult hypothyroidism 12/23/2014  . Hyperlipidemia 12/23/2014  . Iron deficiency anemia 12/23/2014    Past Surgical History:  Procedure  Laterality Date  . EYE SURGERY Right    Cataract    Prior to Admission medications   Medication Sig Start Date End Date Taking? Authorizing Provider  amLODipine (NORVASC) 5 MG tablet Take 1 tablet (5 mg total) by mouth daily. 01/04/16   Ellyn HackSyed Asad A Shah, MD  atorvastatin (LIPITOR) 10 MG tablet Take 1 tablet (10 mg total) by mouth daily. 01/04/16   Ellyn HackSyed Asad A Shah, MD  levothyroxine (SYNTHROID) 50 MCG tablet Take 1 tablet (50 mcg total) by mouth daily before breakfast. 01/04/16   Ellyn HackSyed Asad A Shah, MD  lisinopril (PRINIVIL,ZESTRIL) 20 MG tablet Take 1 tablet (20 mg total) by mouth daily. 01/04/16   Ellyn HackSyed Asad A Shah, MD  omeprazole (PRILOSEC) 20 MG capsule Take 1 capsule (20 mg total) by mouth daily. 01/04/16   Ellyn HackSyed Asad A Shah, MD    Allergies Review of patient's allergies indicates no known allergies.  Family History  Problem Relation Age of Onset  . Cancer Brother     Social History Social History  Substance Use Topics  . Smoking status: Never Smoker  . Smokeless tobacco: Never Used  . Alcohol use No     Review of Systems  Constitutional: No fever/chills Eyes: No visual changes.  Cardiovascular: No chest pain. Respiratory: No cough. No shortness of breath. No wheezing.  Gastrointestinal: No abdominal pain.  No nausea, vomiting.   Musculoskeletal: Positive muscle pain and cramping in bilateral thighs and calves. No lower back pain, hip pain. Skin: Negative for rash, redness, swelling, skin sores, open wounds. Neurological: Negative for numbness, weakness, tingling. No saddle paresthesias, loss of bowel  or bladder control. No LOC, dizziness, lightheadedness. 10-point ROS otherwise negative.  ____________________________________________   PHYSICAL EXAM:  VITAL SIGNS: ED Triage Vitals  Enc Vitals Group     BP 03/01/16 0940 (!) 134/58     Pulse Rate 03/01/16 0940 74     Resp 03/01/16 0940 20     Temp 03/01/16 0940 98.5 F (36.9 C)     Temp Source 03/01/16 0940 Oral      SpO2 03/01/16 0940 97 %     Weight 03/01/16 0941 150 lb (68 kg)     Height 03/01/16 0941 5\' 5"  (1.651 m)     Head Circumference --      Peak Flow --      Pain Score 03/01/16 0946 0     Pain Loc --      Pain Edu? --      Excl. in GC? --      Constitutional: Alert and oriented. Well appearing and in no acute distress. Eyes: Conjunctivae are normal Without icterus or injection. Head: Atraumatic. Neck: Supple with full range of motion. No cervical spine tenderness to palpation. Hematological/Lymphatic/Immunilogical: No cervical lymphadenopathy. Cardiovascular: Normal rate, regular rhythm. Normal S1 and S2.  Good peripheral circulation with 1+ pulses noted in the bilateral lower extremities. Respiratory: Normal respiratory effort without tachypnea or retractions. Lungs CTAB with breath sounds noted in all lung fields. No wheeze, rhonchi, rales. Musculoskeletal: No lower extremity tenderness nor edema.  No joint effusions.  Neurologic:  Normal speech and language. No gross focal neurologic deficits are appreciated.  Skin:  Skin about bilateral lower extremities diffusely dry, cool to touch but not abnormally cool. There is no abnormal warmth, redness or swelling to the lower extremities. Skin is warm, dry and intact. No rash noted. Psychiatric: Mood and affect are normal. Speech and behavior are normal. Patient exhibits appropriate insight and judgement.   ____________________________________________   LABS (all labs ordered are listed, but only abnormal results are displayed)  Labs Reviewed  BASIC METABOLIC PANEL  CBC WITH DIFFERENTIAL/PLATELET  MAGNESIUM   ____________________________________________  EKG  None ____________________________________________  RADIOLOGY  None ____________________________________________    PROCEDURES  Procedure(s) performed: None   Procedures   Medications - No data to  display   ____________________________________________   INITIAL IMPRESSION / ASSESSMENT AND PLAN / ED COURSE  Pertinent labs & imaging results that were available during my care of the patient were reviewed by me and considered in my medical decision making (see chart for details).  Clinical Course  Comment By Time  CBC has resulted with a mild anemia which is chronic for the patient due to previous iron deficiency as well as chronic kidney disease. This is being followed by her internist. Hope Pigeon, PA-C 09/19 1203  Basic metabolic panel and magnesium levels are without abnormality. Patient with a normal potassium of 4.0. Normal magnesium of 2.0. Calcium is normal at 9.4. Hope Pigeon, PA-C 09/19 1206  I called [facility] and scheduled the patient appointment to follow up with Dr. Gilda Crease this Thursday at 1 PM. Hope Pigeon, PA-C 09/19 1212    Patient's diagnosis is consistent with Muscle cramps at night and venous insufficiency. Patient will be discharged home with instructions to continue to complete home care in regards to episodes of muscle cramps. No evidence of electrolyte imbalance on evaluation today. I was able to get the patient an appointment on 03/03/2016 to be evaluated by pain and vascular surgery since she was  unable to get an appointment earlier in the year. The patient and her family at the bedside verbalize agreement and understanding of her appointment this Thursday and will arrive at 1232 complete paperwork. Patient is given ED precautions to return to the ED for any worsening or new symptoms.    ____________________________________________  FINAL CLINICAL IMPRESSION(S) / ED DIAGNOSES  Final diagnoses:  Muscle cramps at night  Venous insufficiency of both lower extremities      NEW MEDICATIONS STARTED DURING THIS VISIT:  Discharge Medication List as of 03/01/2016 12:14 PM           Hope Pigeon, PA-C 03/01/16 1445     Sharyn Creamer, MD 03/01/16 1607

## 2016-03-01 NOTE — ED Triage Notes (Signed)
Pt states that she has had leg pain for a long time but a month ago the pain in legs have gotten worse. Feels like it is in the bone.  Pt states that it does not hurt to ambulate and leg pain feels better with rest and rubbing.

## 2016-03-01 NOTE — Discharge Instructions (Signed)
Please go to Dr Marijean HeathSchnier's office this Thursday, 03/03/16, for your appointment at 1pm for follow up.

## 2016-03-14 ENCOUNTER — Encounter (INDEPENDENT_AMBULATORY_CARE_PROVIDER_SITE_OTHER): Payer: Medicare HMO

## 2016-03-14 ENCOUNTER — Ambulatory Visit (INDEPENDENT_AMBULATORY_CARE_PROVIDER_SITE_OTHER): Payer: Medicare HMO | Admitting: Vascular Surgery

## 2016-05-24 ENCOUNTER — Emergency Department
Admission: EM | Admit: 2016-05-24 | Discharge: 2016-05-24 | Disposition: A | Discharge status: Home / Self Care | Payer: Medicare PPO | Attending: Emergency Medicine | Admitting: Emergency Medicine

## 2016-05-24 ENCOUNTER — Encounter: Payer: Self-pay | Admitting: Emergency Medicine

## 2016-05-24 DIAGNOSIS — H1011 Acute atopic conjunctivitis, right eye: Secondary | ICD-10-CM | POA: Insufficient documentation

## 2016-05-24 DIAGNOSIS — E039 Hypothyroidism, unspecified: Secondary | ICD-10-CM | POA: Insufficient documentation

## 2016-05-24 DIAGNOSIS — N183 Chronic kidney disease, stage 3 (moderate): Secondary | ICD-10-CM | POA: Diagnosis not present

## 2016-05-24 DIAGNOSIS — H578 Other specified disorders of eye and adnexa: Secondary | ICD-10-CM | POA: Diagnosis present

## 2016-05-24 DIAGNOSIS — Z79899 Other long term (current) drug therapy: Secondary | ICD-10-CM | POA: Diagnosis not present

## 2016-05-24 DIAGNOSIS — I129 Hypertensive chronic kidney disease with stage 1 through stage 4 chronic kidney disease, or unspecified chronic kidney disease: Secondary | ICD-10-CM | POA: Insufficient documentation

## 2016-05-24 MED ORDER — OLOPATADINE HCL 0.2 % OP SOLN: 1.0000 [drp] | Freq: Every morning | OPHTHALMIC | 0 refills | Status: DC

## 2016-05-24 MED ORDER — FLUORESCEIN SODIUM 0.6 MG OP STRP
1.0000 | ORAL_STRIP | Freq: Once | OPHTHALMIC | Status: AC
  Administered 2016-05-24: 1 via OPHTHALMIC

## 2016-05-24 MED ORDER — FLUORESCEIN SODIUM 0.6 MG OP STRP
1.0000 | ORAL_STRIP | Freq: Once | OPHTHALMIC | Status: DC
  Filled 2016-05-24: qty 1

## 2016-05-24 MED ORDER — TETRACAINE HCL 0.5 % OP SOLN
2.0000 [drp] | Freq: Once | OPHTHALMIC | Status: AC
  Administered 2016-05-24: 2 [drp] via OPHTHALMIC

## 2016-05-24 MED ORDER — TETRACAINE HCL 0.5 % OP SOLN
2.0000 [drp] | Freq: Once | OPHTHALMIC | Status: DC
  Filled 2016-05-24: qty 2

## 2016-05-24 NOTE — ED Notes (Signed)
Tonopen to provider at bedside

## 2016-05-24 NOTE — ED Provider Notes (Signed)
[facility] Emergency Department Provider Note ____________________________________________  Time seen: Approximately 10:10 AM  I have reviewed the triage vital signs and the nursing notes.   HISTORY  Chief Complaint Eye Pain   HPI Madelynn DoneMollie J Nissley is a 82 y.o. female who presents to the emergency department for evaluation of right eye irritation and stinging. She denies change in vision, but states that she has cataracts and her vision is not very good anyway. She reports that these current symptoms have came and gone multiple times, but returned about 4 days ago. She describes the sensation as irritated, itching, and stinging. She is scheduled an appointment with her ophthalmologist, but couldn't be seen until January. She denies known injury. She denies a history of glaucoma and denies being diabetic.  Past Medical History:  Diagnosis Date  . Arthritis   . Chronic kidney disease   . GERD (gastroesophageal reflux disease)   . Hyperlipidemia   . Hypertension     Patient Active Problem List   Diagnosis Date Noted  . Chronic kidney disease, stage 3 07/07/2015  . Cardiac murmur, previously undiagnosed 04/06/2015  . Dementia 04/06/2015  . Difficulty hearing 04/06/2015  . OP (osteoporosis) 04/06/2015  . Hypertension 12/23/2014  . GERD (gastroesophageal reflux disease) 12/23/2014  . Adult hypothyroidism 12/23/2014  . Hyperlipidemia 12/23/2014  . Iron deficiency anemia 12/23/2014    Past Surgical History:  Procedure Laterality Date  . EYE SURGERY Right    Cataract    Prior to Admission medications   Medication Sig Start Date End Date Taking? Authorizing Provider  amLODipine (NORVASC) 5 MG tablet Take 1 tablet (5 mg total) by mouth daily. 01/04/16   Ellyn HackSyed Asad A Shah, MD  atorvastatin (LIPITOR) 10 MG tablet Take 1 tablet (10 mg total) by mouth daily. 01/04/16   Ellyn HackSyed Asad A Shah, MD  levothyroxine (SYNTHROID) 50 MCG tablet Take 1 tablet (50 mcg total) by  mouth daily before breakfast. 01/04/16   Ellyn HackSyed Asad A Shah, MD  lisinopril (PRINIVIL,ZESTRIL) 20 MG tablet Take 1 tablet (20 mg total) by mouth daily. 01/04/16   Ellyn HackSyed Asad A Shah, MD  Olopatadine HCl 0.2 % SOLN Apply 1 drop to eye every morning. 05/24/16   Chinita Pesterari B Amanuel Sinkfield, FNP  omeprazole (PRILOSEC) 20 MG capsule Take 1 capsule (20 mg total) by mouth daily. 01/04/16   Ellyn HackSyed Asad A Shah, MD    Allergies Patient has no known allergies.  Family History  Problem Relation Age of Onset  . Cancer Brother     Social History Social History  Substance Use Topics  . Smoking status: Never Smoker  . Smokeless tobacco: Never Used  . Alcohol use No    Review of Systems   Constitutional: No fever/chills Eyes: Negative for visual changes. Positive for stinging pain. Musculoskeletal: Negative for pain. Skin: Negative for rash. Neurological: Negative for headaches, focal weakness or numbness. Allergic: Negative for seasonal allergies. ____________________________________________  PHYSICAL EXAM:  VITAL SIGNS: ED Triage Vitals  Enc Vitals Group     BP 05/24/16 0933 (!) 148/53     Pulse Rate 05/24/16 0933 73     Resp 05/24/16 0933 20     Temp 05/24/16 0933 97.5 F (36.4 C)     Temp Source 05/24/16 0933 Oral     SpO2 05/24/16 0933 99 %     Weight 05/24/16 0934 150 lb (68 kg)     Height --      Head Circumference --      Peak  Flow --      Pain Score --      Pain Loc --      Pain Edu? --      Excl. in GC? --     Constitutional: Alert and oriented. Well appearing and in no acute distress. Eyes: Visual acuity--see nursing documentation; No globe trauma; Eyelids normal to inspection; Sclera appears anicteric.  Eyelids were inverted. Conjunctiva appears erythematous; Cornea cataract is present, fluorescein stain exam performed without identification of ulceration or abrasion. Pressures via Tono-Pen 11, 17, 18, 17. Clear drainage noted in the lower lid Head: Atraumatic. Nose: No  congestion/rhinnorhea. Mouth/Throat: Mucous membranes are moist. Respiratory: Respirations even and unlabored Musculoskeletal:Normal ROM x 4 extremities. Neurologic:  Normal speech and language. No gross focal neurologic deficits are appreciated. Speech is normal. No gait instability. Skin:  Skin is warm, dry and intact. No rash noted. Psychiatric: Mood and affect are normal. Speech and behavior are normal.  ____________________________________________   LABS (all labs ordered are listed, but only abnormal results are displayed)  Labs Reviewed - No data to display ____________________________________________  EKG   ____________________________________________  RADIOLOGY  Not indicated ____________________________________________   PROCEDURES  Procedure(s) performed: Fluorescein stain exam ____________________________________________   INITIAL IMPRESSION / ASSESSMENT AND PLAN / ED COURSE  Pertinent labs & imaging results that were available during my care of the patient were reviewed by me and considered in my medical decision making (see chart for details).  Clinical Course    82 year old female presenting to the emergency department for evaluation of right eye irritation and stinging. Her exam today is reassuring and appears to be consistent with allergic conjunctivitis. She was given strict return precautions and advised to call her ophthalmologist for symptoms that are not improving over the next few days. She was given a prescription for Pataday and advised to use daily as prescribed.  ____________________________________________   FINAL CLINICAL IMPRESSION(S) / ED DIAGNOSES  Final diagnoses:  Allergic conjunctivitis of right eye    Note:  This document was prepared using Dragon voice recognition software and may include unintentional dictation errors.    Chinita PesterCari B Tomasina Keasling, FNP 05/24/16 1245    Arnaldo NatalPaul F Malinda, MD 05/24/16 [phone]

## 2016-05-24 NOTE — ED Notes (Signed)
C/o right eye pain and stinging for 4 days.  Denies vision changes, denies blurred vision.  Denies injury

## 2016-05-24 NOTE — ED Triage Notes (Signed)
Pt to ed with c/o right eye pain and "stinging" x 4 days.  No redness noted, no drainage noted.

## 2016-07-06 ENCOUNTER — Ambulatory Visit (INDEPENDENT_AMBULATORY_CARE_PROVIDER_SITE_OTHER): Payer: Medicare PPO | Admitting: Family Medicine

## 2016-07-06 VITALS — BP 120/70 | HR 84 | Temp 98.0°F | Resp 16 | Ht 65.0 in | Wt 151.8 lb

## 2016-07-06 DIAGNOSIS — I10 Essential (primary) hypertension: Secondary | ICD-10-CM | POA: Diagnosis not present

## 2016-07-06 DIAGNOSIS — E785 Hyperlipidemia, unspecified: Secondary | ICD-10-CM

## 2016-07-06 DIAGNOSIS — D509 Iron deficiency anemia, unspecified: Secondary | ICD-10-CM | POA: Diagnosis not present

## 2016-07-06 DIAGNOSIS — K219 Gastro-esophageal reflux disease without esophagitis: Secondary | ICD-10-CM | POA: Diagnosis not present

## 2016-07-06 DIAGNOSIS — E039 Hypothyroidism, unspecified: Secondary | ICD-10-CM | POA: Diagnosis not present

## 2016-07-06 DIAGNOSIS — R252 Cramp and spasm: Secondary | ICD-10-CM

## 2016-07-06 LAB — HEPATIC FUNCTION PANEL
ALBUMIN: 3.8 g/dL (ref 3.6–5.1)
ALT: 11 U/L (ref 6–29)
AST: 20 U/L (ref 10–35)
Alkaline Phosphatase: 54 U/L (ref 33–130)
Bilirubin, Direct: 0.1 mg/dL (ref <=0.2)
Indirect Bilirubin: 0.3 mg/dL (ref 0.2–1.2)
TOTAL PROTEIN: 6.8 g/dL (ref 6.1–8.1)
Total Bilirubin: 0.4 mg/dL (ref 0.2–1.2)

## 2016-07-06 LAB — BASIC METABOLIC PANEL WITH GFR
BUN: 18 mg/dL (ref 7–25)
CHLORIDE: 107 mmol/L (ref 98–110)
CO2: 29 mmol/L (ref 20–31)
CREATININE: 1.31 mg/dL — AB (ref 0.60–0.88)
Calcium: 9.8 mg/dL (ref 8.6–10.4)
GFR, Est African American: 40 mL/min — ABNORMAL LOW (ref >=60)
GFR, Est Non African American: 34 mL/min — ABNORMAL LOW (ref >=60)
GLUCOSE: 90 mg/dL (ref 65–99)
Potassium: 4.7 mmol/L (ref 3.5–5.3)
Sodium: 143 mmol/L (ref 135–146)

## 2016-07-06 LAB — LIPID PANEL
CHOL/HDL RATIO: 2.8 ratio (ref <5.0)
Cholesterol: 167 mg/dL (ref <200)
HDL: 59 mg/dL (ref >50)
LDL CALC: 79 mg/dL (ref <100)
Triglycerides: 144 mg/dL (ref <150)
VLDL: 29 mg/dL (ref <30)

## 2016-07-06 LAB — CBC WITH DIFFERENTIAL/PLATELET
BASOS ABS: 0 {cells}/uL (ref 0–200)
Basophils Relative: 0 %
Eosinophils Absolute: 102 cells/uL (ref 15–500)
Eosinophils Relative: 2 %
HEMATOCRIT: 32.1 % — AB (ref 35.0–45.0)
Hemoglobin: 9.6 g/dL — ABNORMAL LOW (ref 11.7–15.5)
LYMPHS PCT: 29 %
Lymphs Abs: 1479 cells/uL (ref 850–3900)
MCH: 25.3 pg — AB (ref 27.0–33.0)
MCHC: 29.9 g/dL — AB (ref 32.0–36.0)
MCV: 84.7 fL (ref 80.0–100.0)
MPV: 10.2 fL (ref 7.5–12.5)
Monocytes Absolute: 357 cells/uL (ref 200–950)
Monocytes Relative: 7 %
NEUTROS PCT: 62 %
Neutro Abs: 3162 cells/uL (ref 1500–7800)
Platelets: 243 10*3/uL (ref 140–400)
RBC: 3.79 MIL/uL — AB (ref 3.80–5.10)
RDW: 15.3 % — ABNORMAL HIGH (ref 11.0–15.0)
WBC: 5.1 10*3/uL (ref 3.8–10.8)

## 2016-07-06 LAB — TSH: TSH: 2.27 m[IU]/L

## 2016-07-06 LAB — MAGNESIUM: MAGNESIUM: 1.8 mg/dL (ref 1.5–2.5)

## 2016-07-06 LAB — FERRITIN: FERRITIN: 205 ng/mL (ref 20–288)

## 2016-07-06 MED ORDER — AMLODIPINE BESYLATE 5 MG PO TABS: 5.0000 mg | ORAL_TABLET | Freq: Every day | ORAL | 1 refills | Status: DC

## 2016-07-06 MED ORDER — LISINOPRIL 20 MG PO TABS: 20.0000 mg | ORAL_TABLET | Freq: Every day | ORAL | 1 refills | Status: DC

## 2016-07-06 NOTE — Progress Notes (Signed)
Name: Karen Davila   MRN: [id number]    DOB: 05/11/1918   Date:07/06/2016       Progress Note  Subjective  Chief Complaint  Chief Complaint  Patient presents with  . Follow-up    6 month  . Leg Pain   Bilateral Leg Cramps and Itching: Pt. Presents for evaluation of bilateral leg cramps and itching, turning dark color in places. This has been going on for at least a year. She reportedly has been seen by local Vein specialist but was told that she did not need any procedures or intervention. She has been to the ER with similar complaints as well.    Hypertension  This is a chronic problem. The problem is unchanged. The problem is controlled. Pertinent negatives include no blurred vision, chest pain, headaches, orthopnea, palpitations or shortness of breath. Past treatments include beta blockers and ACE inhibitors. There is no history of kidney disease, CAD/MI or CVA.  Hyperlipidemia  This is a chronic problem. The problem is controlled. Recent lipid tests were reviewed and are normal. Pertinent negatives include no chest pain, leg pain, myalgias or shortness of breath. Current antihyperlipidemic treatment includes statins.  Gastroesophageal Reflux  She reports no abdominal pain, no chest pain, no coughing, no heartburn or no sore throat. This is a chronic problem. The problem has been unchanged. She has tried a PPI for the symptoms.    Past Medical History:  Diagnosis Date  . Arthritis   . Chronic kidney disease   . GERD (gastroesophageal reflux disease)   . Hyperlipidemia   . Hypertension     Past Surgical History:  Procedure Laterality Date  . EYE SURGERY Right    Cataract    Family History  Problem Relation Age of Onset  . Cancer Brother     Social History   Social History  . Marital status: Single    Spouse name: N/A  . Number of children: N/A  . Years of education: N/A   Occupational History  . Not on file.   Social History Main Topics  . Smoking status:  Never Smoker  . Smokeless tobacco: Never Used  . Alcohol use No  . Drug use: No  . Sexual activity: No   Other Topics Concern  . Not on file   Social History Narrative  . No narrative on file     Current Outpatient Prescriptions:  .  amLODipine (NORVASC) 5 MG tablet, Take 1 tablet (5 mg total) by mouth daily., Disp: 90 tablet, Rfl: 1 .  atorvastatin (LIPITOR) 10 MG tablet, Take 1 tablet (10 mg total) by mouth daily., Disp: 90 tablet, Rfl: 1 .  levothyroxine (SYNTHROID) 50 MCG tablet, Take 1 tablet (50 mcg total) by mouth daily before breakfast., Disp: 90 tablet, Rfl: 1 .  lisinopril (PRINIVIL,ZESTRIL) 20 MG tablet, Take 1 tablet (20 mg total) by mouth daily., Disp: 90 tablet, Rfl: 1 .  Olopatadine HCl 0.2 % SOLN, Apply 1 drop to eye every morning., Disp: 1 Bottle, Rfl: 0 .  omeprazole (PRILOSEC) 20 MG capsule, Take 1 capsule (20 mg total) by mouth daily., Disp: 90 capsule, Rfl: 1  No Known Allergies   Review of Systems  HENT: Negative for sore throat.   Eyes: Negative for blurred vision.  Respiratory: Negative for cough and shortness of breath.   Cardiovascular: Negative for chest pain, palpitations and orthopnea.  Gastrointestinal: Negative for abdominal pain and heartburn.  Musculoskeletal: Negative for myalgias.  Neurological: Negative for headaches.  Objective  Vitals:   07/06/16 0838  BP: 120/70  Pulse: 84  Resp: 16  Temp: 98 F (36.7 C)  TempSrc: Oral  SpO2: 97%  Weight: 151 lb 12.8 oz (68.9 kg)  Height: 5\' 5"  (1.651 m)    Physical Exam  Constitutional: She is well-developed, well-nourished, and in no distress.  Cardiovascular: Normal rate, regular rhythm, S1 normal and S2 normal.   Murmur heard.  Systolic murmur is present with a grade of 2/6  Pulmonary/Chest: Effort normal. No respiratory distress. She has no decreased breath sounds. She has no wheezes.  Abdominal: Soft. Bowel sounds are normal. There is no tenderness.  Musculoskeletal:       Left  lower leg: She exhibits no tenderness, no swelling and no edema.  Right and left lower extremities normal by appearance except for some hyperpigmentation over the anterior shins, no appreciable swelling, normal ROM,  Psychiatric: Mood, memory, affect and judgment normal.  Nursing note and vitals reviewed.      Assessment & Plan  1. Bilateral leg cramps Obtain lab work to evaluate for possible etiologies including anemia and electrolyte disturbance. Suspect deconditioning also playing a role. - Ferritin - Hepatic function panel - Magnesium  2. Gastroesophageal reflux disease, esophagitis presence not specified  - CBC with Differential  3. Essential hypertension  - BASIC METABOLIC PANEL WITH GFR - lisinopril (PRINIVIL,ZESTRIL) 20 MG tablet; Take 1 tablet (20 mg total) by mouth daily.  Dispense: 90 tablet; Refill: 1 - amLODipine (NORVASC) 5 MG tablet; Take 1 tablet (5 mg total) by mouth daily.  Dispense: 90 tablet; Refill: 1  4. Hyperlipidemia, unspecified hyperlipidemia type  - Hepatic function panel  5. Adult hypothyroidism  - TSH   Devone Bonilla [name] 07/06/2016 8:45 AM

## 2016-07-11 DIAGNOSIS — H25812 Combined forms of age-related cataract, left eye: Secondary | ICD-10-CM | POA: Diagnosis not present

## 2016-07-11 DIAGNOSIS — H35342 Macular cyst, hole, or pseudohole, left eye: Secondary | ICD-10-CM | POA: Diagnosis not present

## 2016-07-11 DIAGNOSIS — Z9841 Cataract extraction status, right eye: Secondary | ICD-10-CM | POA: Diagnosis not present

## 2016-07-11 DIAGNOSIS — I10 Essential (primary) hypertension: Secondary | ICD-10-CM | POA: Diagnosis not present

## 2016-07-11 DIAGNOSIS — H5203 Hypermetropia, bilateral: Secondary | ICD-10-CM | POA: Diagnosis not present

## 2016-07-11 DIAGNOSIS — Z961 Presence of intraocular lens: Secondary | ICD-10-CM | POA: Diagnosis not present

## 2016-07-11 DIAGNOSIS — H52223 Regular astigmatism, bilateral: Secondary | ICD-10-CM | POA: Diagnosis not present

## 2016-07-13 DIAGNOSIS — H2589 Other age-related cataract: Secondary | ICD-10-CM | POA: Diagnosis not present

## 2016-08-08 ENCOUNTER — Other Ambulatory Visit: Payer: Self-pay | Admitting: Family Medicine

## 2016-08-08 DIAGNOSIS — K219 Gastro-esophageal reflux disease without esophagitis: Secondary | ICD-10-CM

## 2016-08-22 ENCOUNTER — Ambulatory Visit (INDEPENDENT_AMBULATORY_CARE_PROVIDER_SITE_OTHER): Payer: Medicare HMO

## 2016-08-22 VITALS — BP 172/62 | HR 72 | Temp 97.9°F | Ht 63.0 in | Wt 151.4 lb

## 2016-08-22 DIAGNOSIS — Z0000 Encounter for general adult medical examination without abnormal findings: Secondary | ICD-10-CM | POA: Diagnosis not present

## 2016-08-22 NOTE — Progress Notes (Signed)
Subjective:   Karen Davila is a 83 y.o. female who presents for an Initial Medicare Annual Wellness Visit.  Review of Systems    N/A  Cardiac Risk Factors include: advanced age (>71men, >78 women);dyslipidemia;hypertension     Objective:    Today's Vitals   08/22/16 1057 08/22/16 1104  BP: (!) 172/62   Pulse: 72   Temp: 97.9 F (36.6 C)   TempSrc: Oral   Weight: 151 lb 6.4 oz (68.7 kg)   Height: 5\' 3"  (1.6 m)   PainSc: 0-No pain 0-No pain   Body mass index is 26.82 kg/m.   Current Medications (verified) Outpatient Encounter Prescriptions as of 08/22/2016  Medication Sig  . amLODipine (NORVASC) 5 MG tablet Take 1 tablet (5 mg total) by mouth daily.  [name] aspirin EC 81 MG tablet Take 81 mg by mouth daily.  [name] atorvastatin (LIPITOR) 10 MG tablet Take 1 tablet (10 mg total) by mouth daily.  [name] levothyroxine (SYNTHROID) 50 MCG tablet Take 1 tablet (50 mcg total) by mouth daily before breakfast.  . lisinopril (PRINIVIL,ZESTRIL) 20 MG tablet Take 1 tablet (20 mg total) by mouth daily.  [name] omeprazole (PRILOSEC) 20 MG capsule TAKE ONE CAPSULE BY MOUTH ONCE DAILY  . Olopatadine HCl 0.2 % SOLN Apply 1 drop to eye every morning. (Patient not taking: Reported on 08/22/2016)   No facility-administered encounter medications on file as of 08/22/2016.     Allergies (verified) Patient has no known allergies.   History: Past Medical History:  Diagnosis Date  . Arthritis   . Chronic kidney disease   . GERD (gastroesophageal reflux disease)   . Hyperlipidemia   . Hypertension    Past Surgical History:  Procedure Laterality Date  . EYE SURGERY Right    Cataract   Family History  Problem Relation Age of Onset  . Cancer Brother    Social History   Occupational History  . Not on file.   Social History Main Topics  . Smoking status: Never Smoker  . Smokeless tobacco: Never Used  . Alcohol use No  . Drug use: No  . Sexual activity: No    Tobacco Counseling Counseling  given: Not Answered   Activities of Daily Living In your present state of health, do you have any difficulty performing the following activities: 08/22/2016  Hearing? Y  Vision? Y  Difficulty concentrating or making decisions? Y  Walking or climbing stairs? Y  Dressing or bathing? N  Doing errands, shopping? Y  Preparing Food and eating ? N  Using the Toilet? N  In the past six months, have you accidently leaked urine? N  Do you have problems with loss of bowel control? N  Managing your Medications? N  Managing your Finances? Y  Housekeeping or managing your Housekeeping? N  Some recent data might be hidden    Immunizations and Health Maintenance  There is no immunization history on file for this patient. There are no preventive care reminders to display for this patient.  Patient Care Team: Ellyn Hack, MD as PCP - General (Family Medicine)  Indicate any recent Medical Services you may have received from other than [facility] providers in the past year (date may be approximate).     Assessment:   This is a routine wellness examination for [name].   Hearing/Vision screen Vision Screening Comments: Pt sees Dr Larence Penning for vision checks yearly.   Dietary issues and exercise activities discussed: Current Exercise Habits: Structured exercise class, Type of  exercise: stretching;strength training/weights, Time (Minutes): 60, Frequency (Times/Week): 2, Weekly Exercise (Minutes/Week): 120, Intensity: Mild, Exercise limited by: None identified  Goals    . Increase water intake          Recommend to continue drinking 6 glasses of water a day.      Depression Screen PHQ 2/9 Scores 08/22/2016 07/06/2016 01/11/2016 07/07/2015 04/06/2015 02/03/2015 12/23/2014  PHQ - 2 Score 0 0 0 0 0 0 0    Fall Risk Fall Risk  08/22/2016 07/06/2016 07/07/2015 04/06/2015 02/03/2015  Falls in the past year? No No No No No  Risk for fall due to : - Impaired balance/gait;Impaired vision - - -    Cognitive  Function:     6CIT Screen 08/22/2016  What Year? 0 points  What month? 0 points  What time? 0 points  Count back from 20 0 points  Months in reverse 0 points  Repeat phrase 8 points  Total Score 8    Screening Tests Health Maintenance  Topic Date Due  . INFLUENZA VACCINE  02/11/2017 (Originally 01/12/2016)  . DEXA SCAN  06/13/2026 (Originally 10/11/1984)  . TETANUS/TDAP  06/13/2026 (Originally 10/12/1936)  . PNA vac Low Risk Adult (1 of 2 - PCV13) 06/13/2026 (Originally 10/11/1984)      Plan:  I have personally reviewed and addressed the Medicare Annual Wellness questionnaire and have noted the following in the patient's chart:  A. Medical and social history B. Use of alcohol, tobacco or illicit drugs  C. Current medications and supplements D. Functional ability and status E.  Nutritional status F.  Physical activity G. Advance directives H. List of other physicians I.  Hospitalizations, surgeries, and ER visits in previous 12 months J.  Vitals K. Screenings such as hearing and vision if needed, cognitive and depression L. Referrals and appointments - none  In addition, I have reviewed and discussed with patient certain preventive protocols, quality metrics, and best practice recommendations. A written personalized care plan for preventive services as well as general preventive health recommendations were provided to patient.  See attached scanned questionnaire for additional information.   Signed,  Hyacinth MeekerMckenzie Marieann Zipp, LPN Nurse Health Advisor I, as supervising physician, have reviewed the nurse health advisor's Medicare Wellness Visit note for this patient and concur with the findings and recommendations listed above.  Signed Syed Asad A. Sherryll BurgerShah MD Attending Physician.  MD Recommendations: None. Pt declined DEXA scan, tetatnus, influenza and pneumonia vaccines.

## 2016-08-22 NOTE — Patient Instructions (Signed)

## 2016-08-24 ENCOUNTER — Emergency Department
Admission: EM | Admit: 2016-08-24 | Discharge: 2016-08-25 | Disposition: A | Discharge status: Home / Self Care | Payer: Medicare HMO | Attending: Emergency Medicine | Admitting: Emergency Medicine

## 2016-08-24 DIAGNOSIS — I129 Hypertensive chronic kidney disease with stage 1 through stage 4 chronic kidney disease, or unspecified chronic kidney disease: Secondary | ICD-10-CM | POA: Diagnosis not present

## 2016-08-24 DIAGNOSIS — Z7982 Long term (current) use of aspirin: Secondary | ICD-10-CM | POA: Insufficient documentation

## 2016-08-24 DIAGNOSIS — N183 Chronic kidney disease, stage 3 (moderate): Secondary | ICD-10-CM | POA: Insufficient documentation

## 2016-08-24 DIAGNOSIS — I10 Essential (primary) hypertension: Secondary | ICD-10-CM

## 2016-08-24 DIAGNOSIS — Z79899 Other long term (current) drug therapy: Secondary | ICD-10-CM | POA: Insufficient documentation

## 2016-08-24 LAB — CBC
HEMATOCRIT: 30.7 % — AB (ref 35.0–47.0)
Hemoglobin: 9.8 g/dL — ABNORMAL LOW (ref 12.0–16.0)
MCH: 26.3 pg (ref 26.0–34.0)
MCHC: 32 g/dL (ref 32.0–36.0)
MCV: 82.1 fL (ref 80.0–100.0)
Platelets: 202 10*3/uL (ref 150–440)
RBC: 3.74 MIL/uL — ABNORMAL LOW (ref 3.80–5.20)
RDW: 15 % — AB (ref 11.5–14.5)
WBC: 4.8 10*3/uL (ref 3.6–11.0)

## 2016-08-24 LAB — COMPREHENSIVE METABOLIC PANEL
ALK PHOS: 52 U/L (ref 38–126)
ALT: 13 U/L — ABNORMAL LOW (ref 14–54)
ANION GAP: 7 (ref 5–15)
AST: 28 U/L (ref 15–41)
Albumin: 4.1 g/dL (ref 3.5–5.0)
BUN: 12 mg/dL (ref 6–20)
CALCIUM: 9.2 mg/dL (ref 8.9–10.3)
CHLORIDE: 106 mmol/L (ref 101–111)
CO2: 25 mmol/L (ref 22–32)
Creatinine, Ser: 1.08 mg/dL — ABNORMAL HIGH (ref 0.44–1.00)
GFR calc non Af Amer: 42 mL/min — ABNORMAL LOW (ref >60)
GFR, EST AFRICAN AMERICAN: 49 mL/min — AB (ref >60)
GLUCOSE: 137 mg/dL — AB (ref 65–99)
Potassium: 3.6 mmol/L (ref 3.5–5.1)
SODIUM: 138 mmol/L (ref 135–145)
Total Bilirubin: 0.3 mg/dL (ref 0.3–1.2)
Total Protein: 7.4 g/dL (ref 6.5–8.1)

## 2016-08-24 LAB — TROPONIN I

## 2016-08-24 MED ORDER — AMLODIPINE BESYLATE 5 MG PO TABS: 5.0000 mg | ORAL_TABLET | Freq: Once | ORAL | Status: DC

## 2016-08-24 NOTE — ED Triage Notes (Signed)
Pt states is here for hypertension. Pt denies other symptoms. Pt states blood pressure at home was 200/?. Pt appears in no acute distress. Pt alert and oriented x3.

## 2016-08-24 NOTE — ED Provider Notes (Signed)
[facility] Emergency Department Provider Note    First MD Initiated Contact with Patient 08/24/16 2329     (approximate)  I have reviewed the triage vital signs and the nursing notes.   HISTORY  Chief Complaint Hypertension   HPI Karen Davila is a 83 y.o. female with below list of chronic medical conditions including hypertension presents to the emergency department with hypertension noted at home patient's blood pressure 200/66 on arrival to the emergency department. Patient denies any symptoms. Patient denies any chest pain or shortness of breath no nausea or vomiting. Patient denies any headache dizziness weakness numbness or gait instability. Patient denies any visual changes.   Past Medical History:  Diagnosis Date  . Arthritis   . Chronic kidney disease   . GERD (gastroesophageal reflux disease)   . Hyperlipidemia   . Hypertension     Patient Active Problem List   Diagnosis Date Noted  . Bilateral leg cramps 07/06/2016  . Chronic kidney disease, stage 3 07/07/2015  . Cardiac murmur, previously undiagnosed 04/06/2015  . Dementia 04/06/2015  . Difficulty hearing 04/06/2015  . OP (osteoporosis) 04/06/2015  . Hypertension 12/23/2014  . GERD (gastroesophageal reflux disease) 12/23/2014  . Adult hypothyroidism 12/23/2014  . Hyperlipidemia 12/23/2014  . Iron deficiency anemia 12/23/2014    Past Surgical History:  Procedure Laterality Date  . EYE SURGERY Right    Cataract    Prior to Admission medications   Medication Sig Start Date End Date Taking? Authorizing Provider  amLODipine (NORVASC) 5 MG tablet Take 1 tablet (5 mg total) by mouth daily. 07/06/16   Ellyn HackSyed Asad A Shah, MD  aspirin EC 81 MG tablet Take 81 mg by mouth daily.    Historical Provider, MD  atorvastatin (LIPITOR) 10 MG tablet Take 1 tablet (10 mg total) by mouth daily. 01/04/16   Ellyn HackSyed Asad A Shah, MD  levothyroxine (SYNTHROID) 50 MCG tablet Take 1 tablet (50 mcg total) by  mouth daily before breakfast. 01/04/16   Ellyn HackSyed Asad A Shah, MD  lisinopril (PRINIVIL,ZESTRIL) 20 MG tablet Take 1 tablet (20 mg total) by mouth daily. 07/06/16   Ellyn HackSyed Asad A Shah, MD  Olopatadine HCl 0.2 % SOLN Apply 1 drop to eye every morning. Patient not taking: Reported on 08/22/2016 05/24/16   Chinita Pesterari B Triplett, FNP  omeprazole (PRILOSEC) 20 MG capsule TAKE ONE CAPSULE BY MOUTH ONCE DAILY 08/09/16   Ellyn HackSyed Asad A Shah, MD    Allergies No known drug allergies  Family History  Problem Relation Age of Onset  . Cancer Brother     Social History Social History  Substance Use Topics  . Smoking status: Never Smoker  . Smokeless tobacco: Never Used  . Alcohol use No    Review of Systems Constitutional: No fever/chills Eyes: No visual changes. ENT: No sore throat. Cardiovascular: Denies chest pain. Respiratory: Denies shortness of breath. Gastrointestinal: No abdominal pain.  No nausea, no vomiting.  No diarrhea.  No constipation. Genitourinary: Negative for dysuria. Musculoskeletal: Negative for back pain. Skin: Negative for rash. Neurological: Negative for headaches, focal weakness or numbness.  10-point ROS otherwise negative.  ____________________________________________   PHYSICAL EXAM:  VITAL SIGNS: ED Triage Vitals  Enc Vitals Group     BP 08/24/16 2031 (!) 200/66     Pulse Rate 08/24/16 2030 69     Resp 08/24/16 2030 14     Temp 08/24/16 2030 98.3 F (36.8 C)     Temp Source 08/24/16 2030 Oral  SpO2 08/24/16 2030 100 %     Weight 08/24/16 2030 153 lb (69.4 kg)     Height 08/24/16 2030 5\' 3"  (1.6 m)     Head Circumference --      Peak Flow --      Pain Score 08/24/16 2031 0     Pain Loc --      Pain Edu? --      Excl. in GC? --     Constitutional: Alert and oriented. Well appearing and in no acute distress. Eyes: Conjunctivae are normal. PERRL. EOMI. Head: Atraumatic. Nose: No congestion/rhinnorhea. Mouth/Throat: Mucous membranes are moist. Oropharynx  non-erythematous. Neck: No stridor.  No meningeal signs. No cervical spine tenderness to palpation. Cardiovascular: Normal rate, regular rhythm. Good peripheral circulation. Grossly normal heart sounds. Respiratory: Normal respiratory effort.  No retractions. Lungs CTAB. Gastrointestinal: Soft and nontender. No distention.  Musculoskeletal: No lower extremity tenderness nor edema. No gross deformities of extremities. Neurologic:  Normal speech and language. No gross focal neurologic deficits are appreciated.  Skin:  Skin is warm, dry and intact. No rash noted. Psychiatric: Mood and affect are normal. Speech and behavior are normal.  ____________________________________________   LABS (all labs ordered are listed, but only abnormal results are displayed)  Labs Reviewed  CBC - Abnormal; Notable for the following:       Result Value   RBC 3.74 (*)    Hemoglobin 9.8 (*)    HCT 30.7 (*)    RDW 15.0 (*)    All other components within normal limits  COMPREHENSIVE METABOLIC PANEL - Abnormal; Notable for the following:    Glucose, Bld 137 (*)    Creatinine, Ser 1.08 (*)    ALT 13 (*)    GFR calc non Af Amer 42 (*)    GFR calc Af Amer 49 (*)    All other components within normal limits  TROPONIN I   ____________________________________________  EKG  ED ECG REPORT I, Wolfe N Claudell Wohler, the attending physician, personally viewed and interpreted this ECG.   Date: 08/26/2016  EKG Time: 8:46 PM  Rate: 69  Rhythm: Normal sinus rhythm  Axis: Normal  Intervals: Normal  ST&T Change: None   Procedures      INITIAL IMPRESSION / ASSESSMENT AND PLAN / ED COURSE  Pertinent labs & imaging results that were available during my care of the patient were reviewed by me and considered in my medical decision making (see chart for details).  83 year old female presenting with asymptomatic hypertension. Patient given clonidine 0.1 mg in the emergency department with gradual reduction in  blood pressure current BP 176/68. Patient is advised to follow-up with primary care provider today for continued blood pressure management. Patient remained asymptomatic during the entire ED stay.      ____________________________________________  FINAL CLINICAL IMPRESSION(S) / ED DIAGNOSES  Final diagnoses:  Essential hypertension     MEDICATIONS GIVEN DURING THIS VISIT:  Medications  amLODipine (NORVASC) tablet 5 mg (not administered)     NEW OUTPATIENT MEDICATIONS STARTED DURING THIS VISIT:  New Prescriptions   No medications on file    Modified Medications   No medications on file    Discontinued Medications   No medications on file     Note:  This document was prepared using Dragon voice recognition software and may include unintentional dictation errors.    Darci Current, MD 08/26/16 [phone]

## 2016-08-25 MED ORDER — CLONIDINE HCL 0.1 MG PO TABS
0.1000 mg | ORAL_TABLET | Freq: Once | ORAL | Status: AC
  Administered 2016-08-25: 0.1 mg via ORAL
  Filled 2016-08-25: qty 1

## 2016-08-30 ENCOUNTER — Encounter: Payer: Self-pay | Admitting: Family Medicine

## 2016-08-30 ENCOUNTER — Ambulatory Visit (INDEPENDENT_AMBULATORY_CARE_PROVIDER_SITE_OTHER): Payer: Medicare HMO | Admitting: Family Medicine

## 2016-08-30 VITALS — BP 174/76 | HR 81 | Temp 98.3°F | Resp 16 | Ht 63.0 in | Wt 149.4 lb

## 2016-08-30 DIAGNOSIS — I10 Essential (primary) hypertension: Secondary | ICD-10-CM | POA: Diagnosis not present

## 2016-08-30 DIAGNOSIS — R9431 Abnormal electrocardiogram [ECG] [EKG]: Secondary | ICD-10-CM

## 2016-08-30 DIAGNOSIS — M79602 Pain in left arm: Secondary | ICD-10-CM | POA: Diagnosis not present

## 2016-08-30 MED ORDER — LISINOPRIL 20 MG PO TABS: 20.0000 mg | ORAL_TABLET | Freq: Every day | ORAL | 1 refills | Status: DC

## 2016-08-30 MED ORDER — AMLODIPINE BESYLATE 5 MG PO TABS: 7.5000 mg | ORAL_TABLET | Freq: Every day | ORAL | 0 refills | Status: DC

## 2016-08-30 NOTE — Progress Notes (Signed)
Name: Karen Davila   MRN: [id number]    DOB: 1918-06-03   Date:08/30/2016       Progress Note  Subjective  Chief Complaint  Chief Complaint  Patient presents with  . Hypertension    follow up ER elevated BP 200/97    Hypertension  This is a chronic problem. The problem has been gradually worsening since onset. The problem is uncontrolled. Pertinent negatives include no blurred vision, chest pain, headaches, orthopnea, palpitations or shortness of breath. Past treatments include calcium channel blockers and ACE inhibitors. There is no history of kidney disease, CAD/MI, CVA or heart failure.     Past Medical History:  Diagnosis Date  . Arthritis   . Chronic kidney disease   . GERD (gastroesophageal reflux disease)   . Hyperlipidemia   . Hypertension     Past Surgical History:  Procedure Laterality Date  . EYE SURGERY Right    Cataract    Family History  Problem Relation Age of Onset  . Cancer Brother     Social History   Social History  . Marital status: Single    Spouse name: N/A  . Number of children: N/A  . Years of education: N/A   Occupational History  . Not on file.   Social History Main Topics  . Smoking status: Never Smoker  . Smokeless tobacco: Never Used  . Alcohol use No  . Drug use: No  . Sexual activity: No   Other Topics Concern  . Not on file   Social History Narrative  . No narrative on file     Current Outpatient Prescriptions:  .  amLODipine (NORVASC) 5 MG tablet, Take 1 tablet (5 mg total) by mouth daily., Disp: 90 tablet, Rfl: 1 .  aspirin EC 81 MG tablet, Take 81 mg by mouth daily., Disp: , Rfl:  .  atorvastatin (LIPITOR) 10 MG tablet, Take 1 tablet (10 mg total) by mouth daily., Disp: 90 tablet, Rfl: 1 .  levothyroxine (SYNTHROID) 50 MCG tablet, Take 1 tablet (50 mcg total) by mouth daily before breakfast., Disp: 90 tablet, Rfl: 1 .  lisinopril (PRINIVIL,ZESTRIL) 20 MG tablet, Take 1 tablet (20 mg total) by mouth daily., Disp: 90  tablet, Rfl: 1 .  Olopatadine HCl 0.2 % SOLN, Apply 1 drop to eye every morning., Disp: 1 Bottle, Rfl: 0 .  omeprazole (PRILOSEC) 20 MG capsule, TAKE ONE CAPSULE BY MOUTH ONCE DAILY, Disp: 90 capsule, Rfl: 1  No Known Allergies   Review of Systems  Eyes: Negative for blurred vision.  Respiratory: Negative for shortness of breath.   Cardiovascular: Negative for chest pain, palpitations and orthopnea.  Neurological: Negative for headaches.      Objective  Vitals:   08/30/16 1023  BP: (!) 174/76  Pulse: 81  Resp: 16  Temp: 98.3 F (36.8 C)  TempSrc: Oral  SpO2: 95%  Weight: 149 lb 6.4 oz (67.8 kg)  Height: 5\' 3"  (1.6 m)    Physical Exam  Constitutional: She is well-developed, well-nourished, and in no distress.  Cardiovascular: Normal rate, regular rhythm, S1 normal and S2 normal.   Murmur heard.  Systolic murmur is present with a grade of 3/6  Pulmonary/Chest: Effort normal and breath sounds normal. No respiratory distress. She has no wheezes. She has no rhonchi.  Musculoskeletal:       Right ankle: She exhibits no swelling.       Left ankle: She exhibits no swelling. Tenderness.  Psychiatric: Mood, memory, affect and judgment normal.  Nursing note and vitals reviewed.    Assessment & Plan  1. Essential hypertension BP is poorly controlled, increase amlodipine to 7.5 mg daily, advised to keep BP logs and review in one month - amLODipine (NORVASC) 5 MG tablet; Take 1.5 tablets (7.5 mg total) by mouth daily.  Dispense: 135 tablet; Refill: 0 - lisinopril (PRINIVIL,ZESTRIL) 20 MG tablet; Take 1 tablet (20 mg total) by mouth daily.  Dispense: 90 tablet; Refill: 1 - Ambulatory referral to Cardiology  2. Left arm pain Obtain EKG to rule out cardiac etiology, EKG shows possible A. fib with normal rate, urgent referral to Cardiology placed - EKG 12-Lead - Ambulatory referral to Cardiology  3. Abnormal EKG  - Ambulatory referral to Cardiology   [facility] Asad A.  Faylene KurtzShah [facility] 08/30/2016 11:02 AM

## 2016-09-01 ENCOUNTER — Other Ambulatory Visit: Payer: Self-pay | Admitting: Family Medicine

## 2016-09-01 DIAGNOSIS — E038 Other specified hypothyroidism: Secondary | ICD-10-CM

## 2016-09-15 DIAGNOSIS — Z0100 Encounter for examination of eyes and vision without abnormal findings: Secondary | ICD-10-CM | POA: Diagnosis not present

## 2016-10-11 ENCOUNTER — Ambulatory Visit (INDEPENDENT_AMBULATORY_CARE_PROVIDER_SITE_OTHER): Payer: Medicare HMO | Admitting: Cardiovascular Disease

## 2016-10-11 ENCOUNTER — Encounter: Payer: Self-pay | Admitting: Cardiovascular Disease

## 2016-10-11 VITALS — BP 156/60 | HR 72 | Ht 65.0 in | Wt 149.5 lb

## 2016-10-11 DIAGNOSIS — I10 Essential (primary) hypertension: Secondary | ICD-10-CM

## 2016-10-11 DIAGNOSIS — R011 Cardiac murmur, unspecified: Secondary | ICD-10-CM | POA: Diagnosis not present

## 2016-10-11 NOTE — Progress Notes (Signed)
Cardiology Office Note   Date:  10/11/2016   ID:  Karen Davila, DOB 02-15-1918, MRN [id number]  PCP:  Brayton El, MD  Cardiologist:   Lorine Bears, MD   Chief Complaint  Patient presents with  . other    referred by Dr. Sherryll Burger for HTN, left arm pain, abnormal EKG.Patient was in the hospital 08/24/16. Pt states she is doing well. Reviewed meds with pt verbally.      History of Present Illness: Karen Davila is a 83 y.o. female who presents for a follow-up visit. She was seen by me few years ago for a cardiac murmur which was felt to be due to aortic sclerosis. She went to the emergency room in March for elevated blood pressure. Blood pressure was 200/66. Amlodipine was added by Dr. Sherryll Burger. EKG was suggestive of possible atrial fibrillation. However, after review, it was determined that P waves were present.She has been doing well and denies any chest pain, shortness of breath or palpitations. Blood pressure is more controlled now.   Past Medical History:  Diagnosis Date  . Arthritis   . Chronic kidney disease   . GERD (gastroesophageal reflux disease)   . Hyperlipidemia   . Hypertension     Past Surgical History:  Procedure Laterality Date  . EYE SURGERY Right    Cataract     Current Outpatient Prescriptions  Medication Sig Dispense Refill  . amLODipine (NORVASC) 5 MG tablet Take 1.5 tablets (7.5 mg total) by mouth daily. 135 tablet 0  . aspirin EC 81 MG tablet Take 81 mg by mouth daily.    [name] atorvastatin (LIPITOR) 10 MG tablet Take 1 tablet (10 mg total) by mouth daily. 90 tablet 1  . levothyroxine (SYNTHROID, LEVOTHROID) 50 MCG tablet TAKE ONE TABLET BY MOUTH ONCE DAILY BEFORE BREAKFAST 90 tablet 1  . lisinopril (PRINIVIL,ZESTRIL) 20 MG tablet Take 1 tablet (20 mg total) by mouth daily. 90 tablet 1  . Olopatadine HCl 0.2 % SOLN Apply 1 drop to eye every morning. 1 Bottle 0  . omeprazole (PRILOSEC) 20 MG capsule TAKE ONE CAPSULE BY MOUTH ONCE DAILY 90 capsule 1   No  current facility-administered medications for this visit.     Allergies:   Patient has no known allergies.    Social History:  The patient  reports that she has never smoked. She has never used smokeless tobacco. She reports that she does not drink alcohol or use drugs.   Family History:  The patient's family history includes Cancer in her brother.    ROS:  Please see the history of present illness.   Otherwise, review of systems are positive for none.   All other systems are reviewed and negative.    PHYSICAL EXAM: VS:  BP (!) 156/60 (BP Location: Left Arm, Patient Position: Sitting, Cuff Size: Normal)   Pulse 72   Ht  (1.651 m)   Wt 149 lb 8 oz (67.8 kg)   BMI 24.88 kg/m  , BMI Body mass index is 24.88 kg/m. GEN: Well nourished, well developed, in no acute distress  HEENT: normal  Neck: no JVD, carotid bruits, or masses Cardiac: RRR;  rubs, or gallops,no edema . 2/6 systolic ejection murmur in the aortic area which is early peaking  Respiratory:  clear to auscultation bilaterally, normal work of breathing GI: soft, nontender, nondistended, + BS MS: no deformity or atrophy  Skin: warm and dry, no rash Neuro:  Strength and sensation are intact Psych: euthymic  mood, full affect   EKG:  EKG is ordered today. The ekg ordered today demonstrates  normal sinus rhythm with first degree AV block.   Recent Labs: 07/06/2016: Magnesium 1.8; TSH 2.27 08/24/2016: ALT 13; BUN 12; Creatinine, Ser 1.08; Hemoglobin 9.8; Platelets 202; Potassium 3.6; Sodium 138    Lipid Panel    Component Value Date/Time   CHOL 167 07/06/2016 0859   CHOL 170 02/03/2015 0943   TRIG 144 07/06/2016 0859   HDL 59 07/06/2016 0859   HDL 61 02/03/2015 0943   CHOLHDL 2.8 07/06/2016 0859   VLDL 29 07/06/2016 0859   LDLCALC 79 07/06/2016 0859   LDLCALC 86 02/03/2015 0943      Wt Readings from Last 3 Encounters:  10/11/16 149 lb 8 oz (67.8 kg)  08/30/16 149 lb 6.4 oz (67.8 kg)  08/24/16 153 lb  (69.4 kg)       No flowsheet data found.    ASSESSMENT AND PLAN:  1.  Sinus arrhythmia: I reviewed the most recent EKG in March which was consistent with sinus arrhythmia. I do not see evidence of atrial fibrillation. EKG today is normal. No further workup if this is needed. She has no symptoms.  2. Essential hypertension: Blood pressure improved after adjusting her medications. Given her advanced age, recommend a target systolic blood pressure less than 160. If systolic blood pressure goes above 160, lisinopril can be increased to 40 mg once daily and/or amlodipine can be increased to 20 mg daily.  3. Cardiac murmur: This still seems to be stable from before and suggested aortic sclerosis with no significant stenosis.   Disposition:   FU with me as needed.   Signed,  Lorine Bears, MD  10/11/2016 4:01 PM    [facility]

## 2016-10-11 NOTE — Patient Instructions (Signed)
Medication Instructions: Continue same medications.   Labwork: None.   Procedures/Testing: None.   Follow-Up: As needed with Dr. Arida.   Any Additional Special Instructions Will Be Listed Below (If Applicable).     If you need a refill on your cardiac medications before your next appointment, please call your pharmacy.   

## 2016-11-24 DIAGNOSIS — H6123 Impacted cerumen, bilateral: Secondary | ICD-10-CM | POA: Diagnosis not present

## 2016-11-25 ENCOUNTER — Other Ambulatory Visit: Payer: Self-pay | Admitting: Family Medicine

## 2016-11-25 DIAGNOSIS — E785 Hyperlipidemia, unspecified: Secondary | ICD-10-CM

## 2016-11-25 NOTE — Telephone Encounter (Signed)
Last SGPT and lipids reviewed Rx approved 

## 2017-02-13 ENCOUNTER — Other Ambulatory Visit: Payer: Self-pay | Admitting: Family Medicine

## 2017-02-13 DIAGNOSIS — K219 Gastro-esophageal reflux disease without esophagitis: Secondary | ICD-10-CM

## 2017-02-13 DIAGNOSIS — E038 Other specified hypothyroidism: Secondary | ICD-10-CM

## 2017-02-15 ENCOUNTER — Other Ambulatory Visit: Payer: Self-pay | Admitting: Family Medicine

## 2017-02-15 DIAGNOSIS — E785 Hyperlipidemia, unspecified: Secondary | ICD-10-CM

## 2017-02-15 DIAGNOSIS — I10 Essential (primary) hypertension: Secondary | ICD-10-CM

## 2017-02-15 DIAGNOSIS — K219 Gastro-esophageal reflux disease without esophagitis: Secondary | ICD-10-CM

## 2017-02-15 DIAGNOSIS — E038 Other specified hypothyroidism: Secondary | ICD-10-CM

## 2017-02-17 NOTE — Telephone Encounter (Signed)
This is Dr. Margaretmary EddyShah's patient and needs follow up, please see if he wants to send refill

## 2017-02-27 ENCOUNTER — Other Ambulatory Visit: Payer: Self-pay | Admitting: Family Medicine

## 2017-02-27 DIAGNOSIS — E038 Other specified hypothyroidism: Secondary | ICD-10-CM

## 2017-02-27 DIAGNOSIS — K219 Gastro-esophageal reflux disease without esophagitis: Secondary | ICD-10-CM

## 2017-02-28 ENCOUNTER — Telehealth: Payer: Self-pay

## 2017-02-28 DIAGNOSIS — E038 Other specified hypothyroidism: Secondary | ICD-10-CM

## 2017-02-28 MED ORDER — LEVOTHYROXINE SODIUM 50 MCG PO TABS: ORAL_TABLET | ORAL | 0 refills | Status: DC

## 2017-02-28 NOTE — Telephone Encounter (Signed)
Medication has been refilled and sent to [facility] 

## 2017-03-03 ENCOUNTER — Encounter: Payer: Self-pay | Admitting: Family Medicine

## 2017-03-03 ENCOUNTER — Ambulatory Visit (INDEPENDENT_AMBULATORY_CARE_PROVIDER_SITE_OTHER): Payer: Medicare HMO | Admitting: Family Medicine

## 2017-03-03 DIAGNOSIS — K219 Gastro-esophageal reflux disease without esophagitis: Secondary | ICD-10-CM | POA: Diagnosis not present

## 2017-03-03 DIAGNOSIS — I10 Essential (primary) hypertension: Secondary | ICD-10-CM

## 2017-03-03 DIAGNOSIS — E7800 Pure hypercholesterolemia, unspecified: Secondary | ICD-10-CM | POA: Diagnosis not present

## 2017-03-03 DIAGNOSIS — E038 Other specified hypothyroidism: Secondary | ICD-10-CM | POA: Diagnosis not present

## 2017-03-03 LAB — LIPID PANEL
Cholesterol: 137 mg/dL (ref <200)
HDL: 61 mg/dL (ref >50)
LDL CHOLESTEROL (CALC): 56 mg/dL
NON-HDL CHOLESTEROL (CALC): 76 mg/dL (ref <130)
TRIGLYCERIDES: 114 mg/dL (ref <150)
Total CHOL/HDL Ratio: 2.2 (calc) (ref <5.0)

## 2017-03-03 LAB — TSH: TSH: 1.07 m[IU]/L (ref 0.40–4.50)

## 2017-03-03 MED ORDER — AMLODIPINE BESYLATE 5 MG PO TABS: 7.5000 mg | ORAL_TABLET | Freq: Every day | ORAL | 0 refills | Status: DC

## 2017-03-03 MED ORDER — ATORVASTATIN CALCIUM 10 MG PO TABS: 10.0000 mg | ORAL_TABLET | Freq: Every day | ORAL | 0 refills | Status: DC

## 2017-03-03 MED ORDER — OMEPRAZOLE 20 MG PO CPDR: 20.0000 mg | DELAYED_RELEASE_CAPSULE | Freq: Every day | ORAL | 1 refills | Status: DC

## 2017-03-03 MED ORDER — LEVOTHYROXINE SODIUM 50 MCG PO TABS: ORAL_TABLET | ORAL | 0 refills | Status: DC

## 2017-03-03 NOTE — Progress Notes (Signed)
Name: Karen Davila   MRN: [id number]    DOB: 28-Mar-1918   Date:03/03/2017       Progress Note  Subjective  Chief Complaint  Chief Complaint  Patient presents with  . Medication Refill        Hypertension  This is a chronic problem. The problem is unchanged. The problem is controlled. Pertinent negatives include no blurred vision, chest pain, headaches, orthopnea, palpitations or shortness of breath. Past treatments include ACE inhibitors and calcium channel blockers. There is no history of kidney disease, CAD/MI or CVA. Identifiable causes of hypertension include a thyroid problem.  Hyperlipidemia  This is a chronic problem. The problem is controlled. Recent lipid tests were reviewed and are normal. Pertinent negatives include no chest pain, leg pain, myalgias (she has occasional arthralgias) or shortness of breath. Current antihyperlipidemic treatment includes statins.  Gastroesophageal Reflux  She reports no abdominal pain, no belching, no chest pain, no coughing, no heartburn, no nausea or no sore throat. This is a chronic problem. The problem has been unchanged. Pertinent negatives include no fatigue. She has tried a PPI for the symptoms.  Thyroid Problem  Presents for follow-up visit. Patient reports no cold intolerance, constipation, depressed mood, dry skin, fatigue or palpitations. The symptoms have been stable. Her past medical history is significant for hyperlipidemia.      Past Medical History:  Diagnosis Date  . Arthritis   . Chronic kidney disease   . GERD (gastroesophageal reflux disease)   . Hyperlipidemia   . Hypertension     Past Surgical History:  Procedure Laterality Date  . EYE SURGERY Right    Cataract    Family History  Problem Relation Age of Onset  . Cancer Brother     Social History   Social History  . Marital status: Single    Spouse name: N/A  . Number of children: N/A  . Years of education: N/A   Occupational History  . Not on file.    Social History Main Topics  . Smoking status: Never Smoker  . Smokeless tobacco: Never Used  . Alcohol use No  . Drug use: No  . Sexual activity: No   Other Topics Concern  . Not on file   Social History Narrative  . No narrative on file     Current Outpatient Prescriptions:  .  aspirin EC 81 MG tablet, Take 81 mg by mouth daily., Disp: , Rfl:  .  atorvastatin (LIPITOR) 10 MG tablet, Take 1 tablet (10 mg total) by mouth daily., Disp: 90 tablet, Rfl: 0 .  levothyroxine (SYNTHROID, LEVOTHROID) 50 MCG tablet, TAKE ONE TABLET BY MOUTH ONCE DAILY BEFORE BREAKFAST, Disp: 90 tablet, Rfl: 0 .  lisinopril (PRINIVIL,ZESTRIL) 20 MG tablet, Take 1 tablet (20 mg total) by mouth daily., Disp: 90 tablet, Rfl: 1 .  Olopatadine HCl 0.2 % SOLN, Apply 1 drop to eye every morning., Disp: 1 Bottle, Rfl: 0 .  omeprazole (PRILOSEC) 20 MG capsule, TAKE ONE CAPSULE BY MOUTH ONCE DAILY, Disp: 90 capsule, Rfl: 1 .  amLODipine (NORVASC) 5 MG tablet, Take 1.5 tablets (7.5 mg total) by mouth daily., Disp: 135 tablet, Rfl: 0  No Known Allergies   Review of Systems  Constitutional: Negative for fatigue.  HENT: Negative for sore throat.   Eyes: Negative for blurred vision.  Respiratory: Negative for cough and shortness of breath.   Cardiovascular: Negative for chest pain, palpitations and orthopnea.  Gastrointestinal: Negative for abdominal pain, constipation, heartburn and nausea.  Musculoskeletal:  Negative for myalgias (she has occasional arthralgias).  Neurological: Negative for headaches.  Endo/Heme/Allergies: Negative for cold intolerance.      Objective  Vitals:   03/03/17 0954  BP: 126/82  Pulse: 77  Resp: 14  Temp: 97.9 F (36.6 C)  TempSrc: Oral  SpO2: 96%  Weight: 144 lb 6.4 oz (65.5 kg)  Height:  (1.651 m)    Physical Exam  Constitutional: She is well-developed, well-nourished, and in no distress.  HENT:  Head: Normocephalic and atraumatic.  Cardiovascular: Normal rate,  regular rhythm, S1 normal and S2 normal.   Murmur heard.  Systolic murmur is present with a grade of 3/6  Pulmonary/Chest: Effort normal and breath sounds normal. No respiratory distress. She has no wheezes. She has no rhonchi.  Musculoskeletal: She exhibits no edema.       Right ankle: She exhibits no swelling.       Left ankle: She exhibits no swelling. Tenderness.  Psychiatric: Mood, memory, affect and judgment normal.  Nursing note and vitals reviewed.     Assessment & Plan  1. Essential hypertension Stable on present antihypertensive treatment - amLODipine (NORVASC) 5 MG tablet; Take 1.5 tablets (7.5 mg total) by mouth daily.  Dispense: 135 tablet; Refill: 0  2. Gastroesophageal reflux disease, esophagitis presence not specified Symptoms of reflux are stable and controlled on PPI - omeprazole (PRILOSEC) 20 MG capsule; Take 1 capsule (20 mg total) by mouth daily.  Dispense: 90 capsule; Refill: 1  3. Other specified hypothyroidism  - levothyroxine (SYNTHROID, LEVOTHROID) 50 MCG tablet; TAKE ONE TABLET BY MOUTH ONCE DAILY BEFORE BREAKFAST  Dispense: 90 tablet; Refill: 0 - TSH  4. Pure hypercholesterolemia  - atorvastatin (LIPITOR) 10 MG tablet; Take 1 tablet (10 mg total) by mouth daily.  Dispense: 90 tablet; Refill: 0 - Lipid panel   Denym Rahimi [name] 03/03/2017 10:05 AM

## 2017-05-19 ENCOUNTER — Emergency Department: Payer: Medicare HMO

## 2017-05-19 ENCOUNTER — Other Ambulatory Visit: Payer: Self-pay

## 2017-05-19 ENCOUNTER — Emergency Department
Admission: EM | Admit: 2017-05-19 | Discharge: 2017-05-19 | Disposition: A | Discharge status: Home / Self Care | Payer: Medicare HMO | Attending: Emergency Medicine | Admitting: Emergency Medicine

## 2017-05-19 ENCOUNTER — Encounter: Payer: Self-pay | Admitting: Emergency Medicine

## 2017-05-19 DIAGNOSIS — L853 Xerosis cutis: Secondary | ICD-10-CM | POA: Diagnosis not present

## 2017-05-19 DIAGNOSIS — F0390 Unspecified dementia without behavioral disturbance: Secondary | ICD-10-CM | POA: Insufficient documentation

## 2017-05-19 DIAGNOSIS — Z7982 Long term (current) use of aspirin: Secondary | ICD-10-CM | POA: Diagnosis not present

## 2017-05-19 DIAGNOSIS — E039 Hypothyroidism, unspecified: Secondary | ICD-10-CM | POA: Diagnosis not present

## 2017-05-19 DIAGNOSIS — N183 Chronic kidney disease, stage 3 (moderate): Secondary | ICD-10-CM | POA: Insufficient documentation

## 2017-05-19 DIAGNOSIS — I129 Hypertensive chronic kidney disease with stage 1 through stage 4 chronic kidney disease, or unspecified chronic kidney disease: Secondary | ICD-10-CM | POA: Insufficient documentation

## 2017-05-19 DIAGNOSIS — M79605 Pain in left leg: Secondary | ICD-10-CM | POA: Diagnosis not present

## 2017-05-19 DIAGNOSIS — L299 Pruritus, unspecified: Secondary | ICD-10-CM | POA: Diagnosis present

## 2017-05-19 DIAGNOSIS — M79604 Pain in right leg: Secondary | ICD-10-CM | POA: Diagnosis not present

## 2017-05-19 NOTE — Discharge Instructions (Signed)
You are evaluated for intermittent discomfort in the legs as well as itching in the legs, and although no certain cause was found, your exam and evaluation are overall reassuring in the emergency department today.  As we discussed, I am most suspicious that your symptoms may be coming from dry skin.  Please use a moisture holding lotion after bathing such as Aquaphor or Vaseline to help with dry skin which could be causing itching.  I am recommending that he follow-up with your primary care doctor as well as back with the vein and vascular center as well.  Return to emerge department immediately for any new or worsening condition including pain, swelling, skin redness or rash, or any other symptoms concerning to you.

## 2017-05-19 NOTE — ED Notes (Signed)
Pt taken to US at this time

## 2017-05-19 NOTE — ED Triage Notes (Signed)
Pt to ED via POV. Pt states that she has been having itching and cramps in her legs. Pt states that last night she was having cramps in her thigh and had to get up and move around to help with the pain. Pt states that she was told before there was issues with the circulation in her legs. Pt in NAD at this time.

## 2017-05-19 NOTE — ED Provider Notes (Signed)
[facility] Emergency Department Provider Note ____________________________________________   I have reviewed the triage vital signs and the triage nursing note.  HISTORY  Chief Complaint Leg Pain   Historian Patient and godson in the room  HPI Karen Davila is a 83 y.o. female presents from home where she lives alone, reports itchy lower extremities and some discomfort up into the calves bilaterally.  States that she was previously told that she had "circulation problems.  "She states that she was seen by vein and vascular about a year ago and they indicated that nothing needed to be done.  For the past 1 month or so she has been experiencing some itching around her ankles.  No new swelling.  Some occasional cramps or pains into her calves.  Not exactly exertional.  No particular point time.  Nothing seems to make worse or better.  Symptoms are mild to moderate.   Past Medical History:  Diagnosis Date  . Arthritis   . Chronic kidney disease   . GERD (gastroesophageal reflux disease)   . Hyperlipidemia   . Hypertension     Patient Active Problem List   Diagnosis Date Noted  . Left arm pain 08/30/2016  . Bilateral leg cramps 07/06/2016  . Chronic kidney disease, stage 3 (HCC) 07/07/2015  . Cardiac murmur, previously undiagnosed 04/06/2015  . Dementia 04/06/2015  . Difficulty hearing 04/06/2015  . OP (osteoporosis) 04/06/2015  . Hypertension 12/23/2014  . GERD (gastroesophageal reflux disease) 12/23/2014  . Adult hypothyroidism 12/23/2014  . Hyperlipidemia 12/23/2014  . Iron deficiency anemia 12/23/2014    Past Surgical History:  Procedure Laterality Date  . EYE SURGERY Right    Cataract    Prior to Admission medications   Medication Sig Start Date End Date Taking? Authorizing Provider  amLODipine (NORVASC) 5 MG tablet Take 1.5 tablets (7.5 mg total) by mouth daily. 03/03/17 06/01/17 Yes Ellyn HackShah, Syed Asad A, MD  aspirin EC 81 MG tablet Take 81  mg by mouth daily.   Yes [provider]  atorvastatin (LIPITOR) 10 MG tablet Take 1 tablet (10 mg total) by mouth daily. 03/03/17  Yes Ellyn HackShah, Syed Asad A, MD  levothyroxine (SYNTHROID, LEVOTHROID) 50 MCG tablet TAKE ONE TABLET BY MOUTH ONCE DAILY BEFORE BREAKFAST 03/03/17  Yes Brayton ElShah, Syed Asad A, MD  lisinopril (PRINIVIL,ZESTRIL) 20 MG tablet Take 1 tablet (20 mg total) by mouth daily. 08/30/16  Yes Velta AddisonShah, Syed Asad A, MD  Olopatadine HCl 0.2 % SOLN Apply 1 drop to eye every morning. 05/24/16  Yes Triplett, Cari B, FNP  omeprazole (PRILOSEC) 20 MG capsule Take 1 capsule (20 mg total) by mouth daily. 03/03/17  Yes Ellyn HackShah, Syed Asad A, MD    No Known Allergies  Family History  Problem Relation Age of Onset  . Cancer Brother     Social History Social History   Tobacco Use  . Smoking status: Never Smoker  . Smokeless tobacco: Never Used  Substance Use Topics  . Alcohol use: No    Alcohol/week: 0.0 oz  . Drug use: No    Review of Systems  Constitutional: Negative for fever. Eyes: Negative for visual changes. ENT: Negative for sore throat. Cardiovascular: Negative for chest pain. Respiratory: Negative for shortness of breath. Gastrointestinal: Negative for abdominal pain, vomiting and diarrhea. Genitourinary: Negative for dysuria. Musculoskeletal: Negative for back pain. Skin: Negative for rash. Neurological: Negative for headache.  ____________________________________________   PHYSICAL EXAM:  VITAL SIGNS: ED Triage Vitals [05/19/17 0829]  Enc Vitals Group  BP (!) 167/68     Pulse Rate 74     Resp 16     Temp 98.2 F (36.8 C)     Temp Source Oral     SpO2 100 %     Weight 144 lb (65.3 kg)     Height 5\' 5"  (1.651 m)     Head Circumference      Peak Flow      Pain Score      Pain Loc      Pain Edu?      Excl. in GC?      Constitutional: Alert and oriented.  A little hard of hearing.  Well appearing and in no distress. HEENT   Head: Normocephalic and  atraumatic.      Eyes: Conjunctivae are normal. Pupils equal and round.       Ears:         Nose: No congestion/rhinnorhea.   Mouth/Throat: Mucous membranes are moist.   Neck: No stridor. Cardiovascular/Chest: Normal rate, regular rhythm.  No murmurs, rubs, or gallops.  Dorsalis pedis pulses strong bilateral lower extremities. Respiratory: Normal respiratory effort without tachypnea nor retractions. Breath sounds are clear and equal bilaterally. No wheezes/rales/rhonchi. Gastrointestinal: Soft. No distention, no guarding, no rebound. Nontender.    Genitourinary/rectal:Deferred Musculoskeletal: Nontender with normal range of motion in all extremities. No joint effusions.  Mild discomfort to the right calf more so than the left calf.  No particular swelling or lower extremity edema. Neurologic:  Normal speech and language. No gross or focal neurologic deficits are appreciated. Skin:  Skin is warm, dry and intact.  No evidence for cellulitis.  No evidence for necrosis.  Dry skin with some chronic venous insufficiency findings in terms of darkened skin color around both ankles. Psychiatric: Mood and affect are normal. Speech and behavior are normal. Patient exhibits appropriate insight and judgment.   ____________________________________________  LABS (pertinent positives/negatives) I, Governor Rooksebecca Lane Eland, MD the attending physician have reviewed the labs noted below.  Labs Reviewed - No data to display  ____________________________________________    EKG I, Governor Rooksebecca Cayla Wiegand, MD, the attending physician have personally viewed and interpreted all ECGs.  None ____________________________________________  RADIOLOGY All Xrays were viewed by me.  Imaging interpreted by Radiologist, and I, Governor Rooksebecca Ryot Burrous, MD the attending physician have reviewed the radiologist interpretation noted below.  Ultrasound lower extremity bilateral:  IMPRESSION: No evidence of deep venous  thrombosis. __________________________________________  PROCEDURES  Procedure(s) performed: None  Critical Care performed: None   ____________________________________________  ED COURSE / ASSESSMENT AND PLAN  Pertinent labs & imaging results that were available during my care of the patient were reviewed by me and considered in my medical decision making (see chart for details).   Ms. Charyl DancerGant is overall well-appearing, I did discuss with them no evidence for arterial insufficiency or necrotic toes, skin rash or cellulitis.  I am going to obtain ultrasound given some discomfort up into the calves.  It may be that the itching is related to some insufficiency and I will let her back to the vein and vascular center.  However I discussed with them that her skin is very dry in the area where she is complaining of it being itchy and to try some lotion to hold onto moisture.  Negative.  We will go ahead and discharge home  DIFFERENTIAL DIAGNOSIS: Including but not limited to cellulitis, peripheral edema, dry skin, hives, venous insufficiency, arterial insufficiency, DVT, etc.  CONSULTATIONS:   None   Patient /  Family / Caregiver informed of clinical course, medical decision-making process, and agree with plan.   I discussed return precautions, follow-up instructions, and discharge instructions with patient and/or family.  Discharge Instructions : You are evaluated for intermittent discomfort in the legs as well as itching in the legs, and although no certain cause was found, your exam and evaluation are overall reassuring in the emergency department today.  As we discussed, I am most suspicious that your symptoms may be coming from dry skin.  Please use a moisture holding lotion after bathing such as Aquaphor or Vaseline to help with dry skin which could be causing itching.  I am recommending that he follow-up with your primary care doctor as well as back with the vein and vascular center  as well.  Return to emerge department immediately for any new or worsening condition including pain, swelling, skin redness or rash, or any other symptoms concerning to you.    ___________________________________________   FINAL CLINICAL IMPRESSION(S) / ED DIAGNOSES   Final diagnoses:  Dry skin dermatitis      ___________________________________________        Note: This dictation was prepared with Dragon dictation. Any transcriptional errors that result from this process are unintentional    Governor Rooks, MD 05/19/17 1129

## 2017-05-30 ENCOUNTER — Encounter (INDEPENDENT_AMBULATORY_CARE_PROVIDER_SITE_OTHER): Payer: Self-pay | Admitting: Vascular Surgery

## 2017-05-30 ENCOUNTER — Ambulatory Visit (INDEPENDENT_AMBULATORY_CARE_PROVIDER_SITE_OTHER): Payer: Medicare HMO | Admitting: Vascular Surgery

## 2017-05-30 VITALS — BP 147/77 | HR 77 | Resp 16 | Ht 63.0 in | Wt 145.0 lb

## 2017-05-30 DIAGNOSIS — N183 Chronic kidney disease, stage 3 unspecified: Secondary | ICD-10-CM

## 2017-05-30 DIAGNOSIS — E785 Hyperlipidemia, unspecified: Secondary | ICD-10-CM | POA: Diagnosis not present

## 2017-05-30 DIAGNOSIS — I10 Essential (primary) hypertension: Secondary | ICD-10-CM | POA: Diagnosis not present

## 2017-05-30 DIAGNOSIS — M7989 Other specified soft tissue disorders: Secondary | ICD-10-CM

## 2017-05-30 NOTE — Progress Notes (Signed)
Patient ID: Karen Davila, female   DOB: October 08, 1917, 83 y.o.   MRN: [id number]  Chief Complaint  Patient presents with  . New Patient (Initial Visit)    ED one week follow up    HPI Karen Davila is a 83 y.o. female.  I am asked to see the patient by Dr. Shaune Pollack in the ER for evaluation of leg swelling and dry skin.  The patient reports this to be a chronic problem which has steadily worsened over several months to years.  There is no clear inciting event or causative factor that started the symptoms.  Both lower extremities are affected.  She does not have open ulcerations or wounds, although does sound like she has had these in the past.  Her swelling today is reasonably mild but it apparently gets worse intermittently.  No known history of DVT or superficial thrombophlebitis.  She has been told in the past that the blood was not returning well.  She went to the emergency department to have this evaluated about 2-3 weeks ago she says it is actually better now than it was at that time.  1 of her biggest complaints is how much it itches.  It is not necessarily painful, but very itchy.  She had a negative DVT study at that time   Past Medical History:  Diagnosis Date  . Arthritis   . Chronic kidney disease   . GERD (gastroesophageal reflux disease)   . Hyperlipidemia   . Hypertension     Past Surgical History:  Procedure Laterality Date  . EYE SURGERY Right    Cataract    Family History  Problem Relation Age of Onset  . Cancer Brother   No bleeding disorders, clotting disorders, or aneurysms  Social History Social History   Tobacco Use  . Smoking status: Never Smoker  . Smokeless tobacco: Never Used  Substance Use Topics  . Alcohol use: No    Alcohol/week: 0.0 oz  . Drug use: No     No Known Allergies  Current Outpatient Medications  Medication Sig Dispense Refill  . amLODipine (NORVASC) 5 MG tablet Take 1.5 tablets (7.5 mg total) by mouth daily. 135 tablet 0  .  aspirin EC 81 MG tablet Take 81 mg by mouth daily.    [name] atorvastatin (LIPITOR) 10 MG tablet Take 1 tablet (10 mg total) by mouth daily. 90 tablet 0  . levothyroxine (SYNTHROID, LEVOTHROID) 50 MCG tablet TAKE ONE TABLET BY MOUTH ONCE DAILY BEFORE BREAKFAST 90 tablet 0  . lisinopril (PRINIVIL,ZESTRIL) 20 MG tablet Take 1 tablet (20 mg total) by mouth daily. 90 tablet 1  . Olopatadine HCl 0.2 % SOLN Apply 1 drop to eye every morning. 1 Bottle 0  . omeprazole (PRILOSEC) 20 MG capsule Take 1 capsule (20 mg total) by mouth daily. 90 capsule 1   No current facility-administered medications for this visit.       REVIEW OF SYSTEMS (Negative unless checked)  Constitutional: [] Weight loss  [] Fever  [] Chills Cardiac: [] Chest pain   [] Chest pressure   [] Palpitations   [] Shortness of breath when laying flat   [] Shortness of breath at rest   [] Shortness of breath with exertion. Vascular:  [] Pain in legs with walking   [] Pain in legs at rest   [] Pain in legs when laying flat   [] Claudication   [] Pain in feet when walking  [] Pain in feet at rest  [] Pain in feet when laying flat   [] History of DVT   []   Phlebitis   [x] Swelling in legs   [x] Varicose veins   [] Non-healing ulcers Pulmonary:   [] Uses home oxygen   [] Productive cough   [] Hemoptysis   [] Wheeze  [] COPD   [] Asthma Neurologic:  [] Dizziness  [] Blackouts   [] Seizures   [] History of stroke   [] History of TIA  [] Aphasia   [] Temporary blindness   [] Dysphagia   [] Weakness or numbness in arms   [] Weakness or numbness in legs Musculoskeletal:  [x] Arthritis   [] Joint swelling   [] Joint pain   [] Low back pain Hematologic:  [] Easy bruising  [] Easy bleeding   [] Hypercoagulable state   [] Anemic  [] Hepatitis Gastrointestinal:  [] Blood in stool   [] Vomiting blood  [x] Gastroesophageal reflux/heartburn   [] Abdominal pain Genitourinary:  [x] Chronic kidney disease   [] Difficult urination  [] Frequent urination  [] Burning with urination   [] Hematuria Skin:  [] Rashes    [] Ulcers   [] Wounds Psychological:  [] History of anxiety   []  History of major depression.    Physical Exam BP (!) 147/77 (BP Location: Left Arm, Patient Position: Sitting)   Pulse 77   Resp 16   Ht 5\' 3"  (1.6 m)   Wt 65.8 kg (145 lb)   BMI 25.69 kg/m  Gen:  WD/WN, NAD.  Appears much younger than stated age Head: [place]/AT, No temporalis wasting.  Ear/Nose/Throat: Hearing grossly intact, nares w/o erythema or drainage, oropharynx w/o Erythema/Exudate Eyes: Conjunctiva clear, sclera non-icteric  Neck: trachea midline.  No JVD.  Pulmonary:  Good air movement, respirations not labored, no use of accessory muscles Cardiac: RRR, no JVD Vascular:  Vessel Right Left  Radial Palpable Palpable                          PT  not palpable  not palpable  DP  1+ palpable  1+ palpable   Gastrointestinal: soft, non-tender/non-distended.  Musculoskeletal: M/S 5/5 throughout.  Extremities without ischemic changes.  No deformity or atrophy.  1+ bilateral lower extremity edema.  Stasis changes are present with very dry scaling skin around the ankle, foot, and heels bilaterally Neurologic: Sensation grossly intact in extremities.  Symmetrical.  Speech is fluent. Motor exam as listed above. Psychiatric: Judgment intact, Mood & affect appropriate for pt's clinical situation. Dermatologic: No rashes or ulcers noted.  No cellulitis or open wounds.  Radiology [place] Venous Img Lower Bilateral  Result Date: 05/19/2017 CLINICAL DATA:  Leg pain, itching, cramps EXAM: BILATERAL LOWER EXTREMITY VENOUS DOPPLER ULTRASOUND TECHNIQUE: Gray-scale sonography with graded compression, as well as color Doppler and duplex ultrasound were performed to evaluate the lower extremity deep venous systems from the level of the common femoral vein and including the common femoral, femoral, profunda femoral, popliteal and calf veins including the posterior tibial, peroneal and gastrocnemius veins when visible. The superficial great  saphenous vein was also interrogated. Spectral Doppler was utilized to evaluate flow at rest and with distal augmentation maneuvers in the common femoral, femoral and popliteal veins. COMPARISON:  None. FINDINGS: RIGHT LOWER EXTREMITY Common Femoral Vein: No evidence of thrombus. Normal compressibility, respiratory phasicity and response to augmentation. Saphenofemoral Junction: No evidence of thrombus. Normal compressibility and flow on color Doppler imaging. Profunda Femoral Vein: No evidence of thrombus. Normal compressibility and flow on color Doppler imaging. Femoral Vein: No evidence of thrombus. Normal compressibility, respiratory phasicity and response to augmentation. Popliteal Vein: No evidence of thrombus. Normal compressibility, respiratory phasicity and response to augmentation. Calf Veins: No evidence of thrombus. Normal compressibility and flow on color  Doppler imaging. Superficial Great Saphenous Vein: No evidence of thrombus. Normal compressibility. Venous Reflux:  None. Other Findings:  None. LEFT LOWER EXTREMITY Common Femoral Vein: No evidence of thrombus. Normal compressibility, respiratory phasicity and response to augmentation. Saphenofemoral Junction: No evidence of thrombus. Normal compressibility and flow on color Doppler imaging. Profunda Femoral Vein: No evidence of thrombus. Normal compressibility and flow on color Doppler imaging. Femoral Vein: No evidence of thrombus. Normal compressibility, respiratory phasicity and response to augmentation. Popliteal Vein: No evidence of thrombus. Normal compressibility, respiratory phasicity and response to augmentation. Calf Veins: No evidence of thrombus. Normal compressibility and flow on color Doppler imaging. Superficial Great Saphenous Vein: No evidence of thrombus. Normal compressibility. Venous Reflux:  None. Other Findings:  None. IMPRESSION: No evidence of deep venous thrombosis. Electronically Signed   By: Judie PetitM.  Shick M.D.   On: 05/19/2017  11:04    Labs Recent Results (from the past 2160 hour(s))  Lipid panel     Status: None   Collection Time: 03/03/17 10:07 AM  Result Value Ref Range   Cholesterol 137 <200 mg/dL   HDL 61 >16>50 mg/dL   Triglycerides 109114 <604<150 mg/dL   LDL Cholesterol (Calc) 56 mg/dL (calc)    Comment: Reference range: <100 . Desirable range <100 mg/dL for primary prevention;   <70 mg/dL for patients with CHD or diabetic patients  with > or = 2 CHD risk factors. [name]. LDL-C is now calculated using the Martin-Hopkins  calculation, which is a validated novel method providing  better accuracy than the Friedewald equation in the  estimation of LDL-C.  Horald PollenMartin SS et al. Lenox AhrJAMA. 5409;811(912013;310(19): 2061-2068  (http://education.[facility].com/faq/FAQ164)    Total CHOL/HDL Ratio 2.2 <5.0 (calc)   Non-HDL Cholesterol (Calc) 76 <478<130 mg/dL (calc)    Comment: For patients with diabetes plus 1 major ASCVD risk  factor, treating to a non-HDL-C goal of <100 mg/dL  (LDL-C of <29<70 mg/dL) is considered a therapeutic  option.   TSH     Status: None   Collection Time: 03/03/17 10:07 AM  Result Value Ref Range   TSH 1.07 0.40 - 4.50 mIU/L    Assessment/Plan:  Hypertension blood pressure control important in reducing the progression of atherosclerotic disease. On appropriate oral medications.   Chronic kidney disease, stage 3 Could certainly increase her itching as well as her lower extremity swelling.  Hyperlipidemia lipid control important in reducing the progression of atherosclerotic disease. Continue statin therapy   Swelling of limb I have had a long discussion with the patient regarding swelling and why it  causes symptoms.  Patient will begin wearing graduated compression stockings class 1 (20-30 mmHg) on a daily basis a prescription was given. The patient will  beginning wearing the stockings first thing in the morning and removing them in the evening. The patient is instructed specifically not to sleep in  the stockings.   In addition, behavioral modification will be initiated.  This will include frequent elevation, use of over the counter pain medications and exercise such as walking.  I have reviewed systemic causes for chronic edema such as liver, kidney and cardiac etiologies.  The patient denies problems with these organ systems.    Consideration for a lymph pump will also be made based upon the effectiveness of conservative therapy.  This would help to improve the edema control and prevent sequela such as ulcers and infections   Patient should undergo duplex ultrasound of the venous system to ensure that DVT or reflux is not present.  The patient will follow-up with me after the ultrasound.        Festus Barren 05/30/2017, 4:02 PM   This note was created with Dragon medical transcription system.  Any errors from dictation are unintentional.

## 2017-05-30 NOTE — Assessment & Plan Note (Signed)

## 2017-05-30 NOTE — Assessment & Plan Note (Signed)
lipid control important in reducing the progression of atherosclerotic disease. Continue statin therapy  

## 2017-05-30 NOTE — Patient Instructions (Signed)
Stasis Dermatitis Stasis dermatitis is a long-term (chronic) skin condition that happens when veins can no longer pump blood back to the heart (poor circulation). This condition causes a red or brown scaly rash or sores (ulcers) from the pooling of blood (stasis). This condition usually affects the lower legs. It may affect one leg or both legs. Without treatment, severe stasis dermatitis can lead to other skin conditions and infections. What are the causes? This condition is caused by poor circulation. What increases the risk? This condition is more likely to develop in people who:  Are not very active.  Stand for long periods of time.  Have veins that have become enlarged and twisted (varicose veins).  Have leg veins that are not strong enough to send blood back to the heart (venous insufficiency).  Have had a blood clot.  Have been pregnant many times.  Have had vein surgery.  Are obese.  Have heart or kidney failure.  Are 50 years of age or older.  What are the signs or symptoms? Common early symptoms of this condition include:  Swelling in your ankle or leg. This might get better overnight but be worse again in the day.  Skin that looks thin on your ankle and leg.  Brown marks that develop slowly.  Skin that is easily irritated or cracked.  Red, swollen skin.  An achy or heavy feeling after you walk or stand for long periods of time.  Pain.  Later and more severe symptoms of this condition include:  Skin that looks shiny.  Small, open sores (ulcers). These are often red or purple.  Dry, cracking skin.  Skin that feels hard.  Severe itching.  A change in the shape or color of your lower legs.  Severe pain.  Difficulty walking.  How is this diagnosed? Your health care provider may suspect this condition from your symptoms and medical history. Your health care provider will also do a physical exam. You may need to see a health care provider who  specializes in skin diseases (dermatologist). You may also have tests to confirm the diagnosis, including:  Blood tests.  Imaging studies to check blood flow (Doppler ultrasound).  Allergy tests.  How is this treated? Treatment for this condition may include medicine, such as:  Corticosteroid creams and ointments.  Non-corticosteroid medicines applied to the skin (topical).  Medicine to reduce swelling in the legs (diuretics).  Antibiotics.  Medicine to relieve itching (antihistamines).  You may also have to wear:  Compression stockings or an elastic wrap to improve circulation.  A bandage (dressing).  A wrap that contains zinc and gelatin (Unna boot).  Follow these instructions at home: Skin Care  Moisturize your skin as told by your health care provider. Do not use moisturizers with fragrance. This can irritate your skin.  Apply cool compresses to the affected areas.  Do not scratch your skin.  Do not rub your skin dry after a bath or shower. Gently pat your skin dry.  Do not use scented soaps, detergents, or perfumes. Medicines  Take or use over-the-counter and prescription medicines only as told by your health care provider.  If you were prescribed an antibiotic medicine, take or use it as told by your health care provider. Do not stop taking or using the antibiotic even if your condition starts to improve. Lifestyle  Do not stand or sit in one position for long periods of time.  Do not cross your legs when you sit.  Raise (elevate) your   legs above the level of your heart when you are sitting or lying down.  Walk as told by your health care provider. Walking increases blood flow.  Wear comfortable, loose-fitting clothing. Circulation in your legs will be worse if you wear tight pants, belts, and waistbands. General instructions  Change and remove any dressing as told by your health care provider, if this applies.  Wear compression stockings as told by  your health care provider, if this applies. These stockings help to prevent blood clots and reduce swelling in your legs.  Wear the Unna boot as told by your health care provider, if this applies.  Keep all follow-up visits as told by your health care provider. This is important. Contact a health care provider if:  Your condition does not improve with treatment.  Your condition gets worse.  You have signs of infection in the affected area. Watch for: ? Swelling. ? Tenderness. ? Redness. ? Soreness. ? Warmth.  You have a fever. Get help right away if:  You notice red streaks coming from the affected area.  Your bone or joint underneath the affected area becomes painful after the skin has healed.  The affected area turns darker.  You feel a deep pain in your leg or groin.  You are short of breath. This information is not intended to replace advice given to you by your health care provider. Make sure you discuss any questions you have with your health care provider. Document Released: 09/08/2005 Document Revised: 01/26/2016 Document Reviewed: 10/15/2014 Elsevier Interactive Patient Education  2018 Elsevier Inc.  

## 2017-05-30 NOTE — Assessment & Plan Note (Signed)
Could certainly increase her itching as well as her lower extremity swelling.

## 2017-05-30 NOTE — Assessment & Plan Note (Signed)
blood pressure control important in reducing the progression of atherosclerotic disease. On appropriate oral medications.  

## 2017-06-02 ENCOUNTER — Ambulatory Visit: Payer: Self-pay | Admitting: Family Medicine

## 2017-06-08 ENCOUNTER — Encounter: Payer: Self-pay | Admitting: Family Medicine

## 2017-06-08 ENCOUNTER — Ambulatory Visit (INDEPENDENT_AMBULATORY_CARE_PROVIDER_SITE_OTHER): Payer: Medicare HMO | Admitting: Family Medicine

## 2017-06-08 VITALS — BP 142/76 | HR 71 | Temp 97.7°F | Resp 14 | Wt 145.8 lb

## 2017-06-08 DIAGNOSIS — R010 Benign and innocent cardiac murmurs: Secondary | ICD-10-CM | POA: Diagnosis not present

## 2017-06-08 DIAGNOSIS — E7800 Pure hypercholesterolemia, unspecified: Secondary | ICD-10-CM

## 2017-06-08 DIAGNOSIS — I10 Essential (primary) hypertension: Secondary | ICD-10-CM | POA: Diagnosis not present

## 2017-06-08 DIAGNOSIS — E038 Other specified hypothyroidism: Secondary | ICD-10-CM | POA: Diagnosis not present

## 2017-06-08 DIAGNOSIS — K219 Gastro-esophageal reflux disease without esophagitis: Secondary | ICD-10-CM

## 2017-06-08 MED ORDER — LISINOPRIL 20 MG PO TABS: 20.0000 mg | ORAL_TABLET | Freq: Every day | ORAL | 1 refills | Status: DC

## 2017-06-08 MED ORDER — LEVOTHYROXINE SODIUM 50 MCG PO TABS: ORAL_TABLET | ORAL | 0 refills | Status: DC

## 2017-06-08 MED ORDER — ATORVASTATIN CALCIUM 10 MG PO TABS: 10.0000 mg | ORAL_TABLET | Freq: Every day | ORAL | 0 refills | Status: DC

## 2017-06-08 MED ORDER — OMEPRAZOLE 20 MG PO CPDR: 20.0000 mg | DELAYED_RELEASE_CAPSULE | Freq: Every day | ORAL | 1 refills | Status: DC

## 2017-06-08 MED ORDER — AMLODIPINE BESYLATE 5 MG PO TABS: 7.5000 mg | ORAL_TABLET | Freq: Every day | ORAL | 0 refills | Status: DC

## 2017-06-08 NOTE — Progress Notes (Signed)
Name: Karen Davila   MRN: [id number]    DOB: 07/16/1917   Date:06/08/2017       Progress Note  Subjective  Chief Complaint  Chief Complaint  Patient presents with  . Hyperlipidemia    f/u  . Hypertension    f/u  . Medication Refill        Hyperlipidemia  This is a chronic problem. The problem is controlled. Recent lipid tests were reviewed and are normal. Pertinent negatives include no chest pain, leg pain or shortness of breath. Current antihyperlipidemic treatment includes statins.  Hypertension  This is a chronic problem. The problem is unchanged. The problem is controlled. Pertinent negatives include no blurred vision, chest pain, headaches, orthopnea, palpitations or shortness of breath. Past treatments include ACE inhibitors and calcium channel blockers. There is no history of kidney disease, CAD/MI or CVA. Identifiable causes of hypertension include a thyroid problem.  Gastroesophageal Reflux  She reports no belching, no chest pain, no heartburn, no nausea or no sore throat. This is a chronic problem. The problem has been unchanged. She has tried a PPI for the symptoms.  Thyroid Problem  Presents for follow-up visit. Patient reports no cold intolerance, constipation, depressed mood, dry skin or palpitations. The symptoms have been stable. Her past medical history is significant for hyperlipidemia.     Past Medical History:  Diagnosis Date  . Arthritis   . Chronic kidney disease   . GERD (gastroesophageal reflux disease)   . Hyperlipidemia   . Hypertension     Past Surgical History:  Procedure Laterality Date  . EYE SURGERY Right    Cataract    Family History  Problem Relation Age of Onset  . Cancer Brother     Social History   Socioeconomic History  . Marital status: Single    Spouse name: Not on file  . Number of children: Not on file  . Years of education: Not on file  . Highest education level: Not on file  Social Needs  . Financial resource  strain: Not on file  . Food insecurity - worry: Not on file  . Food insecurity - inability: Not on file  . Transportation needs - medical: Not on file  . Transportation needs - non-medical: Not on file  Occupational History  . Not on file  Tobacco Use  . Smoking status: Never Smoker  . Smokeless tobacco: Never Used  Substance and Sexual Activity  . Alcohol use: No    Alcohol/week: 0.0 oz  . Drug use: No  . Sexual activity: No  Other Topics Concern  . Not on file  Social History Narrative  . Not on file     Current Outpatient Medications:  .  amLODipine (NORVASC) 5 MG tablet, Take 1.5 tablets (7.5 mg total) by mouth daily., Disp: 135 tablet, Rfl: 0 .  aspirin EC 81 MG tablet, Take 81 mg by mouth daily., Disp: , Rfl:  .  atorvastatin (LIPITOR) 10 MG tablet, Take 1 tablet (10 mg total) by mouth daily., Disp: 90 tablet, Rfl: 0 .  levothyroxine (SYNTHROID, LEVOTHROID) 50 MCG tablet, TAKE ONE TABLET BY MOUTH ONCE DAILY BEFORE BREAKFAST, Disp: 90 tablet, Rfl: 0 .  lisinopril (PRINIVIL,ZESTRIL) 20 MG tablet, Take 1 tablet (20 mg total) by mouth daily., Disp: 90 tablet, Rfl: 1 .  Olopatadine HCl 0.2 % SOLN, Apply 1 drop to eye every morning., Disp: 1 Bottle, Rfl: 0 .  omeprazole (PRILOSEC) 20 MG capsule, Take 1 capsule (20 mg total) by mouth  daily., Disp: 90 capsule, Rfl: 1  No Known Allergies   Review of Systems  HENT: Negative for sore throat.   Eyes: Negative for blurred vision.  Respiratory: Negative for shortness of breath.   Cardiovascular: Negative for chest pain, palpitations and orthopnea.  Gastrointestinal: Negative for constipation, heartburn and nausea.  Neurological: Negative for headaches.  Endo/Heme/Allergies: Negative for cold intolerance.    Objective  Vitals:   06/08/17 1022  BP: (!) 142/76  Pulse: 71  Resp: 14  Temp: 97.7 F (36.5 C)  TempSrc: Oral  SpO2: 99%  Weight: 145 lb 12.8 oz (66.1 kg)    Physical Exam  Constitutional: She is oriented to  person, place, and time and well-developed, well-nourished, and in no distress.  HENT:  Head: Normocephalic and atraumatic.  Cardiovascular: Normal rate, regular rhythm, S1 normal and S2 normal.  Murmur heard.  Systolic murmur is present with a grade of 2/6. Pulmonary/Chest: Effort normal and breath sounds normal. She has no wheezes.  Abdominal: Soft. Bowel sounds are normal. There is no tenderness.  Musculoskeletal: She exhibits no edema.  Neurological: She is alert and oriented to person, place, and time.  Psychiatric: Mood, memory, affect and judgment normal.  Nursing note and vitals reviewed.     Assessment & Plan  1. Essential hypertension BP stable albeit marginally elevated, no change in pharmacotherapy, recheck in 3 months - amLODipine (NORVASC) 5 MG tablet; Take 1.5 tablets (7.5 mg total) by mouth daily.  Dispense: 135 tablet; Refill: 0 - lisinopril (PRINIVIL,ZESTRIL) 20 MG tablet; Take 1 tablet (20 mg total) by mouth daily.  Dispense: 90 tablet; Refill: 1  2. Gastroesophageal reflux disease, esophagitis presence not specified Symptoms of reflux are stable on PPI - omeprazole (PRILOSEC) 20 MG capsule; Take 1 capsule (20 mg total) by mouth daily.  Dispense: 90 capsule; Refill: 1  3. Other specified hypothyroidism  - levothyroxine (SYNTHROID, LEVOTHROID) 50 MCG tablet; TAKE ONE TABLET BY MOUTH ONCE DAILY BEFORE BREAKFAST  Dispense: 90 tablet; Refill: 0  4. Pure hypercholesterolemia Continue on statin, FLP at goal - atorvastatin (LIPITOR) 10 MG tablet; Take 1 tablet (10 mg total) by mouth daily.  Dispense: 90 tablet; Refill: 0  5. Benign heart murmur No symptoms, likely functional murmur, continued observation   Dain Laseter Asad A. Faylene KurtzShah [facility] 06/08/2017 10:40 AM

## 2017-07-11 ENCOUNTER — Ambulatory Visit (INDEPENDENT_AMBULATORY_CARE_PROVIDER_SITE_OTHER): Payer: Medicare HMO

## 2017-07-11 ENCOUNTER — Encounter (INDEPENDENT_AMBULATORY_CARE_PROVIDER_SITE_OTHER): Payer: Self-pay

## 2017-07-11 ENCOUNTER — Ambulatory Visit (INDEPENDENT_AMBULATORY_CARE_PROVIDER_SITE_OTHER): Payer: Medicare HMO | Admitting: Vascular Surgery

## 2017-07-11 DIAGNOSIS — R600 Localized edema: Secondary | ICD-10-CM

## 2017-07-11 DIAGNOSIS — M7989 Other specified soft tissue disorders: Secondary | ICD-10-CM

## 2017-07-14 ENCOUNTER — Encounter (INDEPENDENT_AMBULATORY_CARE_PROVIDER_SITE_OTHER): Payer: Self-pay | Admitting: Vascular Surgery

## 2017-07-14 ENCOUNTER — Ambulatory Visit (INDEPENDENT_AMBULATORY_CARE_PROVIDER_SITE_OTHER): Payer: Medicare HMO | Admitting: Vascular Surgery

## 2017-07-14 VITALS — BP 138/70 | HR 76 | Resp 16 | Wt 144.6 lb

## 2017-07-14 DIAGNOSIS — M7989 Other specified soft tissue disorders: Secondary | ICD-10-CM

## 2017-07-14 DIAGNOSIS — I10 Essential (primary) hypertension: Secondary | ICD-10-CM | POA: Diagnosis not present

## 2017-07-14 DIAGNOSIS — E785 Hyperlipidemia, unspecified: Secondary | ICD-10-CM

## 2017-07-14 DIAGNOSIS — N183 Chronic kidney disease, stage 3 unspecified: Secondary | ICD-10-CM

## 2017-07-14 NOTE — Assessment & Plan Note (Signed)
Her duplex today demonstrates no evidence of DVT, superficial thrombophlebitis, or significant venous disease of the right lower extremity. Elevate legs use compression stockings.  RTC PRN

## 2017-07-14 NOTE — Progress Notes (Signed)
MRN : [id number]  Karen Davila is a 84 y.o. (04-14-1918) female who presents with chief complaint of  Chief Complaint  Patient presents with  . Follow-up    ultrasound results  .  History of Present Illness: Patient returns today in follow up of leg swelling and itching.  Her legs are doing a little bit better than they were at her last visit.  Duration or infection.  She has no fever or chills.  Her duplex today demonstrates no evidence of DVT, superficial thrombophlebitis, or significant venous disease of the right lower extremity  Past Medical History:  Diagnosis Date  . Arthritis   . Chronic kidney disease   . GERD (gastroesophageal reflux disease)   . Hyperlipidemia   . Hypertension          Past Surgical History:  Procedure Laterality Date  . EYE SURGERY Right    Cataract         Family History  Problem Relation Age of Onset  . Cancer Brother   No bleeding disorders, clotting disorders, or aneurysms  Social History Social History        Tobacco Use  . Smoking status: Never Smoker  . Smokeless tobacco: Never Used  Substance Use Topics  . Alcohol use: No    Alcohol/week: 0.0 oz  . Drug use: No     No Known Allergies        Current Outpatient Medications  Medication Sig Dispense Refill  . amLODipine (NORVASC) 5 MG tablet Take 1.5 tablets (7.5 mg total) by mouth daily. 135 tablet 0  . aspirin EC 81 MG tablet Take 81 mg by mouth daily.    [name] atorvastatin (LIPITOR) 10 MG tablet Take 1 tablet (10 mg total) by mouth daily. 90 tablet 0  . levothyroxine (SYNTHROID, LEVOTHROID) 50 MCG tablet TAKE ONE TABLET BY MOUTH ONCE DAILY BEFORE BREAKFAST 90 tablet 0  . lisinopril (PRINIVIL,ZESTRIL) 20 MG tablet Take 1 tablet (20 mg total) by mouth daily. 90 tablet 1  . Olopatadine HCl 0.2 % SOLN Apply 1 drop to eye every morning. 1 Bottle 0  . omeprazole (PRILOSEC) 20 MG capsule Take 1 capsule (20 mg total) by mouth daily. 90 capsule 1   No  current facility-administered medications for this visit.       REVIEW OF SYSTEMS (Negative unless checked)  Constitutional: [] Weight loss  [] Fever  [] Chills Cardiac: [] Chest pain   [] Chest pressure   [] Palpitations   [] Shortness of breath when laying flat   [] Shortness of breath at rest   [] Shortness of breath with exertion. Vascular:  [] Pain in legs with walking   [] Pain in legs at rest   [] Pain in legs when laying flat   [] Claudication   [] Pain in feet when walking  [] Pain in feet at rest  [] Pain in feet when laying flat   [] History of DVT   [] Phlebitis   [x] Swelling in legs   [x] Varicose veins   [] Non-healing ulcers Pulmonary:   [] Uses home oxygen   [] Productive cough   [] Hemoptysis   [] Wheeze  [] COPD   [] Asthma Neurologic:  [] Dizziness  [] Blackouts   [] Seizures   [] History of stroke   [] History of TIA  [] Aphasia   [] Temporary blindness   [] Dysphagia   [] Weakness or numbness in arms   [] Weakness or numbness in legs Musculoskeletal:  [x] Arthritis   [] Joint swelling   [] Joint pain   [] Low back pain Hematologic:  [] Easy bruising  [] Easy bleeding   [] Hypercoagulable state   []   Anemic  [] Hepatitis Gastrointestinal:  [] Blood in stool   [] Vomiting blood  [x] Gastroesophageal reflux/heartburn   [] Abdominal pain Genitourinary:  [x] Chronic kidney disease   [] Difficult urination  [] Frequent urination  [] Burning with urination   [] Hematuria Skin:  [] Rashes   [] Ulcers   [] Wounds Psychological:  [] History of anxiety   []  History of major depression.    Physical Examination  BP 138/70 (BP Location: Right Arm)   Pulse 76   Resp 16   Wt 65.6 kg (144 lb 9.6 oz)   BMI 25.61 kg/m  Gen:  WD/WN, NAD.  Appears much younger than stated age Head: [place]/AT, No temporalis wasting. Ear/Nose/Throat: Hearing grossly intact, nares w/o erythema or drainage, trachea midline Eyes: Conjunctiva clear. Sclera non-icteric Neck: Supple.  No JVD.  Pulmonary:  Good air movement, no use of accessory muscles.  Cardiac:  RRR, normal S1, S2 Vascular:  Vessel Right Left  Radial Palpable Palpable                          PT Palpable Palpable  DP Palpable Palpable   Musculoskeletal: M/S 5/5 throughout.  No deformity or atrophy.  Trace lower extremity edema. Neurologic: Sensation grossly intact in extremities.  Symmetrical.  Speech is fluent.  Psychiatric: Judgment intact, Mood & affect appropriate for pt's clinical situation. Dermatologic: No rashes or ulcers noted.  No cellulitis or open wounds.       Labs No results found for this or any previous visit (from the past 2160 hour(s)).  Radiology No results found.   Assessment/Plan Hypertension blood pressure control important in reducing the progression of atherosclerotic disease. On appropriate oral medications.   Chronic kidney disease, stage 3 Could certainly increase her itching as well as her lower extremity swelling.  Hyperlipidemia lipid control important in reducing the progression of atherosclerotic disease. Continue statin therapy  Swelling of limb Her duplex today demonstrates no evidence of DVT, superficial thrombophlebitis, or significant venous disease of the right lower extremity. Elevate legs use compression stockings.  RTC PRN    Festus BarrenJason Toniann Dickerson, MD  07/14/2017 11:31 AM    This note was created with Dragon medical transcription system.  Any errors from dictation are purely unintentional

## 2017-08-08 ENCOUNTER — Ambulatory Visit (INDEPENDENT_AMBULATORY_CARE_PROVIDER_SITE_OTHER): Payer: Medicare HMO | Admitting: Family Medicine

## 2017-08-08 ENCOUNTER — Encounter: Payer: Self-pay | Admitting: Family Medicine

## 2017-08-08 VITALS — BP 134/80 | HR 73 | Temp 98.3°F | Wt 146.0 lb

## 2017-08-08 DIAGNOSIS — K219 Gastro-esophageal reflux disease without esophagitis: Secondary | ICD-10-CM

## 2017-08-08 DIAGNOSIS — I872 Venous insufficiency (chronic) (peripheral): Secondary | ICD-10-CM | POA: Diagnosis not present

## 2017-08-08 DIAGNOSIS — I10 Essential (primary) hypertension: Secondary | ICD-10-CM | POA: Diagnosis not present

## 2017-08-08 DIAGNOSIS — L299 Pruritus, unspecified: Secondary | ICD-10-CM

## 2017-08-08 DIAGNOSIS — N183 Chronic kidney disease, stage 3 unspecified: Secondary | ICD-10-CM

## 2017-08-08 DIAGNOSIS — M810 Age-related osteoporosis without current pathological fracture: Secondary | ICD-10-CM

## 2017-08-08 DIAGNOSIS — E039 Hypothyroidism, unspecified: Secondary | ICD-10-CM | POA: Diagnosis not present

## 2017-08-08 DIAGNOSIS — L280 Lichen simplex chronicus: Secondary | ICD-10-CM | POA: Diagnosis not present

## 2017-08-08 DIAGNOSIS — D509 Iron deficiency anemia, unspecified: Secondary | ICD-10-CM | POA: Diagnosis not present

## 2017-08-08 MED ORDER — TRUFORM STOCKINGS 20-30MMHG MISC: 1 refills | Status: DC

## 2017-08-08 MED ORDER — RANITIDINE HCL 150 MG PO TABS: 150.0000 mg | ORAL_TABLET | Freq: Every day | ORAL | 5 refills | Status: DC

## 2017-08-08 MED ORDER — TRIAMCINOLONE ACETONIDE 0.1 % EX CREA: 1.0000 "application " | TOPICAL_CREAM | Freq: Two times a day (BID) | CUTANEOUS | 1 refills | Status: DC

## 2017-08-08 NOTE — Patient Instructions (Addendum)
Please do call to schedule your bone density study; the number to schedule one at either [facility] or [facility] is [phone] or [phone]  STOP omeprazole (stomach pill) Start ranitidine (new gentler stomach pill)  Caution: prolonged use of proton pump inhibitors like omeprazole (Prilosec), pantoprazole (Protonix), esomeprazole (Nexium), and others like Dexilant and Aciphex may increase your risk of pneumonia, Clostridium difficile colitis, osteoporosis, anemia and other health complications Try to limit or avoid triggers like coffee, caffeinated beverages, onions, chocolate, spicy foods, peppermint, acidic foods like pizza, spaghetti sauce, and orange juice  Try elevating the head of your bed by placing a small wedge between your mattress and box springs to keep acid in the stomach at night instead of coming up into your esophagus   Chronic Venous Insufficiency Chronic venous insufficiency, also called venous stasis, is a condition that prevents blood from being pumped effectively through the veins in your legs. Blood may no longer be pumped effectively from the legs back to the heart. This condition can range from mild to severe. With proper treatment, you should be able to continue with an active life. What are the causes? Chronic venous insufficiency occurs when the vein walls become stretched, weakened, or damaged, or when valves within the vein are damaged. Some common causes of this include:  High blood pressure inside the veins (venous hypertension).  Increased blood pressure in the leg veins from long periods of sitting or standing.  A blood clot that blocks blood flow in a vein (deep vein thrombosis, DVT).  Inflammation of a vein (phlebitis) that causes a blood clot to form.  Tumors in the pelvis that cause blood to back up.  What increases the risk? The following factors may make you more likely to develop this condition:  Having a  family history of this condition.  Obesity.  Pregnancy.  Living without enough physical activity or exercise (sedentary lifestyle).  Smoking.  Having a job that requires long periods of standing or sitting in one place.  Being a certain age. Women in their 62s and 30s and men in their 67s are more likely to develop this condition.  What are the signs or symptoms? Symptoms of this condition include:  Veins that are enlarged, bulging, or twisted (varicose veins).  Skin breakdown or ulcers.  Reddened or discolored skin on the front of the leg.  Brown, smooth, tight, and painful skin just above the ankle, usually on the inside of the leg (lipodermatosclerosis).  Swelling.  How is this diagnosed? This condition may be diagnosed based on:  Your medical history.  A physical exam.  Tests, such as: ? A procedure that creates an image of a blood vessel and nearby organs and provides information about blood flow through the blood vessel (duplex ultrasound). ? A procedure that tests blood flow (plethysmography). ? A procedure to look at the veins using X-ray and dye (venogram).  How is this treated? The goals of treatment are to help you return to an active life and to minimize pain or disability. Treatment depends on the severity of your condition, and it may include:  Wearing compression stockings. These can help relieve symptoms and help prevent your condition from getting worse. However, they do not cure the condition.  Sclerotherapy. This is a procedure involving an injection of a material that "dissolves" damaged veins.  Surgery. This may involve: ? Removing a diseased vein (vein stripping). ? Cutting off blood flow through the vein (laser ablation surgery). ?  Repairing a valve.  Follow these instructions at home:  Wear compression stockings as told by your health care provider. These stockings help to prevent blood clots and reduce swelling in your legs.  Take  over-the-counter and prescription medicines only as told by your health care provider.  Stay active by exercising, walking, or doing different activities. Ask your health care provider what activities are safe for you and how much exercise you need.  Drink enough fluid to keep your urine clear or pale yellow.  Do not use any products that contain nicotine or tobacco, such as cigarettes and e-cigarettes. If you need help quitting, ask your health care provider.  Keep all follow-up visits as told by your health care provider. This is important. Contact a health care provider if:  You have redness, swelling, or more pain in the affected area.  You see a red streak or line that extends up or down from the affected area.  You have skin breakdown or a loss of skin in the affected area, even if the breakdown is small.  You get an injury in the affected area. Get help right away if:  You get an injury and an open wound in the affected area.  You have severe pain that does not get better with medicine.  You have sudden numbness or weakness in the foot or ankle below the affected area, or you have trouble moving your foot or ankle.  You have a fever and you have worse or persistent symptoms.  You have chest pain.  You have shortness of breath. Summary  Chronic venous insufficiency, also called venous stasis, is a condition that prevents blood from being pumped effectively through the veins in your legs.  Chronic venous insufficiency occurs when the vein walls become stretched, weakened, or damaged, or when valves within the vein are damaged.  Treatment for this condition depends on how severe your condition is, and it may involve wearing compression stockings or having a procedure.  Make sure you stay active by exercising, walking, or doing different activities. Ask your health care provider what activities are safe for you and how much exercise you need. This information is not  intended to replace advice given to you by your health care provider. Make sure you discuss any questions you have with your health care provider. Document Released: 10/03/2006 Document Revised: 04/18/2016 Document Reviewed: 04/18/2016 Elsevier Interactive Patient Education  2017 [employer]

## 2017-08-08 NOTE — Assessment & Plan Note (Signed)
Well-controlled; continue medicine 

## 2017-08-08 NOTE — Assessment & Plan Note (Signed)
Due for DEXA. 

## 2017-08-08 NOTE — Assessment & Plan Note (Signed)
Check labs today.

## 2017-08-08 NOTE — Progress Notes (Signed)
BP 134/80 (BP Location: Right Arm, Patient Position: Sitting, Cuff Size: Large)   Pulse 73   Temp 98.3 F (36.8 C) (Oral)   Wt 146 lb (66.2 kg)   SpO2 90%   BMI 25.86 kg/m    Subjective:    Patient ID: Karen Davila, female    DOB: 11-12-1917, 84 y.o.   MRN: [id number]  HPI: Karen Davila is a 84 y.o. female  Chief Complaint  Patient presents with  . Leg Pain    Bilateral, also with itching    HPI Patient is here as a new patient to me; previously seen by Dr. Sherryll Burger Pain is listed as a 10 out of 10 pain Bilateral leg pain She rubs ice on her legs and that helps She took some pain pills and that helped; maybe tylenol, something that reduces fever, didn't bring the medicine with her today; the pain medicine helps relieve her pain enough to get her to sleep Puts some salve on it, vaseline or grease; something to help with hurting and stinging Does not hurt when she walks  Was seen by vascular in December and February and has been to the ER; had scan Davila there Chronic problem, worsened over months to years Both legs; going on for two years; getting worse over time No hx of DVT; was told blood not returning well in the ER Another [place] by vascular ------------------------------------------ Copied from vascular History of Present Illness: Patient returns today in follow up of leg swelling and itching.  Her legs are doing a little bit better than they were at her last visit.  Duration or infection.  She has no fever or chills.  Her duplex today demonstrates no evidence of DVT, superficial thrombophlebitis, or significant venous disease of the right lower extremity His recommendations: elevate legs and use compression stockings ------------------------------------------ She wore the stockings for a week; she can't tell there is any swelling now Gets some cramps and takes mustard when she has those and it goes away Dry skin and itching Chronic anemia Hypothyroidism; weight  stable Lab Results  Component Value Date   TSH 1.07 03/03/2017  on PPI; no problems with stomach; willing to switch  Depression screen [facility] 2/9 08/08/2017 06/08/2017 08/22/2016 07/06/2016 01/11/2016  Decreased Interest 0 0 0 0 0  Down, Depressed, Hopeless 0 0 0 0 0  PHQ - 2 Score 0 0 0 0 0   Relevant past medical, surgical, family and social history reviewed Past Medical History:  Diagnosis Date  . Arthritis   . Chronic kidney disease   . GERD (gastroesophageal reflux disease)   . Hyperlipidemia   . Hypertension    Past Surgical History:  Procedure Laterality Date  . EYE SURGERY Right    Cataract   Family History  Problem Relation Age of Onset  . Cancer Brother    Social History   Tobacco Use  . Smoking status: Never Smoker  . Smokeless tobacco: Never Used  Substance Use Topics  . Alcohol use: No    Alcohol/week: 0.0 oz  . Drug use: No    Interim medical history since last visit reviewed. Allergies and medications reviewed  Review of Systems  Constitutional: Negative for fatigue.  Respiratory: Negative for shortness of breath.   Cardiovascular: Negative for chest pain.  Gastrointestinal: Positive for constipation (takes a pill every night for constipation).  Skin:       itching   Per HPI unless specifically indicated above     Objective:  BP 134/80 (BP Location: Right Arm, Patient Position: Sitting, Cuff Size: Large)   Pulse 73   Temp 98.3 F (36.8 C) (Oral)   Wt 146 lb (66.2 kg)   SpO2 90%   BMI 25.86 kg/m   Wt Readings from Last 3 Encounters:  08/08/17 146 lb (66.2 kg)  07/14/17 144 lb 9.6 oz (65.6 kg)  06/08/17 145 lb 12.8 oz (66.1 kg)    Physical Exam  Constitutional: She appears well-developed and well-nourished. No distress.  Appears 10-15 years younger than stated age  Eyes: EOM are normal. No scleral icterus.  Neck: Carotid bruit is not present. No thyroid mass (nontender) and no thyromegaly present.  Cardiovascular: Normal rate and  regular rhythm.  Pulses:      Dorsalis pedis pulses are 1+ on the right side, and 1+ on the left side.  Pulmonary/Chest: Effort normal and breath sounds normal.  Abdominal: She exhibits no distension.  Musculoskeletal:  Unable to elicit a Homan's sign; some discomfort in the left lateral thigh  Skin: No pallor.  Hyperpigmented changes over the anterior shins, symmetric; lichenification; no vesicles, no erythema, no oozing  Psychiatric: She has a normal mood and affect. Her behavior is normal. Judgment and thought content normal. Her mood appears not anxious. She does not exhibit a depressed mood.  Excellent historian; good eye contact with examiner    Results for orders placed or performed in visit on 03/03/17  Lipid panel  Result Value Ref Range   Cholesterol 137 <200 mg/dL   HDL 61 >09>50 mg/dL   Triglycerides 811114 <914<150 mg/dL   LDL Cholesterol (Calc) 56 mg/dL (calc)   Total CHOL/HDL Ratio 2.2 <5.0 (calc)   Non-HDL Cholesterol (Calc) 76 <782<130 mg/dL (calc)  TSH  Result Value Ref Range   TSH 1.07 0.40 - 4.50 mIU/L      Assessment & Plan:   Problem List Items Addressed This Visit      Cardiovascular and Mediastinum   Venous insufficiency - Primary    Already seen by vasc; elevate, wear stockings; return for f/u in 4 weeks      Hypertension    Well-controlled; continue medicine        Digestive   GERD (gastroesophageal reflux disease)    Recommend stopping PPI,change to a H2 blocker      Relevant Medications   ranitidine (ZANTAC) 150 MG tablet     Endocrine   Adult hypothyroidism    Check labs today      Relevant Orders   TSH     Musculoskeletal and Integument   OP (osteoporosis)    Due for DEXA      Relevant Orders   DG Bone Density   VITAMIN D 25 Hydroxy (Vit-D Deficiency, Fractures)   Lichen simplex chronicus     Genitourinary   Chronic kidney disease, stage 3 (HCC)    Check creatinine      Relevant Orders   COMPLETE METABOLIC PANEL WITH GFR      Other   Iron deficiency anemia    Check labs today      Relevant Orders   CBC with Differential/Platelet   Fe+TIBC+Fer    Other Visit Diagnoses    Pruritus       check vit D, start TAC       Follow up plan: Return in about 4 weeks (around 09/05/2017) for follow-up visit with Rolm BookbinderElizabeth Poulouse, AGNP-C.  An after-visit summary was printed and given to the patient at check-out.  Please see the  patient instructions which may contain other information and recommendations beyond what is mentioned above in the assessment and plan.  Meds ordered this encounter  Medications  . triamcinolone cream (KENALOG) 0.1 %    Sig: Apply 1 application topically 2 (two) times daily. (skin over the ankles and lower legs)    Dispense:  80 g    Refill:  1  . Elastic Bandages & Supports (TRUFORM STOCKINGS 20-30MMHG) MISC    Sig: Wear stockings, put on in the morning and remove at night    Dispense:  2 each    Refill:  1  . ranitidine (ZANTAC) 150 MG tablet    Sig: Take 1 tablet (150 mg total) by mouth at bedtime. For stomach    Dispense:  30 tablet    Refill:  5    STOP the omeprazole    Orders Placed This Encounter  Procedures  . DG Bone Density  . CBC with Differential/Platelet  . COMPLETE METABOLIC PANEL WITH GFR  . VITAMIN D 25 Hydroxy (Vit-D Deficiency, Fractures)  . TSH  . Fe+TIBC+Fer

## 2017-08-08 NOTE — Assessment & Plan Note (Signed)
Recommend stopping PPI,change to a H2 blocker

## 2017-08-08 NOTE — Assessment & Plan Note (Signed)
Check creatinine 

## 2017-08-08 NOTE — Assessment & Plan Note (Signed)
Already seen by vasc; elevate, wear stockings; return for f/u in 4 weeks

## 2017-08-09 LAB — CBC WITH DIFFERENTIAL/PLATELET
Basophils Absolute: 41 cells/uL (ref 0–200)
Basophils Relative: 0.9 %
Eosinophils Absolute: 92 cells/uL (ref 15–500)
Eosinophils Relative: 2 %
HCT: 30.8 % — ABNORMAL LOW (ref 35.0–45.0)
Hemoglobin: 9.8 g/dL — ABNORMAL LOW (ref 11.7–15.5)
Lymphs Abs: 1670 cells/uL (ref 850–3900)
MCH: 25.7 pg — ABNORMAL LOW (ref 27.0–33.0)
MCHC: 31.8 g/dL — ABNORMAL LOW (ref 32.0–36.0)
MCV: 80.6 fL (ref 80.0–100.0)
MPV: 10.9 fL (ref 7.5–12.5)
Monocytes Relative: 8.4 %
NEUTROS PCT: 52.4 %
Neutro Abs: 2410 cells/uL (ref 1500–7800)
Platelets: 239 10*3/uL (ref 140–400)
RBC: 3.82 10*6/uL (ref 3.80–5.10)
RDW: 14.1 % (ref 11.0–15.0)
Total Lymphocyte: 36.3 %
WBC mixed population: 386 cells/uL (ref 200–950)
WBC: 4.6 10*3/uL (ref 3.8–10.8)

## 2017-08-09 LAB — COMPLETE METABOLIC PANEL WITH GFR
AG RATIO: 1.5 (calc) (ref 1.0–2.5)
ALT: 13 U/L (ref 6–29)
AST: 21 U/L (ref 10–35)
Albumin: 4.4 g/dL (ref 3.6–5.1)
Alkaline phosphatase (APISO): 57 U/L (ref 33–130)
BUN/Creatinine Ratio: 13 (calc) (ref 6–22)
BUN: 17 mg/dL (ref 7–25)
CALCIUM: 9.5 mg/dL (ref 8.6–10.4)
CO2: 28 mmol/L (ref 20–32)
Chloride: 106 mmol/L (ref 98–110)
Creat: 1.3 mg/dL — ABNORMAL HIGH (ref 0.60–0.88)
GFR, EST AFRICAN AMERICAN: 40 mL/min/{1.73_m2} — AB (ref >=60)
GFR, EST NON AFRICAN AMERICAN: 34 mL/min/{1.73_m2} — AB (ref >=60)
Globulin: 2.9 g/dL (calc) (ref 1.9–3.7)
Glucose, Bld: 83 mg/dL (ref 65–139)
POTASSIUM: 4.3 mmol/L (ref 3.5–5.3)
SODIUM: 141 mmol/L (ref 135–146)
TOTAL PROTEIN: 7.3 g/dL (ref 6.1–8.1)
Total Bilirubin: 0.3 mg/dL (ref 0.2–1.2)

## 2017-08-09 LAB — IRON,TIBC AND FERRITIN PANEL
%SAT: 23 % (calc) (ref 11–50)
FERRITIN: 208 ng/mL (ref 20–288)
Iron: 68 ug/dL (ref 45–160)
TIBC: 292 mcg/dL (calc) (ref 250–450)

## 2017-08-09 LAB — TSH: TSH: 2.93 m[IU]/L (ref 0.40–4.50)

## 2017-08-09 LAB — VITAMIN D 25 HYDROXY (VIT D DEFICIENCY, FRACTURES): VIT D 25 HYDROXY: 49 ng/mL (ref 30–100)

## 2017-08-10 ENCOUNTER — Telehealth: Payer: Self-pay

## 2017-08-10 NOTE — Telephone Encounter (Signed)
-----   Message from Kerman PasseyMelinda P Lada, MD sent at 08/09/2017  7:39 AM EST ----- Please let the patient know that her anemia is stable; avoid NSAIDs; other labs are in expected ranges; thank you

## 2017-08-10 NOTE — Telephone Encounter (Signed)
Called pt no answer. Unable to leave message as no voicemail is set up. Will call back again. CRM created. [facility]

## 2017-08-16 NOTE — Telephone Encounter (Signed)
Called pt no answer. Called emergency contact who informed me that pt was not with him currently he ask that we call her again at another time. Pt does not have cell phone. Will mail letter.

## 2017-08-28 ENCOUNTER — Telehealth: Payer: Self-pay | Admitting: Family Medicine

## 2017-08-28 NOTE — Telephone Encounter (Signed)
Copied from CRM [id number]. Topic: Quick Communication - Lab Results >> Aug 28, 2017 10:03 AM Mare LoanBurton, Donna F wrote: Pt is calling back for lab results and she has the letter that was sent to patient and she has some questions  Best number [phone]

## 2017-08-28 NOTE — Telephone Encounter (Signed)
Copied from CRM [id number]. Topic: Quick Communication - Lab Results >> Aug 28, 2017 10:03 AM Mare LoanBurton, Donna F wrote: Pt is calling back for lab results and she has the letter that was sent to patient and she has some questions  Best number [phone]   Called number listed above no answer. Unable to leave message as no voicemail is set up. CRM created.

## 2017-09-06 ENCOUNTER — Ambulatory Visit: Payer: Self-pay | Admitting: Family Medicine

## 2017-09-19 DIAGNOSIS — I10 Essential (primary) hypertension: Secondary | ICD-10-CM | POA: Diagnosis not present

## 2017-09-19 DIAGNOSIS — Z9841 Cataract extraction status, right eye: Secondary | ICD-10-CM | POA: Diagnosis not present

## 2017-09-19 DIAGNOSIS — H25812 Combined forms of age-related cataract, left eye: Secondary | ICD-10-CM | POA: Diagnosis not present

## 2017-09-19 DIAGNOSIS — H5201 Hypermetropia, right eye: Secondary | ICD-10-CM | POA: Diagnosis not present

## 2017-09-19 DIAGNOSIS — H35342 Macular cyst, hole, or pseudohole, left eye: Secondary | ICD-10-CM | POA: Diagnosis not present

## 2017-09-19 DIAGNOSIS — H52221 Regular astigmatism, right eye: Secondary | ICD-10-CM | POA: Diagnosis not present

## 2017-09-19 DIAGNOSIS — Z961 Presence of intraocular lens: Secondary | ICD-10-CM | POA: Diagnosis not present

## 2017-10-03 ENCOUNTER — Ambulatory Visit (INDEPENDENT_AMBULATORY_CARE_PROVIDER_SITE_OTHER): Payer: Medicare HMO

## 2017-10-03 VITALS — BP 100/60 | HR 66 | Temp 98.1°F | Resp 12 | Ht 63.0 in | Wt 139.7 lb

## 2017-10-03 DIAGNOSIS — Z0000 Encounter for general adult medical examination without abnormal findings: Secondary | ICD-10-CM | POA: Diagnosis not present

## 2017-10-03 NOTE — Progress Notes (Addendum)
Subjective:   Karen Davila is a 84 y.o. female who presents for Medicare Annual (Subsequent) preventive examination.  Review of Systems:  N/A Cardiac Risk Factors include: sedentary lifestyle;advanced age (>16men, >58 women);dyslipidemia;hypertension     Objective:     Vitals: BP 100/60 (BP Location: Left Arm, Patient Position: Sitting, Cuff Size: Normal)   Pulse 66   Temp 98.1 F (36.7 C) (Oral)   Resp 12   Ht 5\' 3"  (1.6 m)   Wt 139 lb 11.2 oz (63.4 kg)   SpO2 97%   BMI 24.75 kg/m   Body mass index is 24.75 kg/m.  Advanced Directives 10/03/2017 05/19/2017 08/30/2016 08/22/2016 07/06/2016 03/01/2016 01/04/2016  Does Patient Have a Medical Advance Directive? No Yes No No No No No  Type of Advance Directive - Healthcare Power of Attorney - - - - -  Does patient want to make changes to medical advance directive? - No - Patient declined - - - - -  Copy of Healthcare Power of Attorney in Chart? - No - copy requested - - - - -  Would patient like information on creating a medical advance directive? Yes (MAU/Ambulatory/Procedural Areas - Information given) No - Patient declined - No - Patient declined - - No - patient declined information    Tobacco Social History   Tobacco Use  Smoking Status Never Smoker  Smokeless Tobacco Never Used  Tobacco Comment   smoking cessation materials not required     Counseling given: No Comment: smoking cessation materials not required   Clinical Intake:  Pre-visit preparation completed: Yes  Pain : No/denies pain   BMI - recorded: 24.75 Nutritional Status: BMI of 19-24  Normal Nutritional Risks: None Diabetes: No  How often do you need to have someone help you when you read instructions, pamphlets, or other written materials from your doctor or pharmacy?: 1 - Never  Interpreter Needed?: No  Information entered by :: AEversole, LPN   Hospitalizations/ED visits and surgeries occurring within the previous 12 months:  Within the  previous 12 months, pt has not underwent any surgical procedures and has not been hospitalized for any conditions. However, pt was seen 05/19/17 for dermatitis, treated by Dr. Shaune Pollack.  Past Medical History:  Diagnosis Date  . Arthritis   . Chronic kidney disease   . GERD (gastroesophageal reflux disease)   . Hyperlipidemia   . Hypertension    Past Surgical History:  Procedure Laterality Date  . EYE SURGERY Right    Cataract   Family History  Problem Relation Age of Onset  . Stroke Mother   . Healthy Father    Social History   Socioeconomic History  . Marital status: Single    Spouse name: Not on file  . Number of children: 0  . Years of education: Not on file  . Highest education level: 5th grade  Occupational History  . Occupation: Retired  Engineer, production  . Financial resource strain: Not hard at all  . Food insecurity:    Worry: Never true    Inability: Never true  . Transportation needs:    Medical: No    Non-medical: No  Tobacco Use  . Smoking status: Never Smoker  . Smokeless tobacco: Never Used  . Tobacco comment: smoking cessation materials not required  Substance and Sexual Activity  . Alcohol use: No    Alcohol/week: 0.0 oz  . Drug use: No  . Sexual activity: Not Currently  Lifestyle  . Physical activity:  Days per week: 0 days    Minutes per session: 0 min  . Stress: Not at all  Relationships  . Social connections:    Talks on phone: Patient refused    Gets together: Patient refused    Attends religious service: Patient refused    Active member of club or organization: Patient refused    Attends meetings of clubs or organizations: Patient refused    Relationship status: Patient refused  Other Topics Concern  . Not on file  Social History Narrative  . Not on file    Outpatient Encounter Medications as of 10/03/2017  Medication Sig  . amLODipine (NORVASC) 5 MG tablet Take 1.5 tablets (7.5 mg total) by mouth daily.  [name] aspirin EC 81 MG tablet  Take 81 mg by mouth daily.  [name] atorvastatin (LIPITOR) 10 MG tablet Take 1 tablet (10 mg total) by mouth daily.  . Elastic Bandages & Supports (TRUFORM STOCKINGS 20-30MMHG) MISC Wear stockings, put on in the morning and remove at night  . levothyroxine (SYNTHROID, LEVOTHROID) 50 MCG tablet TAKE ONE TABLET BY MOUTH ONCE DAILY BEFORE BREAKFAST  . lisinopril (PRINIVIL,ZESTRIL) 20 MG tablet Take 1 tablet (20 mg total) by mouth daily.  . Olopatadine HCl 0.2 % SOLN Apply 1 drop to eye every morning.  . ranitidine (ZANTAC) 150 MG tablet Take 1 tablet (150 mg total) by mouth at bedtime. For stomach  . triamcinolone cream (KENALOG) 0.1 % Apply 1 application topically 2 (two) times daily. (skin over the ankles and lower legs) (Patient not taking: Reported on 10/03/2017)   No facility-administered encounter medications on file as of 10/03/2017.     Activities of Daily Living In your present state of health, do you have any difficulty performing the following activities: 10/03/2017 08/08/2017  Hearing? N N  Comment R hearing aid -  Vision? N N  Comment wears eyeglasses -  Difficulty concentrating or making decisions? N N  Walking or climbing stairs? N N  Dressing or bathing? N N  Doing errands, shopping? Y N  Comment family transports -  Quarry manager and eating ? N -  Comment full set upper and lower dentures -  Using the Toilet? N -  In the past six months, have you accidently leaked urine? N -  Do you have problems with loss of bowel control? N -  Managing your Medications? N -  Managing your Finances? Y -  Comment family assists with finances -  Housekeeping or managing your Housekeeping? N -  Some recent data might be hidden    Patient Care Team: Lada, Janit Bern, MD as PCP - General (Family Medicine)    Assessment:   This is a routine wellness examination for [facility].  Exercise Activities and Dietary recommendations Current Exercise Habits: The patient does not participate in regular  exercise at present, Exercise limited by: None identified  Goals    . DIET - INCREASE WATER INTAKE     Recommend to drink at least 6-8 8oz glasses of water per day.       Fall Risk Fall Risk  10/03/2017 08/08/2017 06/08/2017 08/22/2016 07/06/2016  Falls in the past year? No No No No No  Risk for fall due to : - Impaired balance/gait - - Impaired balance/gait;Impaired vision   Is the home free of loose throw rugs in walkways, pet beds, electrical cords, etc? Yes Adequate lighting to reduce risk of falls?  Yes In addition, does the patient have any of the following: Stairs in  or around the home WITH handrails? N/A, one story home without stairs Grab bars in the bathroom? Yes  Shower chair or a place to sit while bathing? Yes Use of an elevated toilet seat or a handicapped toilet? Yes Use of a cane, walker or w/c? Yes, rolling walker and quad cane  Timed Get Up and Go Performed: Yes. Pt ambulated 10 feet within 20 sec. Gait slow, steady and with the use of an assistive device. No intervention required at this time. Fall risk prevention has been discussed.  Community Resource Referral not required at this time.  Depression Screen PHQ 2/9 Scores 10/03/2017 08/08/2017 06/08/2017 08/22/2016  PHQ - 2 Score 0 0 0 0  PHQ- 9 Score 0 - - -     Cognitive Function     6CIT Screen 10/03/2017 08/22/2016  What Year? 0 points 0 points  What month? 0 points 0 points  What time? 0 points 0 points  Count back from 20 0 points 0 points  Months in reverse 0 points 0 points  Repeat phrase 2 points 8 points  Total Score 2 8    Immunization History  Administered Date(s) Administered  . Influenza Split 03/24/2011    Qualifies for Shingles Vaccine? Yes. Due for Zostavax or Shingrix vaccine. Education has been provided regarding the importance of this vaccine. Pt has been advised to call her insurance company to determine her out of pocket expense. Advised she may also receive this vaccine at her local  pharmacy or Health Dept. Verbalized acceptance and understanding.  Overdue for Flu vaccine. Will be due prior to flu season.  Due for Pneumoccocal vaccines. Declined my offer to administer today. Education has been provided regarding the importance of this vaccine but still declined. States she is "going to let the good lord guide her and do what's best." Pt has been advised to call our office is she should change her mind and wish to receive this vaccine. Also advised she may receive this vaccine at her local pharmacy or Health Dept. Pt is aware to provide a copy of her vaccination record if she chooses to receive this vaccine at her local pharmacy. Verbalized acceptance and understanding.  Due for Tdap vaccine. Education has been provided regarding the importance of this vaccine. Pt has been advised she may receive this vaccine at her local pharmacy or Health Dept. Also advised to provide a copy of her vaccination record if she chooses to receive this vaccine at her local pharmacy. Verbalized acceptance and understanding.  Screening Tests Health Maintenance  Topic Date Due  . INFLUENZA VACCINE  02/08/2018 (Originally 01/11/2018)  . DEXA SCAN  06/13/2026 (Originally 10/11/1984)  . TETANUS/TDAP  06/13/2026 (Originally 10/12/1936)  . PNA vac Low Risk Adult (1 of 2 - PCV13) 06/13/2026 (Originally 10/11/1984)    Cancer Screenings: Lung: Low Dose CT Chest recommended if Age 81-80 years, 30 pack-year currently smoking OR have quit w/in 15years. Patient does not qualify. Breast Screening: No longer required Bone Density/Dexa: Dr. Sherie DonLada ordered this exam on 08/08/17. No results found. Pt states she has not yet scheduled this exam. Provided god son with contact information and advised to schedule appt. Verbalized acceptance and understanding. Colorectal: No longer required  Additional Screenings: Hepatitis C Screening: Does not qualify    Plan:  I have personally reviewed and addressed the Medicare Annual  Wellness questionnaire and have noted the following in the patient's chart:  A. Medical and social history B. Use of alcohol, tobacco or illicit drugs  C. Current medications and supplements D. Functional ability and status E.  Nutritional status F.  Physical activity G. Advance directives H. List of other physicians I.  Hospitalizations, surgeries, and ER visits in previous 12 months J.  Vitals K. Screenings such as hearing and vision if needed, cognitive and depression L. Referrals and appointments  In addition, I have reviewed and discussed with patient certain preventive protocols, quality metrics, and best practice recommendations. A written personalized care plan for preventive services as well as general preventive health recommendations were provided to patient.  See attached scanned questionnaire for additional information.   Signed,  Deon Pilling, LPN Nurse Health Advisor --------------------------------------------- I have reviewed this encounter including the documentation in this note and/or discussed this patient with the provider, Deon Pilling, LPN. I am certifying that I agree with the content of this note as supervising physician. Baruch Gouty, MD [facility] 10/15/2017, 8:55 PM

## 2017-10-03 NOTE — Patient Instructions (Signed)
Karen Davila , Thank you for taking time to come for your Medicare Wellness Visit. I appreciate your ongoing commitment to your health goals. Please review the following plan we discussed and let me know if I can assist you in the future.   Screening recommendations/referrals: Colorectal Screening: No longer required Mammogram: No longer required Bone Density: No longer required Lung Cancer Screening: You do not qualify for this screening Hepatitis C Screening: You do not qualify for this screening  Vision and Dental Exams: Recommended annual ophthalmology exams for early detection of glaucoma and other disorders of the eye Recommended annual dental exams for proper oral hygiene  Vaccinations: Influenza vaccine: Up to date Pneumococcal vaccine: Declined Tdap vaccine: Declined. Please call your insurance company to determine your out of pocket expense. You may also receive this vaccine at your local pharmacy or Health Dept. Shingles vaccine: Please call your insurance company to determine your out of pocket expense for the Shingrix vaccine. You may also receive this vaccine at your local pharmacy or Health Dept.    Advanced directives: Advance directive discussed with you today. I have provided a copy for you to complete at home and have notarized. Once this is complete please bring a copy in to our office so we can scan it into your chart.  Conditions/risks identified: Recommend to drink at least 6-8 8oz glasses of water per day.  Next appointment: Please schedule your Annual Wellness Visit with your Nurse Health Advisor in one year.  Preventive Care 21 Years and Older, Female Preventive care refers to lifestyle choices and visits with your health care provider that can promote health and wellness. What does preventive care include?  A yearly physical exam. This is also called an annual well check.  Dental exams once or twice a year.  Routine eye exams. Ask your health care provider how  often you should have your eyes checked.  Personal lifestyle choices, including:  Daily care of your teeth and gums.  Regular physical activity.  Eating a healthy diet.  Avoiding tobacco and drug use.  Limiting alcohol use.  Practicing safe sex.  Taking low-dose aspirin every day.  Taking vitamin and mineral supplements as recommended by your health care provider. What happens during an annual well check? The services and screenings done by your health care provider during your annual well check will depend on your age, overall health, lifestyle risk factors, and family history of disease. Counseling  Your health care provider may ask you questions about your:  Alcohol use.  Tobacco use.  Drug use.  Emotional well-being.  Home and relationship well-being.  Sexual activity.  Eating habits.  History of falls.  Memory and ability to understand (cognition).  Work and work Astronomer.  Reproductive health. Screening  You may have the following tests or measurements:  Height, weight, and BMI.  Blood pressure.  Lipid and cholesterol levels. These may be checked every 5 years, or more frequently if you are over 30 years old.  Skin check.  Lung cancer screening. You may have this screening every year starting at age 19 if you have a 30-pack-year history of smoking and currently smoke or have quit within the past 15 years.  Fecal occult blood test (FOBT) of the stool. You may have this test every year starting at age 63.  Flexible sigmoidoscopy or colonoscopy. You may have a sigmoidoscopy every 5 years or a colonoscopy every 10 years starting at age 84.  Hepatitis C blood test.  Hepatitis B  blood test.  Sexually transmitted disease (STD) testing.  Diabetes screening. This is done by checking your blood sugar (glucose) after you have not eaten for a while (fasting). You may have this done every 1-3 years.  Bone density scan. This is done to screen for  osteoporosis. You may have this done starting at age 82.  Mammogram. This may be done every 1-2 years. Talk to your health care provider about how often you should have regular mammograms. Talk with your health care provider about your test results, treatment options, and if necessary, the need for more tests. Vaccines  Your health care provider may recommend certain vaccines, such as:  Influenza vaccine. This is recommended every year.  Tetanus, diphtheria, and acellular pertussis (Tdap, Td) vaccine. You may need a Td booster every 10 years.  Zoster vaccine. You may need this after age 82.  Pneumococcal 13-valent conjugate (PCV13) vaccine. One dose is recommended after age 82.  Pneumococcal polysaccharide (PPSV23) vaccine. One dose is recommended after age 82. Talk to your health care provider about which screenings and vaccines you need and how often you need them. This information is not intended to replace advice given to you by your health care provider. Make sure you discuss any questions you have with your health care provider. Document Released: 06/26/2015 Document Revised: 02/17/2016 Document Reviewed: 03/31/2015 Elsevier Interactive Patient Education  2017 [employer]  Fall Prevention in the Home Falls can cause injuries. They can happen to people of all ages. There are many things you can do to make your home safe and to help prevent falls. What can I do on the outside of my home?  Regularly fix the edges of walkways and driveways and fix any cracks.  Remove anything that might make you trip as you walk through a door, such as a raised step or threshold.  Trim any bushes or trees on the path to your home.  Use bright outdoor lighting.  Clear any walking paths of anything that might make someone trip, such as rocks or tools.  Regularly check to see if handrails are loose or broken. Make sure that both sides of any steps have handrails.  Any raised decks and porches  should have guardrails on the edges.  Have any leaves, snow, or ice cleared regularly.  Use sand or salt on walking paths during winter.  Clean up any spills in your garage right away. This includes oil or grease spills. What can I do in the bathroom?  Use night lights.  Install grab bars by the toilet and in the tub and shower. Do not use towel bars as grab bars.  Use non-skid mats or decals in the tub or shower.  If you need to sit down in the shower, use a plastic, non-slip stool.  Keep the floor dry. Clean up any water that spills on the floor as soon as it happens.  Remove soap buildup in the tub or shower regularly.  Attach bath mats securely with double-sided non-slip rug tape.  Do not have throw rugs and other things on the floor that can make you trip. What can I do in the bedroom?  Use night lights.  Make sure that you have a light by your bed that is easy to reach.  Do not use any sheets or blankets that are too big for your bed. They should not hang down onto the floor.  Have a firm chair that has side arms. You can use this for support  while you get dressed.  Do not have throw rugs and other things on the floor that can make you trip. What can I do in the kitchen?  Clean up any spills right away.  Avoid walking on wet floors.  Keep items that you use a lot in easy-to-reach places.  If you need to reach something above you, use a strong step stool that has a grab bar.  Keep electrical cords out of the way.  Do not use floor polish or wax that makes floors slippery. If you must use wax, use non-skid floor wax.  Do not have throw rugs and other things on the floor that can make you trip. What can I do with my stairs?  Do not leave any items on the stairs.  Make sure that there are handrails on both sides of the stairs and use them. Fix handrails that are broken or loose. Make sure that handrails are as long as the stairways.  Check any carpeting to  make sure that it is firmly attached to the stairs. Fix any carpet that is loose or worn.  Avoid having throw rugs at the top or bottom of the stairs. If you do have throw rugs, attach them to the floor with carpet tape.  Make sure that you have a light switch at the top of the stairs and the bottom of the stairs. If you do not have them, ask someone to add them for you. What else can I do to help prevent falls?  Wear shoes that:  Do not have high heels.  Have rubber bottoms.  Are comfortable and fit you well.  Are closed at the toe. Do not wear sandals.  If you use a stepladder:  Make sure that it is fully opened. Do not climb a closed stepladder.  Make sure that both sides of the stepladder are locked into place.  Ask someone to hold it for you, if possible.  Clearly mark and make sure that you can see:  Any grab bars or handrails.  First and last steps.  Where the edge of each step is.  Use tools that help you move around (mobility aids) if they are needed. These include:  Canes.  Walkers.  Scooters.  Crutches.  Turn on the lights when you go into a dark area. Replace any light bulbs as soon as they burn out.  Set up your furniture so you have a clear path. Avoid moving your furniture around.  If any of your floors are uneven, fix them.  If there are any pets around you, be aware of where they are.  Review your medicines with your doctor. Some medicines can make you feel dizzy. This can increase your chance of falling. Ask your doctor what other things that you can do to help prevent falls. This information is not intended to replace advice given to you by your health care provider. Make sure you discuss any questions you have with your health care provider. Document Released: 03/26/2009 Document Revised: 11/05/2015 Document Reviewed: 07/04/2014 Elsevier Interactive Patient Education  2017 [employer].

## 2017-10-18 ENCOUNTER — Other Ambulatory Visit: Payer: Self-pay

## 2017-10-18 DIAGNOSIS — E7800 Pure hypercholesterolemia, unspecified: Secondary | ICD-10-CM

## 2017-10-18 DIAGNOSIS — E038 Other specified hypothyroidism: Secondary | ICD-10-CM

## 2017-10-18 MED ORDER — ATORVASTATIN CALCIUM 10 MG PO TABS: 10.0000 mg | ORAL_TABLET | Freq: Every day | ORAL | 0 refills | Status: DC

## 2017-10-18 MED ORDER — LEVOTHYROXINE SODIUM 50 MCG PO TABS: ORAL_TABLET | ORAL | 0 refills | Status: DC

## 2017-10-31 ENCOUNTER — Ambulatory Visit
Admission: RE | Admit: 2017-10-31 | Discharge: 2017-10-31 | Disposition: A | Discharge status: Home / Self Care | Payer: Medicare HMO | Source: Ambulatory Visit | Attending: Family Medicine | Admitting: Family Medicine

## 2017-10-31 DIAGNOSIS — M810 Age-related osteoporosis without current pathological fracture: Secondary | ICD-10-CM | POA: Diagnosis not present

## 2017-10-31 DIAGNOSIS — Z780 Asymptomatic menopausal state: Secondary | ICD-10-CM | POA: Diagnosis not present

## 2017-10-31 DIAGNOSIS — M85852 Other specified disorders of bone density and structure, left thigh: Secondary | ICD-10-CM | POA: Diagnosis not present

## 2017-11-01 ENCOUNTER — Telehealth: Payer: Self-pay

## 2017-11-01 DIAGNOSIS — M810 Age-related osteoporosis without current pathological fracture: Secondary | ICD-10-CM

## 2017-11-01 NOTE — Telephone Encounter (Signed)
-----   Message from Kerman Passey, MD sent at 10/31/2017  7:15 PM EDT ----- Asher Muir, please let pt know that her bone scan does in fact show osteoporosis; please encourage fall prevention; REFER to endocrinology for management, dx osteoporosis

## 2017-11-02 ENCOUNTER — Ambulatory Visit (INDEPENDENT_AMBULATORY_CARE_PROVIDER_SITE_OTHER): Payer: Medicare HMO | Admitting: Nurse Practitioner

## 2017-11-02 ENCOUNTER — Encounter: Payer: Self-pay | Admitting: Nurse Practitioner

## 2017-11-02 VITALS — BP 122/64 | HR 79 | Temp 98.6°F | Resp 18 | Ht 63.0 in | Wt 144.5 lb

## 2017-11-02 DIAGNOSIS — M810 Age-related osteoporosis without current pathological fracture: Secondary | ICD-10-CM

## 2017-11-02 DIAGNOSIS — M25511 Pain in right shoulder: Secondary | ICD-10-CM | POA: Diagnosis not present

## 2017-11-02 MED ORDER — LIDOCAINE 0.5 % EX GEL: 1.0000 "application " | Freq: Every day | CUTANEOUS | 0 refills | Status: DC

## 2017-11-02 NOTE — Patient Instructions (Addendum)
- Make sure your bone pill is a calcium and vitamin D supplement - Dr. Sherie Don referred you to an endocrinologist for managing osteoporosis- you will get a call from them - use tylenol and heat or lidocaine gel when you have your shoulder pain ( dont take more than 3,000mg  of tylenol)   Fall Prevention in the Home Falls can cause injuries. They can happen to people of all ages. There are many things you can do to make your home safe and to help prevent falls. What can I do on the outside of my home?  Regularly fix the edges of walkways and driveways and fix any cracks.  Remove anything that might make you trip as you walk through a door, such as a raised step or threshold.  Trim any bushes or trees on the path to your home.  Use bright outdoor lighting.  Clear any walking paths of anything that might make someone trip, such as rocks or tools.  Regularly check to see if handrails are loose or broken. Make sure that both sides of any steps have handrails.  Any raised decks and porches should have guardrails on the edges.  Have any leaves, snow, or ice cleared regularly.  Use sand or salt on walking paths during winter.  Clean up any spills in your garage right away. This includes oil or grease spills. What can I do in the bathroom?  Use night lights.  Install grab bars by the toilet and in the tub and shower. Do not use towel bars as grab bars.  Use non-skid mats or decals in the tub or shower.  If you need to sit down in the shower, use a plastic, non-slip stool.  Keep the floor dry. Clean up any water that spills on the floor as soon as it happens.  Remove soap buildup in the tub or shower regularly.  Attach bath mats securely with double-sided non-slip rug tape.  Do not have throw rugs and other things on the floor that can make you trip. What can I do in the bedroom?  Use night lights.  Make sure that you have a light by your bed that is easy to reach.  Do not use  any sheets or blankets that are too big for your bed. They should not hang down onto the floor.  Have a firm chair that has side arms. You can use this for support while you get dressed.  Do not have throw rugs and other things on the floor that can make you trip. What can I do in the kitchen?  Clean up any spills right away.  Avoid walking on wet floors.  Keep items that you use a lot in easy-to-reach places.  If you need to reach something above you, use a strong step stool that has a grab bar.  Keep electrical cords out of the way.  Do not use floor polish or wax that makes floors slippery. If you must use wax, use non-skid floor wax.  Do not have throw rugs and other things on the floor that can make you trip. What can I do with my stairs?  Do not leave any items on the stairs.  Make sure that there are handrails on both sides of the stairs and use them. Fix handrails that are broken or loose. Make sure that handrails are as long as the stairways.  Check any carpeting to make sure that it is firmly attached to the stairs. Fix any carpet that  is loose or worn.  Avoid having throw rugs at the top or bottom of the stairs. If you do have throw rugs, attach them to the floor with carpet tape.  Make sure that you have a light switch at the top of the stairs and the bottom of the stairs. If you do not have them, ask someone to add them for you. What else can I do to help prevent falls?  Wear shoes that: ? Do not have high heels. ? Have rubber bottoms. ? Are comfortable and fit you well. ? Are closed at the toe. Do not wear sandals.  If you use a stepladder: ? Make sure that it is fully opened. Do not climb a closed stepladder. ? Make sure that both sides of the stepladder are locked into place. ? Ask someone to hold it for you, if possible.  Clearly mark and make sure that you can see: ? Any grab bars or handrails. ? First and last steps. ? Where the edge of each step  is.  Use tools that help you move around (mobility aids) if they are needed. These include: ? Canes. ? Walkers. ? Scooters. ? Crutches.  Turn on the lights when you go into a dark area. Replace any light bulbs as soon as they burn out.  Set up your furniture so you have a clear path. Avoid moving your furniture around.  If any of your floors are uneven, fix them.  If there are any pets around you, be aware of where they are.  Review your medicines with your doctor. Some medicines can make you feel dizzy. This can increase your chance of falling. Ask your doctor what other things that you can do to help prevent falls. This information is not intended to replace advice given to you by your health care provider. Make sure you discuss any questions you have with your health care provider. Document Released: 03/26/2009 Document Revised: 11/05/2015 Document Reviewed: 07/04/2014 Elsevier Interactive Patient Education  2018 [employer].   Osteoporosis Osteoporosis happens when your bones become thinner and weaker. Weak bones can break (fracture) more easily when you slip or fall. Bones most at risk of breaking are in the hip, wrist, and spine. Follow these instructions at home:  Get enough calcium and vitamin D. These nutrients are good for your bones.  Exercise as told by your doctor.  Do not use any tobacco products. This includes cigarettes, chewing tobacco, and electronic cigarettes. If you need help quitting, ask your doctor.  Limit the amount of alcohol you drink.  Take medicines only as told by your doctor.  Keep all follow-up visits as told by your doctor. This is important.  Take care at home to prevent falls. Some ways to do this are: ? Keep rooms well lit and tidy. ? Put safety rails on your stairs. ? Put a rubber mat in the bathroom and other places that are often wet or slippery. Get help right away if:  You fall.  You hurt yourself. This information is not  intended to replace advice given to you by your health care provider. Make sure you discuss any questions you have with your health care provider. Document Released: 08/22/2011 Document Revised: 11/05/2015 Document Reviewed: 11/07/2013 Elsevier Interactive Patient Education  [employer].

## 2017-11-02 NOTE — Progress Notes (Addendum)
Name: Karen Davila   MRN: [id number]    DOB: [birth year]   Date:11/02/2017       Progress Note  Subjective  Chief Complaint  Chief Complaint  Patient presents with  . Follow-up    Bone Density    HPI  Patient c/o Right  shoulder pain put on some asper cream with moderate relief of pain. Has been hurting for about a week. Denies any falls or known injuries. States hurts more with movement.   Patient completed bone dexa scan on 5/21 showing osteoporosis and osteopenia. Patient states never been diagnosed before. Patient denies any broken bones. Patient states does take vitamin D and calcium supplements- states she take a bone pill. She does exercise- states walk around the block every day if the weather is good and inside the building if it is not, walks most of the day.    Patient Active Problem List   Diagnosis Date Noted  . Venous insufficiency 08/08/2017  . Lichen simplex chronicus 08/08/2017  . Swelling of limb 05/30/2017  . Left arm pain 08/30/2016  . Bilateral leg cramps 07/06/2016  . Chronic kidney disease, stage 3 (HCC) 07/07/2015  . Cardiac murmur, previously undiagnosed 04/06/2015  . Dementia 04/06/2015  . Difficulty hearing 04/06/2015  . OP (osteoporosis) 04/06/2015  . Hypertension 12/23/2014  . GERD (gastroesophageal reflux disease) 12/23/2014  . Adult hypothyroidism 12/23/2014  . Hyperlipidemia 12/23/2014  . Iron deficiency anemia 12/23/2014    Past Medical History:  Diagnosis Date  . Arthritis   . Chronic kidney disease   . GERD (gastroesophageal reflux disease)   . Hyperlipidemia   . Hypertension     Past Surgical History:  Procedure Laterality Date  . EYE SURGERY Right    Cataract    Social History   Tobacco Use  . Smoking status: Never Smoker  . Smokeless tobacco: Never Used  . Tobacco comment: smoking cessation materials not required  Substance Use Topics  . Alcohol use: No    Alcohol/week: 0.0 oz     Current Outpatient Medications:  .   aspirin EC 81 MG tablet, Take 81 mg by mouth daily., Disp: , Rfl:  .  atorvastatin (LIPITOR) 10 MG tablet, Take 1 tablet (10 mg total) by mouth daily., Disp: 90 tablet, Rfl: 0 .  Elastic Bandages & Supports (TRUFORM STOCKINGS 20-30MMHG) MISC, Wear stockings, put on in the morning and remove at night, Disp: 2 each, Rfl: 1 .  levothyroxine (SYNTHROID, LEVOTHROID) 50 MCG tablet, TAKE ONE TABLET BY MOUTH ONCE DAILY BEFORE BREAKFAST, Disp: 90 tablet, Rfl: 0 .  lisinopril (PRINIVIL,ZESTRIL) 20 MG tablet, Take 1 tablet (20 mg total) by mouth daily., Disp: 90 tablet, Rfl: 1 .  Olopatadine HCl 0.2 % SOLN, Apply 1 drop to eye every morning., Disp: 1 Bottle, Rfl: 0 .  ranitidine (ZANTAC) 150 MG tablet, Take 1 tablet (150 mg total) by mouth at bedtime. For stomach, Disp: 30 tablet, Rfl: 5 .  amLODipine (NORVASC) 5 MG tablet, Take 1.5 tablets (7.5 mg total) by mouth daily., Disp: 135 tablet, Rfl: 0 .  triamcinolone cream (KENALOG) 0.1 %, Apply 1 application topically 2 (two) times daily. (skin over the ankles and lower legs) (Patient not taking: Reported on 10/03/2017), Disp: 80 g, Rfl: 1  No Known Allergies  ROS .  No other specific complaints in a complete review of systems (except as listed in HPI above).  Objective  Vitals:   11/02/17 1014  BP: 122/64  Pulse: 79  Resp: 18  Temp: 98.6 F (37 C)  TempSrc: Oral  SpO2: 93%  Weight: 144 lb 8 oz (65.5 kg)  Height:  (1.6 m)    Body mass index is 25.6 kg/m.  Nursing Note and Vital Signs reviewed.  Physical Exam  Constitutional: Patient appears well-developed and well-nourished. No distress.  Cardiovascular: Normal rate, regular rhythm, S1/S2 present.  No murmur or rub heard.  Pulmonary/Chest: Effort normal and breath sounds clear. No respiratory distress or retractions. MSK: full movement of upper and lower extremities, no obvious deformity or redness or bruising in right shoulder.  Psychiatric: Patient has a normal mood and affect.  behavior is normal. Judgment and thought content normal.  No results found for this or any previous visit (from the past 72 hour(s)).  Assessment & Plan  1. Age-related osteoporosis without current pathological fracture - follow up with endo -discussed fall prevention -vitamin D, calcium supplementation   2. Acute pain of right shoulder - not present now and has full ROM,  - Ice/heat if not relieved can use lidocaine cream or tylenol     -Red flags and when to present for emergency care or RTC including fever >101.40F, chest pain, shortness of breath, new/worsening/un-resolving symptoms, falls reviewed with patient at time of visit. Follow up and care instructions discussed and provided in AVS.  -------------------------------------- I have reviewed this encounter including the documentation in this note and/or discussed this patient with the provider, Sharyon Cable DNP AGNP-C. I am certifying that I agree with the content of this note as supervising physician. Baruch Gouty, MD [facility] 11/02/2017, 5:43 PM

## 2017-11-08 ENCOUNTER — Other Ambulatory Visit: Payer: Self-pay | Admitting: Nurse Practitioner

## 2018-01-26 ENCOUNTER — Telehealth: Payer: Self-pay | Admitting: Family Medicine

## 2018-01-26 NOTE — Telephone Encounter (Signed)
I reviewed the note from Dr. Gabriel Carina Please contact her CMA Ask her to look at page 6 near the top She mentioned stopping her bisphosphonate if her renal function declines to eGFR I would like to clarify what number eGFR I'm sure it was just left out of the note Is it 60? 50? 30? etc Thank you

## 2018-01-29 NOTE — Telephone Encounter (Signed)
GFR 30 or less, She will usually change to prolia

## 2018-01-29 NOTE — Telephone Encounter (Signed)
Left message, they will call back

## 2018-02-22 ENCOUNTER — Other Ambulatory Visit: Payer: Self-pay | Admitting: Family Medicine

## 2018-03-21 ENCOUNTER — Other Ambulatory Visit: Payer: Self-pay

## 2018-03-21 DIAGNOSIS — I10 Essential (primary) hypertension: Secondary | ICD-10-CM

## 2018-03-21 MED ORDER — AMLODIPINE BESYLATE 5 MG PO TABS
7.5000 mg | ORAL_TABLET | Freq: Every day | ORAL | 0 refills | Status: DC
Start: 2018-03-21 — End: 2018-05-15

## 2018-03-26 ENCOUNTER — Other Ambulatory Visit: Payer: Self-pay

## 2018-03-26 DIAGNOSIS — I10 Essential (primary) hypertension: Secondary | ICD-10-CM

## 2018-03-26 NOTE — Telephone Encounter (Signed)
Last 11/02/17

## 2018-04-04 ENCOUNTER — Other Ambulatory Visit: Payer: Self-pay | Admitting: Family Medicine

## 2018-04-04 DIAGNOSIS — E038 Other specified hypothyroidism: Secondary | ICD-10-CM

## 2018-04-06 ENCOUNTER — Other Ambulatory Visit: Payer: Self-pay

## 2018-04-06 DIAGNOSIS — I10 Essential (primary) hypertension: Secondary | ICD-10-CM

## 2018-04-06 MED ORDER — LISINOPRIL 20 MG PO TABS: 20.0000 mg | ORAL_TABLET | Freq: Every day | ORAL | 0 refills | Status: DC

## 2018-04-06 NOTE — Telephone Encounter (Signed)
Lab Results  Component Value Date   TSH 2.93 08/08/2017

## 2018-05-03 ENCOUNTER — Ambulatory Visit (INDEPENDENT_AMBULATORY_CARE_PROVIDER_SITE_OTHER): Payer: Medicare HMO | Admitting: Nurse Practitioner

## 2018-05-03 ENCOUNTER — Encounter: Payer: Self-pay | Admitting: Nurse Practitioner

## 2018-05-03 ENCOUNTER — Ambulatory Visit
Admission: RE | Admit: 2018-05-03 | Discharge: 2018-05-03 | Disposition: A | Discharge status: Home / Self Care | Payer: Medicare HMO | Source: Ambulatory Visit | Attending: Nurse Practitioner | Admitting: Nurse Practitioner

## 2018-05-03 VITALS — BP 160/80 | HR 82 | Temp 97.8°F | Resp 16 | Ht 63.0 in | Wt 144.9 lb

## 2018-05-03 DIAGNOSIS — M25551 Pain in right hip: Secondary | ICD-10-CM | POA: Diagnosis not present

## 2018-05-03 DIAGNOSIS — M25552 Pain in left hip: Secondary | ICD-10-CM | POA: Insufficient documentation

## 2018-05-03 DIAGNOSIS — M79671 Pain in right foot: Secondary | ICD-10-CM

## 2018-05-03 DIAGNOSIS — I10 Essential (primary) hypertension: Secondary | ICD-10-CM | POA: Diagnosis not present

## 2018-05-03 NOTE — Patient Instructions (Signed)
-   Please take your blood pressure medication (amlodipine and lisinopril) as soon as you get home. Please check your blood pressure this evening If it is still over 150 on the top please call and tell us.  - Please go across the street and get imaging of your foot and hip.  - Continue taking acetaminophen for pain (no more than 3000mg  a day) this is over the counter - Use lidocaine cream on painful area

## 2018-05-03 NOTE — Progress Notes (Signed)
Name: Karen Davila   MRN: [id number]    DOB: 04/11/1918   Date:05/03/2018       Progress Note  Subjective  Chief Complaint  Chief Complaint  Patient presents with  . Leg Pain  . Foot Pain  . Joint Pain    HPI  Patient endorses right foot pain patient also endorses some times having aches in her hips states doesn't hurt all the time- states doesn't hurt real bad but feels its in her bones. No falls, no gait changes, no known injuries, no swelling.   Patient state didn't take her blood pressure medications this morning as she was trying to make appointment and skipped breakfast, no chest pain, headaches, or any pains right now.   Patient Active Problem List   Diagnosis Date Noted  . Venous insufficiency 08/08/2017  . Lichen simplex chronicus 08/08/2017  . Swelling of limb 05/30/2017  . Left arm pain 08/30/2016  . Bilateral leg cramps 07/06/2016  . Chronic kidney disease, stage 3 (HCC) 07/07/2015  . Cardiac murmur, previously undiagnosed 04/06/2015  . Dementia (HCC) 04/06/2015  . Difficulty hearing 04/06/2015  . OP (osteoporosis) 04/06/2015  . Hypertension 12/23/2014  . GERD (gastroesophageal reflux disease) 12/23/2014  . Adult hypothyroidism 12/23/2014  . Hyperlipidemia 12/23/2014  . Iron deficiency anemia 12/23/2014    Past Medical History:  Diagnosis Date  . Arthritis   . Chronic kidney disease   . GERD (gastroesophageal reflux disease)   . Hyperlipidemia   . Hypertension     Past Surgical History:  Procedure Laterality Date  . EYE SURGERY Right    Cataract    Social History   Tobacco Use  . Smoking status: Never Smoker  . Smokeless tobacco: Never Used  . Tobacco comment: smoking cessation materials not required  Substance Use Topics  . Alcohol use: No    Alcohol/week: 0.0 standard drinks     Current Outpatient Medications:  .  amLODipine (NORVASC) 5 MG tablet, Take 1.5 tablets (7.5 mg total) by mouth daily., Disp: 135 tablet, Rfl: 0 .  aspirin EC 81  MG tablet, Take 81 mg by mouth daily., Disp: , Rfl:  .  atorvastatin (LIPITOR) 10 MG tablet, Take 1 tablet (10 mg total) by mouth daily., Disp: 90 tablet, Rfl: 0 .  Elastic Bandages & Supports (TRUFORM STOCKINGS 20-30MMHG) MISC, Wear stockings, put on in the morning and remove at night, Disp: 2 each, Rfl: 1 .  levothyroxine (SYNTHROID, LEVOTHROID) 50 MCG tablet, TAKE 1 TABLET BY MOUTH ONCE DAILY BEFORE BREAKFAST, Disp: 90 tablet, Rfl: 1 .  Lidocaine 0.5 % GEL, Apply 1 application topically daily., Disp: 1 Tube, Rfl: 0 .  lisinopril (PRINIVIL,ZESTRIL) 20 MG tablet, Take 1 tablet (20 mg total) by mouth daily., Disp: 90 tablet, Rfl: 0 .  Olopatadine HCl 0.2 % SOLN, Apply 1 drop to eye every morning., Disp: 1 Bottle, Rfl: 0 .  ranitidine (ZANTAC) 150 MG tablet, TAKE 1 TABLET BY MOUTH AT BEDTIME FOR STOMACH, Disp: 90 tablet, Rfl: 1 .  triamcinolone cream (KENALOG) 0.1 %, Apply 1 application topically 2 (two) times daily. (skin over the ankles and lower legs), Disp: 80 g, Rfl: 1  No Known Allergies  ROS  No other specific complaints in a complete review of systems (except as listed in HPI above).  Objective  Vitals:   05/03/18 1104 05/03/18 1116  BP: (!) 180/80 (!) 160/80  Pulse: 82   Resp: 16   Temp: 97.8 F (36.6 C)   TempSrc: Oral  SpO2: 98%   Weight: 144 lb 14.4 oz (65.7 kg)   Height: 5\' 3"  (1.6 m)     Body mass index is 25.67 kg/m.  Nursing Note and Vital Signs reviewed.  Physical Exam  Constitutional: She is oriented to person, place, and time. She appears well-developed and well-nourished.  HENT:  Head: Normocephalic and atraumatic.  Cardiovascular: Normal rate and regular rhythm.  Pulmonary/Chest: Effort normal.  Musculoskeletal:       Right hip: She exhibits normal range of motion (rull ROM but pain in right hip with rotation), normal strength, no tenderness, no bony tenderness and no swelling.       Left hip: She exhibits normal range of motion (pain in right up with  flexion ), normal strength and no tenderness.       Feet:  Neurological: She is alert and oriented to person, place, and time.  Skin: Skin is warm and dry.  Psychiatric: She has a normal mood and affect.       No results found for this or any previous visit (from the past 48 hour(s)).  Assessment & Plan  1. Right foot pain Lidocaine cream; acetaminophen  - DG Foot Complete Right; Future  2. Bilateral hip pain acetaminophen  - DG HIP UNILAT WITH PELVIS 2-3 VIEWS RIGHT; Future - DG HIP UNILAT WITH PELVIS 2-3 VIEWS LEFT; Future  3. Essential hypertension Did not take meds, instructed to take medications when she is at home and recheck BP 1-2 hours after and call if over 150 systolic. Patient will follow up in one week.

## 2018-05-04 ENCOUNTER — Encounter: Payer: Self-pay | Admitting: Nurse Practitioner

## 2018-05-07 ENCOUNTER — Emergency Department
Admission: EM | Admit: 2018-05-07 | Discharge: 2018-05-07 | Disposition: A | Discharge status: Home / Self Care | Payer: Medicare HMO | Attending: Emergency Medicine | Admitting: Emergency Medicine

## 2018-05-07 ENCOUNTER — Emergency Department: Payer: Medicare HMO

## 2018-05-07 ENCOUNTER — Encounter: Payer: Self-pay | Admitting: Emergency Medicine

## 2018-05-07 ENCOUNTER — Other Ambulatory Visit: Payer: Self-pay

## 2018-05-07 DIAGNOSIS — I129 Hypertensive chronic kidney disease with stage 1 through stage 4 chronic kidney disease, or unspecified chronic kidney disease: Secondary | ICD-10-CM | POA: Insufficient documentation

## 2018-05-07 DIAGNOSIS — K5900 Constipation, unspecified: Secondary | ICD-10-CM | POA: Diagnosis not present

## 2018-05-07 DIAGNOSIS — N183 Chronic kidney disease, stage 3 (moderate): Secondary | ICD-10-CM | POA: Insufficient documentation

## 2018-05-07 DIAGNOSIS — F0390 Unspecified dementia without behavioral disturbance: Secondary | ICD-10-CM | POA: Diagnosis not present

## 2018-05-07 DIAGNOSIS — Z79899 Other long term (current) drug therapy: Secondary | ICD-10-CM | POA: Diagnosis not present

## 2018-05-07 DIAGNOSIS — Z7982 Long term (current) use of aspirin: Secondary | ICD-10-CM | POA: Diagnosis not present

## 2018-05-07 LAB — CBC
HCT: 31 % — ABNORMAL LOW (ref 36.0–46.0)
HEMOGLOBIN: 9.9 g/dL — AB (ref 12.0–15.0)
MCH: 26.2 pg (ref 26.0–34.0)
MCHC: 31.9 g/dL (ref 30.0–36.0)
MCV: 82 fL (ref 80.0–100.0)
Platelets: 216 10*3/uL (ref 150–400)
RBC: 3.78 MIL/uL — ABNORMAL LOW (ref 3.87–5.11)
RDW: 14.6 % (ref 11.5–15.5)
WBC: 5 10*3/uL (ref 4.0–10.5)
nRBC: 0 % (ref 0.0–0.2)

## 2018-05-07 LAB — COMPREHENSIVE METABOLIC PANEL
ALT: 14 U/L (ref 0–44)
AST: 27 U/L (ref 15–41)
Albumin: 4.3 g/dL (ref 3.5–5.0)
Alkaline Phosphatase: 51 U/L (ref 38–126)
Anion gap: 6 (ref 5–15)
BILIRUBIN TOTAL: 0.5 mg/dL (ref 0.3–1.2)
BUN: 16 mg/dL (ref 8–23)
CHLORIDE: 105 mmol/L (ref 98–111)
CO2: 29 mmol/L (ref 22–32)
CREATININE: 1.09 mg/dL — AB (ref 0.44–1.00)
Calcium: 9.8 mg/dL (ref 8.9–10.3)
GFR calc Af Amer: 47 mL/min — ABNORMAL LOW (ref >60)
GFR, EST NON AFRICAN AMERICAN: 41 mL/min — AB (ref >60)
Glucose, Bld: 107 mg/dL — ABNORMAL HIGH (ref 70–99)
Potassium: 3.5 mmol/L (ref 3.5–5.1)
Sodium: 140 mmol/L (ref 135–145)
TOTAL PROTEIN: 7.2 g/dL (ref 6.5–8.1)

## 2018-05-07 LAB — LIPASE, BLOOD: Lipase: 37 U/L (ref 11–51)

## 2018-05-07 MED ORDER — POLYETHYLENE GLYCOL 3350 17 G PO PACK: 17.0000 g | PACK | Freq: Every day | ORAL | 0 refills | Status: DC

## 2018-05-07 NOTE — ED Triage Notes (Signed)
Says tried laxative last night and has done a suippository and cannot move bowels.  Last bm 2 days ago after taking alaxative.

## 2018-05-07 NOTE — ED Provider Notes (Addendum)
[facility] Emergency Department Provider Note  ____________________________________________   I have reviewed the triage vital signs and the nursing notes. Where available I have reviewed prior notes and, if possible and indicated, outside hospital notes.    HISTORY  Chief Complaint Constipation    HPI Karen Davila is a 84 y.o. female 3 recurrent constipation she states she cannot have a bowel movement but she takes a laxative however, she took a laxative today and yesterday was unable to have a bowel movement.  She denies any significant abdominal pain fever chills.  She is been here for this before.  She states her last bowel movement was Saturday or Sunday, today is Monday.  She feels that she does have to have a bowel movement however.  She almost 7 earlier but it did not come out.  She is concerned about this.  She denies any vomiting, fever or any other concerning complaints.  She states she tried a home enema with minimal response.  She wants me to see if I can help her have a bowel movement.      Past Medical History:  Diagnosis Date  . Arthritis   . Chronic kidney disease   . GERD (gastroesophageal reflux disease)   . Hyperlipidemia   . Hypertension     Patient Active Problem List   Diagnosis Date Noted  . Venous insufficiency 08/08/2017  . Lichen simplex chronicus 08/08/2017  . Swelling of limb 05/30/2017  . Left arm pain 08/30/2016  . Bilateral leg cramps 07/06/2016  . Chronic kidney disease, stage 3 (HCC) 07/07/2015  . Cardiac murmur, previously undiagnosed 04/06/2015  . Dementia (HCC) 04/06/2015  . Difficulty hearing 04/06/2015  . OP (osteoporosis) 04/06/2015  . Hypertension 12/23/2014  . GERD (gastroesophageal reflux disease) 12/23/2014  . Adult hypothyroidism 12/23/2014  . Hyperlipidemia 12/23/2014  . Iron deficiency anemia 12/23/2014    Past Surgical History:  Procedure Laterality Date  . EYE SURGERY Right    Cataract     Prior to Admission medications   Medication Sig Start Date End Date Taking? Authorizing Provider  amLODipine (NORVASC) 5 MG tablet Take 1.5 tablets (7.5 mg total) by mouth daily. 03/21/18 06/19/18  Poulose, Percell BeltElizabeth E, NP  aspirin EC 81 MG tablet Take 81 mg by mouth daily.    [provider]  atorvastatin (LIPITOR) 10 MG tablet Take 1 tablet (10 mg total) by mouth daily. 10/18/17   Kerman PasseyLada, Melinda P, MD  Elastic Bandages & Supports (TRUFORM STOCKINGS 20-30MMHG) MISC Wear stockings, put on in the morning and remove at night 08/08/17   Lada, Janit BernMelinda P, MD  levothyroxine (SYNTHROID, LEVOTHROID) 50 MCG tablet TAKE 1 TABLET BY MOUTH ONCE DAILY BEFORE BREAKFAST 04/06/18   Lada, Janit BernMelinda P, MD  Lidocaine 0.5 % GEL Apply 1 application topically daily. 11/02/17   Poulose, Percell BeltElizabeth E, NP  lisinopril (PRINIVIL,ZESTRIL) 20 MG tablet Take 1 tablet (20 mg total) by mouth daily. 04/06/18   Kerman PasseyLada, Melinda P, MD  Olopatadine HCl 0.2 % SOLN Apply 1 drop to eye every morning. 05/24/16   Triplett, Rulon Eisenmengerari B, FNP  ranitidine (ZANTAC) 150 MG tablet TAKE 1 TABLET BY MOUTH AT BEDTIME FOR STOMACH 02/22/18   Lada, Janit BernMelinda P, MD  triamcinolone cream (KENALOG) 0.1 % Apply 1 application topically 2 (two) times daily. (skin over the ankles and lower legs) 08/08/17   Lada, Janit BernMelinda P, MD    Allergies Patient has no known allergies.  Family History  Problem Relation Age of Onset  .  Stroke Mother   . Healthy Father     Social History Social History   Tobacco Use  . Smoking status: Never Smoker  . Smokeless tobacco: Never Used  . Tobacco comment: smoking cessation materials not required  Substance Use Topics  . Alcohol use: No    Alcohol/week: 0.0 standard drinks  . Drug use: No    Review of Systems Constitutional: No fever/chills Eyes: No visual changes. ENT: No sore throat. No stiff neck no neck pain Cardiovascular: Denies chest pain. Respiratory: Denies shortness of breath. Gastrointestinal:   no  vomiting.  No diarrhea.   + constipation. Genitourinary: Negative for dysuria. Musculoskeletal: Negative lower extremity swelling Skin: Negative for rash. Neurological: Negative for severe headaches, focal weakness or numbness.   ____________________________________________   PHYSICAL EXAM:  VITAL SIGNS: ED Triage Vitals  Enc Vitals Group     BP 05/07/18 1252 (!) 168/72     Pulse Rate 05/07/18 1252 79     Resp 05/07/18 1252 18     Temp 05/07/18 1252 98.5 F (36.9 C)     Temp Source 05/07/18 1252 Oral     SpO2 05/07/18 1252 96 %     Weight 05/07/18 1252 140 lb (63.5 kg)     Height 05/07/18 1252 5\' 6"  (1.676 m)     Head Circumference --      Peak Flow --      Pain Score 05/07/18 1256 0     Pain Loc --      Pain Edu? --      Excl. in GC? --     Constitutional: Alert and oriented. Well appearing and in no acute distress. Eyes: Conjunctivae are normal Head: Atraumatic HEENT: No congestion/rhinnorhea. Mucous membranes are moist.  Oropharynx non-erythematous Neck:   Nontender with no meningismus, no masses, no stridor Cardiovascular: Normal rate, regular rhythm. Grossly normal heart sounds.  Good peripheral circulation. Respiratory: Normal respiratory effort.  No retractions. Lungs CTAB. Abdominal: Soft and nontender. No distention. No guarding no rebound Back:  There is no focal tenderness or step off.  there is no midline tenderness there are no lesions noted. there is no CVA tenderness Musculoskeletal: No lower extremity tenderness, no upper extremity tenderness. No joint effusions, no DVT signs strong distal pulses no edema Neurologic:  Normal speech and language. No gross focal neurologic deficits are appreciated.  Skin:  Skin is warm, dry and intact. No rash noted. Psychiatric: Mood and affect are normal. Speech and behavior are normal.  ____________________________________________   LABS (all labs ordered are listed, but only abnormal results are displayed)  Labs  Reviewed  COMPREHENSIVE METABOLIC PANEL - Abnormal; Notable for the following components:      Result Value   Glucose, Bld 107 (*)    Creatinine, Ser 1.09 (*)    GFR calc non Af Amer 41 (*)    GFR calc Af Amer 47 (*)    All other components within normal limits  CBC - Abnormal; Notable for the following components:   RBC 3.78 (*)    Hemoglobin 9.9 (*)    HCT 31.0 (*)    All other components within normal limits  LIPASE, BLOOD  URINALYSIS, COMPLETE (UACMP) WITH MICROSCOPIC    Pertinent labs  results that were available during my care of the patient were reviewed by me and considered in my medical decision making (see chart for details). ____________________________________________  EKG  I personally interpreted any EKGs ordered by me or triage  ____________________________________________  RADIOLOGY  Pertinent labs & imaging results that were available during my care of the patient were reviewed by me and considered in my medical decision making (see chart for details). If possible, patient and/or family made aware of any abnormal findings.  Dg Abdomen Acute W/chest  Result Date: 05/07/2018 CLINICAL DATA:  84 y/o F; 2 days of constipation and abdominal pain. EXAM: DG ABDOMEN ACUTE W/ 1V CHEST COMPARISON:  07/03/2015 abdomen radiographs FINDINGS: Mild cardiomegaly. Aortic atherosclerosis with calcification. Clear lungs. No pleural effusion or pneumothorax. No pneumoperitoneum. No appreciable urinary stone disease. Normal bowel gas pattern. Moderate lower lumbar levocurvature with apex at L3. Advanced degenerative changes at L3-S1. Mild osteoarthrosis of the hip joints with acetabular osteophytosis. No acute fracture identified. IMPRESSION: Normal bowel gas pattern.  No acute pulmonary process identified. Electronically Signed   By: Mitzi Hansen M.D.   On: 05/07/2018 16:18   ____________________________________________    PROCEDURES  Procedure(s) performed:  None  Procedures  Critical Care performed: None  ____________________________________________   INITIAL IMPRESSION / ASSESSMENT AND PLAN / ED COURSE  Pertinent labs & imaging results that were available during my care of the patient were reviewed by me and considered in my medical decision making (see chart for details).  Patient here complaining of obstipation, its been a couple days possibly since her last bowel movement.  This is upsetting to her.  Low suspicion for other pathologies, abdomen is benign, given her age I did do an acute abdomen which does not show any acute pathology either.  I will see if she requires a disimpaction, here for this before, I do not see any red flags to suggest AAA or other intra-abdominal pathology such as obstruction.  ----------------------------------------- 5:04 PM on 05/07/2018 -----------------------------------------  With female nurse present as chaperone, Cassie, I did attempt disimpaction on this patient.  There is minimal stool in the vault it is soft not impacted.  X-ray is normal.  Patient states that she just worries about her bowels and wants to make sure everything is okay.  Offered patient and family enema here but they very much prefer to go home.  I will try MiraLAX for her.  She is only taken 2 Dulcolax pills in the last 4 days and there is no evidence of overwhelming constipation.  No evidence of obstipation institutional intestines etc.  Considering the patient's symptoms, medical history, and physical examination today, I have low suspicion for cholecystitis or biliary pathology, pancreatitis, perforation or bowel obstruction, hernia, intra-abdominal abscess, AAA or dissection, volvulus or intussusception, mesenteric ischemia, ischemic gut, pyelonephritis or appendicitis.    ____________________________________________   FINAL CLINICAL IMPRESSION(S) / ED DIAGNOSES  Final diagnoses:  None      This chart was dictated using  voice recognition software.  Despite best efforts to proofread,  errors can occur which can change meaning.      Jeanmarie Plant, MD 05/07/18 1625    Jeanmarie Plant, MD 05/07/18 [phone]

## 2018-05-15 ENCOUNTER — Encounter: Payer: Self-pay | Admitting: Nurse Practitioner

## 2018-05-15 ENCOUNTER — Ambulatory Visit
Admission: RE | Admit: 2018-05-15 | Discharge: 2018-05-15 | Disposition: A | Discharge status: Home / Self Care | Payer: Medicare HMO | Source: Ambulatory Visit | Attending: Nurse Practitioner | Admitting: Nurse Practitioner

## 2018-05-15 ENCOUNTER — Ambulatory Visit (INDEPENDENT_AMBULATORY_CARE_PROVIDER_SITE_OTHER): Payer: Medicare HMO | Admitting: Nurse Practitioner

## 2018-05-15 ENCOUNTER — Ambulatory Visit: Payer: Self-pay

## 2018-05-15 VITALS — BP 180/80 | HR 85 | Temp 98.2°F | Resp 16 | Ht 63.0 in | Wt 144.5 lb

## 2018-05-15 DIAGNOSIS — K5900 Constipation, unspecified: Secondary | ICD-10-CM

## 2018-05-15 DIAGNOSIS — E7800 Pure hypercholesterolemia, unspecified: Secondary | ICD-10-CM

## 2018-05-15 DIAGNOSIS — I10 Essential (primary) hypertension: Secondary | ICD-10-CM

## 2018-05-15 MED ORDER — DOCUSATE SODIUM 100 MG PO CAPS: 100.0000 mg | ORAL_CAPSULE | Freq: Two times a day (BID) | ORAL | 2 refills | Status: DC

## 2018-05-15 MED ORDER — AMLODIPINE BESYLATE 5 MG PO TABS: 7.5000 mg | ORAL_TABLET | Freq: Every day | ORAL | 0 refills | Status: DC

## 2018-05-15 MED ORDER — ATORVASTATIN CALCIUM 10 MG PO TABS: 10.0000 mg | ORAL_TABLET | Freq: Every day | ORAL | 0 refills | Status: DC

## 2018-05-15 MED ORDER — BISACODYL 10 MG RE SUPP: 10.0000 mg | RECTAL | 0 refills | Status: DC | PRN

## 2018-05-15 MED ORDER — LISINOPRIL 20 MG PO TABS: 20.0000 mg | ORAL_TABLET | Freq: Every day | ORAL | 0 refills | Status: DC

## 2018-05-15 NOTE — Progress Notes (Signed)
Name: Karen Davila   MRN: [id number]    DOB: 07/28/1917   Date:05/15/2018       Progress Note  Subjective  Chief Complaint  Chief Complaint  Patient presents with  . Constipation    no bowel movement x 1 week    HPI  Patient presents for constipation, last BM 7 days ago. Drinks prune juice daily, tried milk of magnesia has tried fleets enema at home without relief. Drinks 3-4 water bottles a day. Was seen in ER for same on 11/25 states had good BM then and then next BM was small a few days later but not very good.  Denies abdominal pain, bloating, nausea. Notes scant blood with wiping.   States took one blood pressure medicine today but is not sure which one it was. States she is out of one of the medications but does not recall which one it is. No headaches, blurry vision, chest pain.   Patient Active Problem List   Diagnosis Date Noted  . Venous insufficiency 08/08/2017  . Lichen simplex chronicus 08/08/2017  . Swelling of limb 05/30/2017  . Left arm pain 08/30/2016  . Bilateral leg cramps 07/06/2016  . Chronic kidney disease, stage 3 (HCC) 07/07/2015  . Cardiac murmur, previously undiagnosed 04/06/2015  . Dementia (HCC) 04/06/2015  . Difficulty hearing 04/06/2015  . OP (osteoporosis) 04/06/2015  . Hypertension 12/23/2014  . GERD (gastroesophageal reflux disease) 12/23/2014  . Adult hypothyroidism 12/23/2014  . Hyperlipidemia 12/23/2014  . Iron deficiency anemia 12/23/2014    Past Medical History:  Diagnosis Date  . Arthritis   . Chronic kidney disease   . GERD (gastroesophageal reflux disease)   . Hyperlipidemia   . Hypertension     Past Surgical History:  Procedure Laterality Date  . EYE SURGERY Right    Cataract    Social History   Tobacco Use  . Smoking status: Never Smoker  . Smokeless tobacco: Never Used  . Tobacco comment: smoking cessation materials not required  Substance Use Topics  . Alcohol use: No    Alcohol/week: 0.0 standard drinks      Current Outpatient Medications:  .  amLODipine (NORVASC) 5 MG tablet, Take 1.5 tablets (7.5 mg total) by mouth daily., Disp: 135 tablet, Rfl: 0 .  aspirin EC 81 MG tablet, Take 81 mg by mouth daily., Disp: , Rfl:  .  atorvastatin (LIPITOR) 10 MG tablet, Take 1 tablet (10 mg total) by mouth daily., Disp: 90 tablet, Rfl: 0 .  Elastic Bandages & Supports (TRUFORM STOCKINGS 20-30MMHG) MISC, Wear stockings, put on in the morning and remove at night, Disp: 2 each, Rfl: 1 .  levothyroxine (SYNTHROID, LEVOTHROID) 50 MCG tablet, TAKE 1 TABLET BY MOUTH ONCE DAILY BEFORE BREAKFAST, Disp: 90 tablet, Rfl: 1 .  Lidocaine 0.5 % GEL, Apply 1 application topically daily., Disp: 1 Tube, Rfl: 0 .  lisinopril (PRINIVIL,ZESTRIL) 20 MG tablet, Take 1 tablet (20 mg total) by mouth daily., Disp: 90 tablet, Rfl: 0 .  Olopatadine HCl 0.2 % SOLN, Apply 1 drop to eye every morning., Disp: 1 Bottle, Rfl: 0 .  polyethylene glycol (MIRALAX) packet, Take 17 g by mouth daily., Disp: 14 each, Rfl: 0 .  ranitidine (ZANTAC) 150 MG tablet, TAKE 1 TABLET BY MOUTH AT BEDTIME FOR STOMACH, Disp: 90 tablet, Rfl: 1 .  triamcinolone cream (KENALOG) 0.1 %, Apply 1 application topically 2 (two) times daily. (skin over the ankles and lower legs), Disp: 80 g, Rfl: 1 .  bisacodyl (DULCOLAX) 10  MG suppository, Place 1 suppository (10 mg total) rectally as needed for moderate constipation or severe constipation., Disp: 10 suppository, Rfl: 0 .  docusate sodium (COLACE) 100 MG capsule, Take 1 capsule (100 mg total) by mouth 2 (two) times daily., Disp: 60 capsule, Rfl: 2  No Known Allergies  ROS   No other specific complaints in a complete review of systems (except as listed in HPI above).  Objective  Vitals:   05/15/18 1525 05/15/18 1527  BP: (!) 178/70 (!) 180/80  Pulse:  85  Resp:  16  Temp:  98.2 F (36.8 C)  TempSrc:  Oral  SpO2:  98%  Weight:  144 lb 8 oz (65.5 kg)  Height: 5\' 3"  (1.6 m) 5\' 3"  (1.6 m)    Body mass  index is 25.6 kg/m.  Nursing Note and Vital Signs reviewed.  Physical Exam  Constitutional: She is oriented to person, place, and time. She appears well-developed and well-nourished.  HENT:  Head: Normocephalic and atraumatic.  Neck: Normal range of motion. Neck supple. Carotid bruit is not present.  Cardiovascular: Normal heart sounds and intact distal pulses.  Pulmonary/Chest: Effort normal and breath sounds normal.  Abdominal: Soft. Bowel sounds are increased. There is no tenderness. There is no CVA tenderness.  Genitourinary: Rectal exam shows no fissure, no mass and no tenderness (scant liquid stool, no stool burden noted with digital rectal evacuation attempt).     Musculoskeletal: Normal range of motion.  Neurological: She is alert and oriented to person, place, and time. She has normal strength. No sensory deficit. GCS eye subscore is 4. GCS verbal subscore is 5. GCS motor subscore is 6.  Skin: Skin is warm, dry and intact. Capillary refill takes less than 2 seconds.  Psychiatric: She has a normal mood and affect. Her speech is normal and behavior is normal. Judgment and thought content normal.  Vitals reviewed.    No results found for this or any previous visit (from the past 48 hour(s)).  Assessment & Plan  1. Constipation, unspecified constipation type Will complete imaging if she does not have BM in 48 hours.  - bisacodyl (DULCOLAX) 10 MG suppository; Place 1 suppository (10 mg total) rectally as needed for moderate constipation or severe constipation.  Dispense: 10 suppository; Refill: 0 - DG Abd 2 Views; Future - docusate sodium (COLACE) 100 MG capsule; Take 1 capsule (100 mg total) by mouth 2 (two) times daily.  Dispense: 60 capsule; Refill: 2  2. Essential hypertension Elevated- discussed ER signs, will pick up and take medication she missed today; follow up in 2 days  - lisinopril (PRINIVIL,ZESTRIL) 20 MG tablet; Take 1 tablet (20 mg total) by mouth daily.   Dispense: 90 tablet; Refill: 0 - amLODipine (NORVASC) 5 MG tablet; Take 1.5 tablets (7.5 mg total) by mouth daily.  Dispense: 135 tablet; Refill: 0  3. Pure hypercholesterolemia - atorvastatin (LIPITOR) 10 MG tablet; Take 1 tablet (10 mg total) by mouth daily.  Dispense: 90 tablet; Refill: 0  Always:  - Increase water intake - Increase activity as tolerated.  - Metamucil Powder; 1 teaspon start once a day for 3 days then twice a day for 3 days then can go up to 3 times a day if needed.  - Take docusate sodium 100 mg twice a day  ________  Rarely just as needed; Please follow direction on box:   - Take Polyethylene glycol (miralax)17 grams per day today; if you do not have a bowel movement within 24 hours  please take one dulcolax suppository, if still not having bowel movement please return here or to ER.  - If unable to have Bowel movement, having abdominal pain, or nausea and vomiting please get medical assistance immediately.  _______ For blood pressure please ensure you are taking lisinopril 20mg  (one tablet) daily and amlodipine 7.5mg  (1 and a half tablet) daily. We will follow up with you in 3 days to ensure blood pressure is improved.   -Red flags and when to present for emergency care or RTC including fever >101.74F, abdominal pain, unable to have BM with discussed options,  new/worsening/un-resolving symptoms, reviewed with patient at time of visit. Follow up and care instructions discussed and provided in AVS.

## 2018-05-15 NOTE — Telephone Encounter (Signed)
Pt. called with c/o constipation.  Reported hasn't had BM in one week; her last BM was last Tuesday.  Reported she takes MOM every night, to try to have BM.  Reported using Fleets enema recently, and only passed fluid, but no stool.  Denied abdominal pain, bloating, nausea or vomiting, or rectal pain.  Stated she has noted intermittent streaks of blood on the toilet paper.  Stated she drinks 3-4 bottles H20 / day, juices, and hot tea.  Denied blood in toilet bowl.  Appt. sched. today with NP @ 4:00 PM.  Care advice given per protocol.  Pt. Verb. Understanding; agrees with plan.       Reason for Disposition . Last bowel movement (BM) > 4 days ago  Answer Assessment - Initial Assessment Questions 1. STOOL PATTERN OR FREQUENCY: "How often do you pass bowel movements (BMs)?"  (Normal range: tid to q 3 days)  "When was the last BM passed?"       BM's are not regular ; has to take laxatives to have BM 2. STRAINING: "Do you have to strain to have a BM?"      yes 3. RECTAL PAIN: "Does your rectum hurt when the stool comes out?" If so, ask: "Do you have hemorrhoids? How bad is the pain?"  (Scale 1-10; or mild, moderate, severe)     Denied  4. STOOL COMPOSITION: "Are the stools hard?"      Yes; passed watery stool yesterday   5. BLOOD ON STOOLS: "Has there been any blood on the toilet tissue or on the surface of the BM?" If so, ask: "When was the last time?"      Sees streaks of blood on toilet paper intermittently  6. CHRONIC CONSTIPATION: "Is this a new problem for you?"  If no, ask: "How long have you had this problem?" (days, weeks, months)     Ongoing  7. CHANGES IN DIET: "Have there been any recent changes in your diet?"      no 8. MEDICATIONS: "Have you been taking any new medications?"     No 9. LAXATIVES: "Have you been using any laxatives or enemas?"  If yes, ask "What, how often, and when was the last time?"     MOM every night, prune juice, Fleets enema 10. CAUSE: "What do you think is  causing the constipation?"        Is not sure 11. OTHER SYMPTOMS: "Do you have any other symptoms?" (e.g., abdominal pain, fever, vomiting)       Denied abdominal pain or bloating; denied nausea 12. PREGNANCY: "Is there any chance you are pregnant?" "When was your last menstrual period?"       N/a  Protocols used: CONSTIPATION-A-AH

## 2018-05-15 NOTE — Patient Instructions (Addendum)
Always:  - Increase water intake - Increase activity as tolerated.  - Metamucil Powder; 1 teaspon start once a day for 3 days then twice a day for 3 days then can go up to 3 times a day if needed.  - Take docusate sodium 100 mg twice a day  ________  Rarely just as needed; Please follow direction on box:   - Take Polyethylene glycol (miralax)17 grams per day today; if you do not have a bowel movement within 24 hours please take one dulcolax suppository, if still not having bowel movement please return here or to ER.  - If unable to have Bowel movement, having abdominal pain, or nausea and vomiting please get medical assistance immediately.  _______ For blood pressure please ensure you are taking lisinopril 20mg  (one tablet) daily and amlodipine 7.5mg  (1 and a half tablet) daily. We will follow up with you in 3 days to ensure blood pressure is improved.

## 2018-05-16 ENCOUNTER — Encounter: Payer: Self-pay | Admitting: Nurse Practitioner

## 2018-05-16 DIAGNOSIS — I700 Atherosclerosis of aorta: Secondary | ICD-10-CM | POA: Insufficient documentation

## 2018-05-18 ENCOUNTER — Ambulatory Visit (INDEPENDENT_AMBULATORY_CARE_PROVIDER_SITE_OTHER): Payer: Medicare HMO | Admitting: Nurse Practitioner

## 2018-05-18 ENCOUNTER — Encounter: Payer: Self-pay | Admitting: Nurse Practitioner

## 2018-05-18 VITALS — BP 140/62 | HR 80 | Temp 97.9°F | Resp 16 | Ht 63.0 in | Wt 142.8 lb

## 2018-05-18 DIAGNOSIS — K5909 Other constipation: Secondary | ICD-10-CM

## 2018-05-18 DIAGNOSIS — I10 Essential (primary) hypertension: Secondary | ICD-10-CM

## 2018-05-18 NOTE — Patient Instructions (Addendum)
-  I want you to check your blood pressures at home at least 3 times a week.  Write them down for us and tell us what they are at the end of the week. - can continue to take a fiber supplement like metamucil daily as instructed previously to prevent constipation.  - Follow up in one month to ensure blood pressure is well controlled, or sooner if needed.    Your goal blood pressure is less than 150 mmHg on top. Try to follow the DASH guidelines (DASH stands for Dietary Approaches to Stop Hypertension) Try to limit the sodium in your diet.  Ideally, consume less than 1.5 grams (less than 1,500mg ) per day. Do not add salt when cooking or at the table.  Check the sodium amount on labels when shopping, and choose items lower in sodium when given a choice. Avoid or limit foods that already contain a lot of sodium. Eat a diet rich in fruits and vegetables and whole grains.

## 2018-05-18 NOTE — Progress Notes (Signed)
Name: Karen Davila   MRN: [id number]    DOB: 05/09/1918   Date:05/18/2018       Progress Note  Subjective  Chief Complaint  Chief Complaint  Patient presents with  . Constipation    3 day follow up. She reports that she has had 3 bm since she was seen.     HPI  Constipation has resolved. Blood pressure initially elevated but came back down. States is feeling great now. Denies headaches, abdominal pain, chest pain or blurry vision.   BP Readings from Last 3 Encounters:  05/18/18 140/62  05/15/18 (!) 180/80  05/07/18 (!) 168/72     Patient Active Problem List   Diagnosis Date Noted  . Atherosclerosis of aorta (HCC) 05/16/2018  . Venous insufficiency 08/08/2017  . Lichen simplex chronicus 08/08/2017  . Swelling of limb 05/30/2017  . Left arm pain 08/30/2016  . Bilateral leg cramps 07/06/2016  . Chronic kidney disease, stage 3 (HCC) 07/07/2015  . Cardiac murmur, previously undiagnosed 04/06/2015  . Dementia (HCC) 04/06/2015  . Difficulty hearing 04/06/2015  . OP (osteoporosis) 04/06/2015  . Hypertension 12/23/2014  . GERD (gastroesophageal reflux disease) 12/23/2014  . Adult hypothyroidism 12/23/2014  . Hyperlipidemia 12/23/2014  . Iron deficiency anemia 12/23/2014    Past Medical History:  Diagnosis Date  . Arthritis   . Chronic kidney disease   . GERD (gastroesophageal reflux disease)   . Hyperlipidemia   . Hypertension     Past Surgical History:  Procedure Laterality Date  . EYE SURGERY Right    Cataract    Social History   Tobacco Use  . Smoking status: Never Smoker  . Smokeless tobacco: Never Used  . Tobacco comment: smoking cessation materials not required  Substance Use Topics  . Alcohol use: No    Alcohol/week: 0.0 standard drinks     Current Outpatient Medications:  .  amLODipine (NORVASC) 5 MG tablet, Take 1.5 tablets (7.5 mg total) by mouth daily., Disp: 135 tablet, Rfl: 0 .  aspirin EC 81 MG tablet, Take 81 mg by mouth daily., Disp: ,  Rfl:  .  atorvastatin (LIPITOR) 10 MG tablet, Take 1 tablet (10 mg total) by mouth daily., Disp: 90 tablet, Rfl: 0 .  bisacodyl (DULCOLAX) 10 MG suppository, Place 1 suppository (10 mg total) rectally as needed for moderate constipation or severe constipation., Disp: 10 suppository, Rfl: 0 .  docusate sodium (COLACE) 100 MG capsule, Take 1 capsule (100 mg total) by mouth 2 (two) times daily., Disp: 60 capsule, Rfl: 2 .  Elastic Bandages & Supports (TRUFORM STOCKINGS 20-30MMHG) MISC, Wear stockings, put on in the morning and remove at night, Disp: 2 each, Rfl: 1 .  levothyroxine (SYNTHROID, LEVOTHROID) 50 MCG tablet, TAKE 1 TABLET BY MOUTH ONCE DAILY BEFORE BREAKFAST, Disp: 90 tablet, Rfl: 1 .  Lidocaine 0.5 % GEL, Apply 1 application topically daily., Disp: 1 Tube, Rfl: 0 .  lisinopril (PRINIVIL,ZESTRIL) 20 MG tablet, Take 1 tablet (20 mg total) by mouth daily., Disp: 90 tablet, Rfl: 0 .  Olopatadine HCl 0.2 % SOLN, Apply 1 drop to eye every morning., Disp: 1 Bottle, Rfl: 0 .  polyethylene glycol (MIRALAX) packet, Take 17 g by mouth daily., Disp: 14 each, Rfl: 0 .  ranitidine (ZANTAC) 150 MG tablet, TAKE 1 TABLET BY MOUTH AT BEDTIME FOR STOMACH, Disp: 90 tablet, Rfl: 1 .  triamcinolone cream (KENALOG) 0.1 %, Apply 1 application topically 2 (two) times daily. (skin over the ankles and lower legs), Disp: 80 g,  Rfl: 1  No Known Allergies  ROS   No other specific complaints in a complete review of systems (except as listed in HPI above).  Objective  Vitals:   05/18/18 1036 05/18/18 1049  BP: (!) 160/60 140/62  Pulse: 80   Resp: 16   Temp: 97.9 F (36.6 C)   TempSrc: Oral   SpO2: 96%   Weight: 142 lb 12.8 oz (64.8 kg)   Height: 5\' 3"  (1.6 m)     Body mass index is 25.3 kg/m.  Nursing Note and Vital Signs reviewed.  Physical Exam  Constitutional: She is oriented to person, place, and time. She appears well-developed and well-nourished.  HENT:  Head: Normocephalic and atraumatic.   Cardiovascular: Normal heart sounds and intact distal pulses.  Pulmonary/Chest: Effort normal and breath sounds normal.  Abdominal: Soft. Bowel sounds are normal. There is no tenderness.  Musculoskeletal: Normal range of motion.  Neurological: She is alert and oriented to person, place, and time.  Skin: Skin is warm and dry.  Psychiatric: She has a normal mood and affect. Her behavior is normal.  Nursing note and vitals reviewed.    No results found for this or any previous visit (from the past 48 hour(s)).  Assessment & Plan  1. Essential hypertension -I want you to check your blood pressures at home at least 3 times a week.  Write them down for [place] and tell us what they are at the end of the week. If elevated over 170 on the top or consistently over 150 please call and let us know  - Continue current medications - One month follow-up  2. Other constipation resolved

## 2018-05-19 ENCOUNTER — Encounter: Payer: Self-pay | Admitting: Emergency Medicine

## 2018-05-19 ENCOUNTER — Emergency Department: Payer: Medicare HMO

## 2018-05-19 ENCOUNTER — Emergency Department
Admission: EM | Admit: 2018-05-19 | Discharge: 2018-05-19 | Discharge status: Against Medical Advice | Payer: Medicare HMO | Attending: Emergency Medicine | Admitting: Emergency Medicine

## 2018-05-19 DIAGNOSIS — R109 Unspecified abdominal pain: Secondary | ICD-10-CM | POA: Insufficient documentation

## 2018-05-19 DIAGNOSIS — Z5321 Procedure and treatment not carried out due to patient leaving prior to being seen by health care provider: Secondary | ICD-10-CM | POA: Diagnosis not present

## 2018-05-19 LAB — CBC
HCT: 32 % — ABNORMAL LOW (ref 36.0–46.0)
Hemoglobin: 10 g/dL — ABNORMAL LOW (ref 12.0–15.0)
MCH: 26 pg (ref 26.0–34.0)
MCHC: 31.3 g/dL (ref 30.0–36.0)
MCV: 83.1 fL (ref 80.0–100.0)
PLATELETS: 260 10*3/uL (ref 150–400)
RBC: 3.85 MIL/uL — ABNORMAL LOW (ref 3.87–5.11)
RDW: 14.6 % (ref 11.5–15.5)
WBC: 7.3 10*3/uL (ref 4.0–10.5)
nRBC: 0 % (ref 0.0–0.2)

## 2018-05-19 LAB — COMPREHENSIVE METABOLIC PANEL
ALBUMIN: 4.2 g/dL (ref 3.5–5.0)
ALK PHOS: 52 U/L (ref 38–126)
ALT: 14 U/L (ref 0–44)
AST: 28 U/L (ref 15–41)
Anion gap: 9 (ref 5–15)
BILIRUBIN TOTAL: 0.4 mg/dL (ref 0.3–1.2)
BUN: 12 mg/dL (ref 8–23)
CALCIUM: 9.8 mg/dL (ref 8.9–10.3)
CO2: 28 mmol/L (ref 22–32)
Chloride: 101 mmol/L (ref 98–111)
Creatinine, Ser: 1.2 mg/dL — ABNORMAL HIGH (ref 0.44–1.00)
GFR calc Af Amer: 44 mL/min — ABNORMAL LOW (ref >60)
GFR, EST NON AFRICAN AMERICAN: 38 mL/min — AB (ref >60)
GLUCOSE: 100 mg/dL — AB (ref 70–99)
Potassium: 3.9 mmol/L (ref 3.5–5.1)
Sodium: 138 mmol/L (ref 135–145)
TOTAL PROTEIN: 7.4 g/dL (ref 6.5–8.1)

## 2018-05-19 LAB — LIPASE, BLOOD: Lipase: 44 U/L (ref 11–51)

## 2018-05-19 MED ORDER — ONDANSETRON HCL 4 MG/2ML IJ SOLN
4.0000 mg | Freq: Once | INTRAMUSCULAR | Status: AC | PRN
  Administered 2018-05-19: 4 mg via INTRAVENOUS
  Filled 2018-05-19: qty 2

## 2018-05-19 NOTE — ED Notes (Signed)
Pt passing gas in triage. Per pt, "I feel like a brand new person. I feel so much better."

## 2018-05-19 NOTE — ED Notes (Signed)
Patient states that she had a BM and that she feels better and that she would like to go home. Patient notified that he results from her CT are not back and encouraged to stay. Patient states that she is going to stay at this time.

## 2018-05-19 NOTE — ED Triage Notes (Signed)
Patient states that she developed generalized abdominal pain tonight after super. Patient states that she feels like she needs to have a BM but unable to. Patient states that she had a BM yesterday. Patient states that she has a history of bowel obstruction.

## 2018-05-19 NOTE — ED Notes (Signed)
Patient states that she is still feeling better and reports that she has a large BM and that she wants to go home. Patient encouraged to stay to wait for ct results. Patient states that she does not want to wait.

## 2018-06-15 ENCOUNTER — Ambulatory Visit (INDEPENDENT_AMBULATORY_CARE_PROVIDER_SITE_OTHER): Payer: Medicare HMO | Admitting: Family Medicine

## 2018-06-15 ENCOUNTER — Encounter: Payer: Self-pay | Admitting: Family Medicine

## 2018-06-15 DIAGNOSIS — N183 Chronic kidney disease, stage 3 unspecified: Secondary | ICD-10-CM

## 2018-06-15 DIAGNOSIS — I700 Atherosclerosis of aorta: Secondary | ICD-10-CM | POA: Diagnosis not present

## 2018-06-15 DIAGNOSIS — D649 Anemia, unspecified: Secondary | ICD-10-CM

## 2018-06-15 DIAGNOSIS — I10 Essential (primary) hypertension: Secondary | ICD-10-CM | POA: Diagnosis not present

## 2018-06-15 DIAGNOSIS — E039 Hypothyroidism, unspecified: Secondary | ICD-10-CM

## 2018-06-15 DIAGNOSIS — M810 Age-related osteoporosis without current pathological fracture: Secondary | ICD-10-CM

## 2018-06-15 DIAGNOSIS — E785 Hyperlipidemia, unspecified: Secondary | ICD-10-CM

## 2018-06-15 NOTE — Assessment & Plan Note (Signed)
Check TSH 

## 2018-06-15 NOTE — Assessment & Plan Note (Signed)
Multiple readings today, elevated first sound up high enough to catch Korotkov sounds; continue medicine

## 2018-06-15 NOTE — Assessment & Plan Note (Signed)
Fall prevention; continue medicine; check Vit D

## 2018-06-15 NOTE — Assessment & Plan Note (Signed)
Check lipids; continue statin 

## 2018-06-15 NOTE — Patient Instructions (Signed)
Try to follow the DASH guidelines (DASH stands for Dietary Approaches to Stop Hypertension). Try to limit the sodium in your diet to no more than 1,500mg  of sodium per day. Certainly try to not exceed 2,000 mg per day at the very most. Do not add salt when cooking or at the table.  Check the sodium amount on labels when shopping, and choose items lower in sodium when given a choice. Avoid or limit foods that already contain a lot of sodium. Eat a diet rich in fruits and vegetables and whole grains, and try to lose weight if overweight or obese Well get labs today If you have not heard anything from my staff in a week about any orders/referrals/studies from today, please contact us here to follow-up [phone]

## 2018-06-15 NOTE — Assessment & Plan Note (Signed)
Likely chronic disease; check ferritin and retic count; no blood in the stool

## 2018-06-15 NOTE — Assessment & Plan Note (Signed)
On statin; at advanced age, will not be more aggressive

## 2018-06-15 NOTE — Progress Notes (Signed)
BP (!) 122/58 (BP Location: Right Arm, Cuff Size: Large)   Pulse 76   Temp 98.1 F (36.7 C)   Ht 5\' 3"  (1.6 m)   Wt 138 lb 14.4 oz (63 kg)   SpO2 96%   BMI 24.61 kg/m     Subjective:    Patient ID: Karen Davila, female    DOB: [birth year], 85 y.o.   MRN: [id number]  HPI: Karen Davila is a 85 y.o. female  Chief Complaint  Patient presents with  . Follow-up    HPI Patient is here for f/u She saw Sharyon Cable, DNP one month ago for HTN and constipation; BP was 140/62 on December 6th, but it was 180/80 on December 3rd She has a friend checking BP in the biceps area and 171/71 on Dec 30, 172/63 on Dec 31, and 162/66 on Jan 2nd  She was in the ER for constipation on Nov 25th and again on Dec 7th for constipation, abdominal pain; she had a large BM at the ER and felt better and went home; had CT scan Davila, did not wait for results; taking miralax every day; had BM yesterday; no blood in the stools; no abdominal pain; plenty of water and fiber  CLINICAL DATA: Patient states that she developed generalized  abdominal pain tonight after super. Patient states that she feels  like she needs to have a BM but unable to. Patient states that she  had a BM yesterday.Abd pain, acute, generalized  EXAM:  CT ABDOMEN AND PELVIS WITHOUT CONTRAST  TECHNIQUE:  Multidetector CT imaging of the abdomen and pelvis was performed  following the standard protocol without IV contrast.  COMPARISON: None.  FINDINGS:  Lower chest: Lung bases are clear.  Hepatobiliary: No focal hepatic lesion. No biliary duct dilatation.  Gallbladder is normal. Common bile duct is normal.  Pancreas: Pancreas is normal. No ductal dilatation. No pancreatic  inflammation.  Spleen: Normal spleen  Adrenals/urinary tract: Simple fluid attenuation cyst of the LEFT  kidney. Adrenal glands normal. No obstructive uropathy. Ureters  bladder normal.  Stomach/Bowel: Stomach, small-bowel, cecum normal. Appendix not    identified. No pericecal inflammation. The colon and rectosigmoid  colon are normal.  Vascular/Lymphatic: Abdominal aorta is normal caliber with  atherosclerotic calcification. There is no retroperitoneal or  periportal lymphadenopathy. No pelvic lymphadenopathy.  Reproductive: Uterus and ovaries normal.  Other: No free fluid.  Musculoskeletal: No aggressive osseous lesion. Endplate compression  deformity at L4.  IMPRESSION:  1. No acute abdominopelvic findings.  2. Aortic Atherosclerosis (ICD10-I70.0).  Electronically Signed  By: Genevive Bi M.D.  On: 05/19/2018 02:32  Hypothyroidism; some constipation; energy is okay for her age she says; no hair loss  CKD stage 3; no NSAIDs; good water drinker  Anemia; eating turnip greens and mustard greens; reviewed last 4 CBC reviewed; reviewed iron studies from Feb 2019  High cholesterol and atherosclerosis of the aorta; eats sausage every now and then, not even every week; likes cheese and had some this morning  Osteoporosis; had DEXA scan in May; taking bone medicine; using cane to avoid falls; no stairs  Depression screen [facility] 2/9 06/15/2018 10/03/2017 08/08/2017 06/08/2017 08/22/2016  Decreased Interest 0 0 0 0 0  Down, Depressed, Hopeless 0 0 0 0 0  PHQ - 2 Score 0 0 0 0 0  Altered sleeping 0 0 - - -  Tired, decreased energy 0 0 - - -  Change in appetite 0 0 - - -  Feeling bad or  failure about yourself  0 0 - - -  Trouble concentrating 0 0 - - -  Moving slowly or fidgety/restless 0 0 - - -  Suicidal thoughts 0 0 - - -  PHQ-9 Score 0 0 - - -  Difficult doing work/chores Not difficult at all Not difficult at all - - -   Fall Risk  06/15/2018 05/18/2018 05/03/2018 05/03/2018 10/03/2017  Falls in the past year? 0 0 0 0 No  Risk for fall due to : - - - - -    Relevant past medical, surgical, family and social history reviewed Past Medical History:  Diagnosis Date  . Arthritis   . Chronic kidney disease   . GERD (gastroesophageal  reflux disease)   . Hyperlipidemia   . Hypertension    Past Surgical History:  Procedure Laterality Date  . EYE SURGERY Right    Cataract   Family History  Problem Relation Age of Onset  . Stroke Mother   . Healthy Father    Social History   Tobacco Use  . Smoking status: Never Smoker  . Smokeless tobacco: Never Used  . Tobacco comment: smoking cessation materials not required  Substance Use Topics  . Alcohol use: No    Alcohol/week: 0.0 standard drinks  . Drug use: No     Office Visit from 06/15/2018 in [facility]  AUDIT-C Score  0      Interim medical history since last visit reviewed. Allergies and medications reviewed  Review of Systems Per HPI unless specifically indicated above     Objective:    BP (!) 122/58 (BP Location: Right Arm, Cuff Size: Large)   Pulse 76   Temp 98.1 F (36.7 C)   Ht 5\' 3"  (1.6 m)   Wt 138 lb 14.4 oz (63 kg)   SpO2 96%   BMI 24.61 kg/m   Wt Readings from Last 3 Encounters:  06/15/18 138 lb 14.4 oz (63 kg)  05/19/18 142 lb 12.8 oz (64.8 kg)  05/18/18 142 lb 12.8 oz (64.8 kg)    Physical Exam Constitutional:      General: She is not in acute distress.    Appearance: She is well-developed. She is not diaphoretic.  HENT:     Head: Normocephalic and atraumatic.  Eyes:     General: No scleral icterus. Neck:     Thyroid: No thyromegaly.  Cardiovascular:     Rate and Rhythm: Normal rate and regular rhythm.     Heart sounds: Normal heart sounds. No murmur.  Pulmonary:     Effort: Pulmonary effort is normal. No respiratory distress.     Breath sounds: Normal breath sounds. No wheezing.  Abdominal:     General: Bowel sounds are normal. There is no distension.     Palpations: Abdomen is soft.  Skin:    General: Skin is warm and dry.     Coloration: Skin is not pale.  Neurological:     Mental Status: She is alert.  Psychiatric:        Behavior: Behavior normal.        Thought Content: Thought content  normal.        Judgment: Judgment normal.        Assessment & Plan:   Problem List Items Addressed This Visit      Cardiovascular and Mediastinum   Hypertension    Multiple readings today, elevated first sound up high enough to catch Korotkov sounds; continue medicine  Atherosclerosis of aorta (HCC)    On statin; at advanced age, will not be more aggressive      Relevant Orders   Lipid panel (Completed)     Endocrine   Adult hypothyroidism    Check TSH      Relevant Orders   TSH (Completed)     Musculoskeletal and Integument   OP (osteoporosis)    Fall prevention; continue medicine; check Vit D      Relevant Orders   VITAMIN D 25 Hydroxy (Vit-D Deficiency, Fractures) (Completed)     Genitourinary   Chronic kidney disease, stage 3 (HCC)    Avoid NSAIDs; stay well-hydrated        Other   Hyperlipidemia    Check lipids; continue statin      Relevant Orders   Lipid panel (Completed)   Anemia    Likely chronic disease; check ferritin and retic count; no blood in the stool      Relevant Orders   Iron, TIBC and Ferritin Panel (Completed)   Retic (Completed)   Pathologist smear review (Completed)       Follow up plan: Return in about 6 months (around 12/14/2018) for follow-up visit with Dr. Sherie DonLada; [facility]  An after-visit summary was printed and given to the patient at check-out.  Please see the patient instructions which may contain other information and recommendations beyond what is mentioned above in the assessment and plan.  No orders of the defined types were placed in this encounter.   Orders Placed This Encounter  Procedures  . Iron, TIBC and Ferritin Panel  . Retic  . Pathologist smear review  . TSH  . VITAMIN D 25 Hydroxy (Vit-D Deficiency, Fractures)  . Lipid panel

## 2018-06-15 NOTE — Assessment & Plan Note (Signed)
Avoid NSAIDs; stay well-hydrated 

## 2018-06-18 LAB — LIPID PANEL
Cholesterol: 173 mg/dL (ref <200)
HDL: 54 mg/dL (ref >50)
LDL CHOLESTEROL (CALC): 95 mg/dL
NON-HDL CHOLESTEROL (CALC): 119 mg/dL (ref <130)
TRIGLYCERIDES: 140 mg/dL (ref <150)
Total CHOL/HDL Ratio: 3.2 (calc) (ref <5.0)

## 2018-06-18 LAB — IRON,TIBC AND FERRITIN PANEL
%SAT: 24 % (calc) (ref 16–45)
Ferritin: 224 ng/mL (ref 16–288)
IRON: 68 ug/dL (ref 45–160)
TIBC: 280 mcg/dL (calc) (ref 250–450)

## 2018-06-18 LAB — PATHOLOGIST SMEAR REVIEW

## 2018-06-18 LAB — TSH: TSH: 2.28 m[IU]/L (ref 0.40–4.50)

## 2018-06-18 LAB — RETICULOCYTES
ABS RETIC: 43440 {cells}/uL (ref 20000–80000)
Retic Ct Pct: 1.2 %

## 2018-06-18 LAB — VITAMIN D 25 HYDROXY (VIT D DEFICIENCY, FRACTURES): VIT D 25 HYDROXY: 44 ng/mL (ref 30–100)

## 2018-07-03 ENCOUNTER — Encounter: Payer: Self-pay | Admitting: Emergency Medicine

## 2018-07-03 ENCOUNTER — Emergency Department: Payer: Medicare HMO

## 2018-07-03 ENCOUNTER — Other Ambulatory Visit: Payer: Self-pay

## 2018-07-03 ENCOUNTER — Emergency Department
Admission: EM | Admit: 2018-07-03 | Discharge: 2018-07-03 | Disposition: A | Discharge status: Home / Self Care | Payer: Medicare HMO | Attending: Emergency Medicine | Admitting: Emergency Medicine

## 2018-07-03 DIAGNOSIS — F0390 Unspecified dementia without behavioral disturbance: Secondary | ICD-10-CM | POA: Insufficient documentation

## 2018-07-03 DIAGNOSIS — E039 Hypothyroidism, unspecified: Secondary | ICD-10-CM | POA: Insufficient documentation

## 2018-07-03 DIAGNOSIS — Z79899 Other long term (current) drug therapy: Secondary | ICD-10-CM | POA: Insufficient documentation

## 2018-07-03 DIAGNOSIS — S60921A Unspecified superficial injury of right hand, initial encounter: Secondary | ICD-10-CM | POA: Diagnosis present

## 2018-07-03 DIAGNOSIS — Y999 Unspecified external cause status: Secondary | ICD-10-CM | POA: Diagnosis not present

## 2018-07-03 DIAGNOSIS — I129 Hypertensive chronic kidney disease with stage 1 through stage 4 chronic kidney disease, or unspecified chronic kidney disease: Secondary | ICD-10-CM | POA: Insufficient documentation

## 2018-07-03 DIAGNOSIS — W01198A Fall on same level from slipping, tripping and stumbling with subsequent striking against other object, initial encounter: Secondary | ICD-10-CM | POA: Insufficient documentation

## 2018-07-03 DIAGNOSIS — S62336A Displaced fracture of neck of fifth metacarpal bone, right hand, initial encounter for closed fracture: Secondary | ICD-10-CM | POA: Diagnosis not present

## 2018-07-03 DIAGNOSIS — Y929 Unspecified place or not applicable: Secondary | ICD-10-CM | POA: Insufficient documentation

## 2018-07-03 DIAGNOSIS — Y939 Activity, unspecified: Secondary | ICD-10-CM | POA: Insufficient documentation

## 2018-07-03 DIAGNOSIS — N183 Chronic kidney disease, stage 3 (moderate): Secondary | ICD-10-CM | POA: Diagnosis not present

## 2018-07-03 DIAGNOSIS — Z7982 Long term (current) use of aspirin: Secondary | ICD-10-CM | POA: Diagnosis not present

## 2018-07-03 MED ORDER — IBUPROFEN 400 MG PO TABS
400.0000 mg | ORAL_TABLET | Freq: Once | ORAL | Status: AC
  Administered 2018-07-03: 400 mg via ORAL
  Filled 2018-07-03: qty 1

## 2018-07-03 NOTE — ED Notes (Signed)
Xray at the bedside.

## 2018-07-03 NOTE — ED Triage Notes (Signed)
FIRST NURSE NOTE-pt tripped and fell.  Alert and NAD.

## 2018-07-03 NOTE — ED Provider Notes (Signed)
[facility] Emergency Department Provider Note   First MD Initiated Contact with Patient 07/03/18 [phone]     (approximate)  I have reviewed the triage vital signs and the nursing notes.   HISTORY  Chief Complaint Fall    HPI Karen Davila is a 85 y.o. female of chronic medical conditions presents to the emergency department following accidental trip and fall on Saturday with resultant right hand injury.  Patient states that she also struck her forehead however did not lose consciousness.  Patient's pain is increased in her right hand since the injury.  Pain is worse with movement of the hand.   Past Medical History:  Diagnosis Date  . Arthritis   . Chronic kidney disease   . GERD (gastroesophageal reflux disease)   . Hyperlipidemia   . Hypertension     Patient Active Problem List   Diagnosis Date Noted  . Anemia 06/15/2018  . Atherosclerosis of aorta (HCC) 05/16/2018  . Venous insufficiency 08/08/2017  . Lichen simplex chronicus 08/08/2017  . Swelling of limb 05/30/2017  . Left arm pain 08/30/2016  . Bilateral leg cramps 07/06/2016  . Chronic kidney disease, stage 3 (HCC) 07/07/2015  . Cardiac murmur, previously undiagnosed 04/06/2015  . Dementia (HCC) 04/06/2015  . Difficulty hearing 04/06/2015  . OP (osteoporosis) 04/06/2015  . Hypertension 12/23/2014  . GERD (gastroesophageal reflux disease) 12/23/2014  . Adult hypothyroidism 12/23/2014  . Hyperlipidemia 12/23/2014  . Iron deficiency anemia 12/23/2014    Past Surgical History:  Procedure Laterality Date  . EYE SURGERY Right    Cataract    Prior to Admission medications   Medication Sig Start Date End Date Taking? Authorizing Provider  amLODipine (NORVASC) 5 MG tablet Take 1.5 tablets (7.5 mg total) by mouth daily. 05/15/18 08/13/18  PoulosePercell Belt, NP  aspirin EC 81 MG tablet Take 81 mg by mouth daily.    [provider]  atorvastatin (LIPITOR) 10 MG tablet Take 1  tablet (10 mg total) by mouth daily. 05/15/18   Poulose, Percell Belt, NP  bisacodyl (DULCOLAX) 10 MG suppository Place 1 suppository (10 mg total) rectally as needed for moderate constipation or severe constipation. 05/15/18   Poulose, Percell Belt, NP  docusate sodium (COLACE) 100 MG capsule Take 1 capsule (100 mg total) by mouth 2 (two) times daily. 05/15/18   Poulose, Percell Belt, NP  Elastic Bandages & Supports (TRUFORM STOCKINGS 20-30MMHG) MISC Wear stockings, put on in the morning and remove at night 08/08/17   Lada, Janit Bern, MD  levothyroxine (SYNTHROID, LEVOTHROID) 50 MCG tablet TAKE 1 TABLET BY MOUTH ONCE DAILY BEFORE BREAKFAST 04/06/18   Lada, Janit Bern, MD  Lidocaine 0.5 % GEL Apply 1 application topically daily. 11/02/17   Poulose, Percell Belt, NP  lisinopril (PRINIVIL,ZESTRIL) 20 MG tablet Take 1 tablet (20 mg total) by mouth daily. 05/15/18   Poulose, Percell Belt, NP  Olopatadine HCl 0.2 % SOLN Apply 1 drop to eye every morning. 05/24/16   Triplett, Rulon Eisenmenger B, FNP  polyethylene glycol (MIRALAX) packet Take 17 g by mouth daily. 05/07/18   Jeanmarie Plant, MD  ranitidine (ZANTAC) 150 MG tablet TAKE 1 TABLET BY MOUTH AT BEDTIME FOR STOMACH 02/22/18   Lada, Janit Bern, MD  triamcinolone cream (KENALOG) 0.1 % Apply 1 application topically 2 (two) times daily. (skin over the ankles and lower legs) 08/08/17   Lada, Janit Bern, MD    Allergies Patient has no known allergies.  Family History  Problem Relation Age  of Onset  . Stroke Mother   . Healthy Father     Social History Social History   Tobacco Use  . Smoking status: Never Smoker  . Smokeless tobacco: Never Used  . Tobacco comment: smoking cessation materials not required  Substance Use Topics  . Alcohol use: No    Alcohol/week: 0.0 standard drinks  . Drug use: No    Review of Systems Constitutional: No fever/chills Eyes: No visual changes. ENT: No sore throat. Cardiovascular: Denies chest pain. Respiratory: Denies shortness  of breath. Gastrointestinal: No abdominal pain.  No nausea, no vomiting.  No diarrhea.  No constipation. Genitourinary: Negative for dysuria. Musculoskeletal: Negative for neck pain.  Negative for back pain.  Positive for right hand pain. Integumentary: Negative for rash. Neurological: Negative for headaches, focal weakness or numbness.   ____________________________________________   PHYSICAL EXAM:  VITAL SIGNS: ED Triage Vitals  Enc Vitals Group     BP 07/03/18 1616 (!) 189/63     Pulse Rate 07/03/18 1616 64     Resp 07/03/18 1616 16     Temp 07/03/18 1616 98 F (36.7 C)     Temp Source 07/03/18 1616 Oral     SpO2 07/03/18 1616 97 %     Weight 07/03/18 1617 63 kg (138 lb 14.2 oz)     Height 07/03/18 1617 1.6 m (5\' 3" )     Head Circumference --      Peak Flow --      Pain Score 07/03/18 1615 7     Pain Loc --      Pain Edu? --      Excl. in GC? --     Constitutional: Alert and oriented. Well appearing and in no acute distress. Eyes: Conjunctivae are normal. PERRL. EOMI. Head: Forehead swelling noted. Mouth/Throat: Mucous membranes are moist. Oropharynx non-erythematous. Neck: No stridor.  No cervical spine tenderness to palpation. Cardiovascular: Normal rate, regular rhythm. Good peripheral circulation. Grossly normal heart sounds. Respiratory: Normal respiratory effort.  No retractions. Lungs CTAB. Gastrointestinal: Soft and nontender. No distention.  Musculoskeletal: No lower extremity tenderness nor edema. No gross deformities of extremities. Neurologic:  Normal speech and language. No gross focal neurologic deficits are appreciated.  Skin:  Skin is warm, dry and intact. No rash noted. Psychiatric: Mood and affect are normal. Speech and behavior are normal. _________________________________ RADIOLOGY I, Greenbriar N Analiza Cowger, personally viewed and evaluated these images (plain radiographs) as part of my medical decision making, as well as reviewing the written report by  the radiologist.  ED MD interpretation: No evidence of acute intracranial abnormality noted on CT scan of the head per radiologist.  Distal fifth metacarpal fracture on right hand x-ray noted.  Official radiology report(s): Ct Head Wo Contrast  Result Date: 07/03/2018 CLINICAL DATA:  85 year old female with fall and head injury 3 days ago. EXAM: CT HEAD WITHOUT CONTRAST TECHNIQUE: Contiguous axial images were obtained from the base of the skull through the vertex without intravenous contrast. COMPARISON:  None. FINDINGS: Brain: No evidence of acute infarction, hemorrhage, hydrocephalus, extra-axial collection or mass lesion/mass effect. Mild atrophy and mild probable chronic small-vessel white matter ischemic changes noted. Vascular: Carotid atherosclerotic vascular calcifications noted. Skull: Normal. Negative for fracture or focal lesion. Sinuses/Orbits: No acute finding. Other: None. IMPRESSION: 1. No evidence of acute intracranial abnormality 2. Mild atrophy and mild probable chronic small-vessel white matter ischemic changes. Electronically Signed   By: Harmon PierJeffrey  Hu M.D.   On: 07/03/2018 16:47   Dg Hand Complete  Right  Result Date: 07/03/2018 CLINICAL DATA:  Acute RIGHT hand pain following fall 3 days ago. Initial encounter. EXAM: RIGHT HAND - COMPLETE 3+ VIEW COMPARISON:  None. FINDINGS: A nondisplaced fracture of the distal 5th metacarpal is age indeterminate. Correlate with pain. No other acute fracture, subluxation or dislocation identified. Degenerative changes at the radiocarpal joint noted. IMPRESSION: Distal 5th metacarpal fracture, age indeterminate - correlate with pain. Electronically Signed   By: Harmon PierJeffrey  Hu M.D.   On: 07/03/2018 16:42     Procedures   ____________________________________________   INITIAL IMPRESSION / ASSESSMENT AND PLAN / ED COURSE  As part of my medical decision making, I reviewed the following data within the electronic MEDICAL RECORD NUMBER   85 year old  female presenting with above-stated history and physical, right hand pain and head injury following accidental fall.  CT scan of the head was performed which revealed no acute intracranial abnormality.  Right hand x-ray revealed a distal right fifth metacarpal fracture.  Patient states that her pain at this time is "not bad".  And as such patient was given ibuprofen 400 mg ulnar gutter splint applied.  Patient referred to Dr. Reginia NaasPosey for further outpatient evaluation and management. ____________________________________________  FINAL CLINICAL IMPRESSION(S) / ED DIAGNOSES  Final diagnoses:  Closed displaced fracture of neck of fifth metacarpal bone of right hand, initial encounter     MEDICATIONS GIVEN DURING THIS VISIT:  Medications  ibuprofen (ADVIL,MOTRIN) tablet 400 mg (has no administration in time range)     ED Discharge Orders    None       Note:  This document was prepared using Dragon voice recognition software and may include unintentional dictation errors.    Darci CurrentBrown, Breese N, MD 07/03/18 [id number]

## 2018-07-03 NOTE — ED Notes (Signed)
AAOx3.  Skin warm and dry.  NAD 

## 2018-07-03 NOTE — ED Triage Notes (Signed)
Patient reports she tripped and fell Saturday night. Reports she caught herself with her hands and is no complaining of pain to right hand and left thumb. Swelling noted to right hand. Patient also states she hit her head but denies LOC or headache or dizziness since fall. Patient denies using blood thinners. Small hematoma noted to forehead. A&O x4 upon arrival.

## 2018-08-28 ENCOUNTER — Ambulatory Visit (INDEPENDENT_AMBULATORY_CARE_PROVIDER_SITE_OTHER): Payer: Medicare HMO | Admitting: Family Medicine

## 2018-08-28 ENCOUNTER — Other Ambulatory Visit: Payer: Self-pay

## 2018-08-28 ENCOUNTER — Encounter: Payer: Self-pay | Admitting: Family Medicine

## 2018-08-28 VITALS — BP 140/80 | HR 77 | Temp 97.9°F | Resp 14 | Ht 63.0 in | Wt 142.4 lb

## 2018-08-28 DIAGNOSIS — K640 First degree hemorrhoids: Secondary | ICD-10-CM | POA: Diagnosis not present

## 2018-08-28 DIAGNOSIS — K625 Hemorrhage of anus and rectum: Secondary | ICD-10-CM | POA: Diagnosis not present

## 2018-08-28 LAB — HEMOCCULT GUIAC POC 1CARD (OFFICE): Fecal Occult Blood, POC: NEGATIVE

## 2018-08-28 LAB — CBC WITH DIFFERENTIAL/PLATELET
Absolute Monocytes: 385 cells/uL (ref 200–950)
BASOS ABS: 38 {cells}/uL (ref 0–200)
Basophils Relative: 0.8 %
EOS ABS: 61 {cells}/uL (ref 15–500)
Eosinophils Relative: 1.3 %
HEMATOCRIT: 30.5 % — AB (ref 35.0–45.0)
Hemoglobin: 9.5 g/dL — ABNORMAL LOW (ref 11.7–15.5)
LYMPHS ABS: 1278 {cells}/uL (ref 850–3900)
MCH: 25.8 pg — AB (ref 27.0–33.0)
MCHC: 31.1 g/dL — AB (ref 32.0–36.0)
MCV: 82.9 fL (ref 80.0–100.0)
MPV: 11.1 fL (ref 7.5–12.5)
Monocytes Relative: 8.2 %
Neutro Abs: 2938 cells/uL (ref 1500–7800)
Neutrophils Relative %: 62.5 %
Platelets: 238 10*3/uL (ref 140–400)
RBC: 3.68 10*6/uL — ABNORMAL LOW (ref 3.80–5.10)
RDW: 14 % (ref 11.0–15.0)
Total Lymphocyte: 27.2 %
WBC: 4.7 10*3/uL (ref 3.8–10.8)

## 2018-08-28 MED ORDER — HYDROCORTISONE 2.5 % RE CREA: 1.0000 "application " | TOPICAL_CREAM | Freq: Two times a day (BID) | RECTAL | 0 refills | Status: DC

## 2018-08-28 NOTE — Progress Notes (Signed)
Name: Karen Davila   MRN: [id number]    DOB: 1918/05/24   Date:08/28/2018       Progress Note  Subjective  Chief Complaint  Chief Complaint  Patient presents with  . Rectal Bleeding    HPI  Pt presents with concern for very small amount of bright red blood on her stool in one BM yesterday (has had a few BM's since then with no blood in stool). She had taken a stool softener (colace) to help with her constipation yesterday and had one loose BM with a small amount or BRB in the stool, the toilet bowel water remained clear with no pink tinge, no blood on her toilet paper.  She has ongoing history of constipation.  She denies abdominal pain, nausea, vomiting, lightheadedness/near-syncope. She denies any other complaint.  Did have open dialogue regarding evaluation and treatment for rectal bleeding if not hemorrhoid.  If no visible hemorrhoid, would like to monitor for a few weeks prior to sending to GI as she would likely opt to not proceed with colonoscopy due to her advanced age.  She does want to proceed with CBC (has history of mild to moderate anemia - last check was stable in December 2019) and rectal examination today.  Patient Active Problem List   Diagnosis Date Noted  . Anemia 06/15/2018  . Atherosclerosis of aorta (HCC) 05/16/2018  . Venous insufficiency 08/08/2017  . Lichen simplex chronicus 08/08/2017  . Swelling of limb 05/30/2017  . Left arm pain 08/30/2016  . Bilateral leg cramps 07/06/2016  . Chronic kidney disease, stage 3 (HCC) 07/07/2015  . Cardiac murmur, previously undiagnosed 04/06/2015  . Dementia (HCC) 04/06/2015  . Difficulty hearing 04/06/2015  . OP (osteoporosis) 04/06/2015  . Hypertension 12/23/2014  . GERD (gastroesophageal reflux disease) 12/23/2014  . Adult hypothyroidism 12/23/2014  . Hyperlipidemia 12/23/2014  . Iron deficiency anemia 12/23/2014    Social History   Tobacco Use  . Smoking status: Never Smoker  . Smokeless tobacco: Never Used  .  Tobacco comment: smoking cessation materials not required  Substance Use Topics  . Alcohol use: No    Alcohol/week: 0.0 standard drinks     Current Outpatient Medications:  .  aspirin EC 81 MG tablet, Take 81 mg by mouth daily., Disp: , Rfl:  .  atorvastatin (LIPITOR) 10 MG tablet, Take 1 tablet (10 mg total) by mouth daily., Disp: 90 tablet, Rfl: 0 .  bisacodyl (DULCOLAX) 10 MG suppository, Place 1 suppository (10 mg total) rectally as needed for moderate constipation or severe constipation., Disp: 10 suppository, Rfl: 0 .  docusate sodium (COLACE) 100 MG capsule, Take 1 capsule (100 mg total) by mouth 2 (two) times daily., Disp: 60 capsule, Rfl: 2 .  Elastic Bandages & Supports (TRUFORM STOCKINGS 20-30MMHG) MISC, Wear stockings, put on in the morning and remove at night, Disp: 2 each, Rfl: 1 .  levothyroxine (SYNTHROID, LEVOTHROID) 50 MCG tablet, TAKE 1 TABLET BY MOUTH ONCE DAILY BEFORE BREAKFAST, Disp: 90 tablet, Rfl: 1 .  Lidocaine 0.5 % GEL, Apply 1 application topically daily., Disp: 1 Tube, Rfl: 0 .  lisinopril (PRINIVIL,ZESTRIL) 20 MG tablet, Take 1 tablet (20 mg total) by mouth daily., Disp: 90 tablet, Rfl: 0 .  Olopatadine HCl 0.2 % SOLN, Apply 1 drop to eye every morning., Disp: 1 Bottle, Rfl: 0 .  polyethylene glycol (MIRALAX) packet, Take 17 g by mouth daily., Disp: 14 each, Rfl: 0 .  triamcinolone cream (KENALOG) 0.1 %, Apply 1 application topically 2 (  two) times daily. (skin over the ankles and lower legs), Disp: 80 g, Rfl: 1 .  amLODipine (NORVASC) 5 MG tablet, Take 1.5 tablets (7.5 mg total) by mouth daily., Disp: 135 tablet, Rfl: 0 .  ranitidine (ZANTAC) 150 MG tablet, TAKE 1 TABLET BY MOUTH AT BEDTIME FOR STOMACH (Patient not taking: Reported on 08/28/2018), Disp: 90 tablet, Rfl: 1  No Known Allergies  I personally reviewed active problem list, medication list, allergies, notes from last encounter, lab results with the patient/caregiver today.  ROS  Constitutional:  Negative for fever or weight change.  Respiratory: Negative for cough and shortness of breath.   Cardiovascular: Negative for chest pain or palpitations.  Gastrointestinal: Negative for abdominal pain; positive for bowel changes.  Musculoskeletal: Negative for gait problem or joint swelling.  Skin: Negative for rash.  Neurological: Negative for dizziness or headache.  No other specific complaints in a complete review of systems (except as listed in HPI above).  Objective  Vitals:   08/28/18 1010  BP: 140/80  Pulse: 77  Resp: 14  Temp: 97.9 F (36.6 C)  TempSrc: Oral  SpO2: 99%  Weight: 142 lb 6.4 oz (64.6 kg)  Height: 5\' 3"  (1.6 m)   Body mass index is 25.23 kg/m.  Nursing Note and Vital Signs reviewed.  Physical Exam  Constitutional: Patient appears well-developed and well-nourished. No distress.  HENT: Head: Normocephalic and atraumatic. Ears: B TMs ok, no erythema or effusion; Nose: Nose normal. Mouth/Throat: Oropharynx is clear and moist. No oropharyngeal exudate.  Eyes: Conjunctivae and EOM are normal. Pupils are equal, round, and reactive to light. No scleral icterus.  Neck: Normal range of motion. Neck supple. No JVD present. No thyromegaly present.  Cardiovascular: Normal rate, regular rhythm and normal heart sounds.  No murmur heard. No BLE edema. Pulmonary/Chest: Effort normal and breath sounds normal. No respiratory distress. Abdominal: Soft. Bowel sounds are normal, no distension. There is no tenderness. no masses RECTAL: no rectal masses, negative hemoccult, there is a grade 1 non-bleeding, non-thrombosed hemorrhoid at the 4:00-5:00 position, non-tender. Musculoskeletal: Normal range of motion, no joint effusions. No gross deformities Neurological: he is alert and oriented to person, place, and time. No cranial nerve deficit. Coordination, balance, strength, speech and gait are normal.  Skin: Skin is warm and dry. No rash noted. No erythema.  Psychiatric: Patient  has a normal mood and affect. behavior is normal. Judgment and thought content normal.  No results found for this or any previous visit (from the past 72 hour(s)).  Assessment & Plan  1. BRBPR (bright red blood per rectum) - CBC w/Diff/Platelet - POCT Occult Blood Stool - Reassurance is provided, did have open discussion about the yield of sending to GI for further evaluation, and she would like to monitor for a few weeks, will RTC for referral if worsening/persistent symptoms.  2. Grade I hemorrhoids - hydrocortisone (ANUSOL-HC) 2.5 % rectal cream; Place 1 application rectally 2 (two) times daily.  Dispense: 30 g; Refill: 0  -Red flags and when to present for emergency care or RTC including fever >101.51F, chest pain, shortness of breath, new/worsening/un-resolving symptoms, lightheadedness/near syncope, recurrent rectal bleeding reviewed with patient at time of visit. Follow up and care instructions discussed and provided in AVS.

## 2018-10-15 ENCOUNTER — Other Ambulatory Visit: Payer: Self-pay | Admitting: Family Medicine

## 2018-10-15 ENCOUNTER — Other Ambulatory Visit: Payer: Self-pay | Admitting: Nurse Practitioner

## 2018-10-15 DIAGNOSIS — E7800 Pure hypercholesterolemia, unspecified: Secondary | ICD-10-CM

## 2018-10-15 DIAGNOSIS — I10 Essential (primary) hypertension: Secondary | ICD-10-CM

## 2018-10-15 DIAGNOSIS — E038 Other specified hypothyroidism: Secondary | ICD-10-CM

## 2018-11-30 ENCOUNTER — Other Ambulatory Visit: Payer: Self-pay | Admitting: Nurse Practitioner

## 2018-11-30 ENCOUNTER — Other Ambulatory Visit: Payer: Self-pay | Admitting: Family Medicine

## 2018-11-30 DIAGNOSIS — E7800 Pure hypercholesterolemia, unspecified: Secondary | ICD-10-CM

## 2018-11-30 DIAGNOSIS — I10 Essential (primary) hypertension: Secondary | ICD-10-CM

## 2018-11-30 DIAGNOSIS — E038 Other specified hypothyroidism: Secondary | ICD-10-CM

## 2018-12-17 ENCOUNTER — Ambulatory Visit: Payer: Self-pay | Admitting: Family Medicine

## 2019-02-08 ENCOUNTER — Other Ambulatory Visit: Payer: Self-pay | Admitting: Nurse Practitioner

## 2019-02-08 DIAGNOSIS — I10 Essential (primary) hypertension: Secondary | ICD-10-CM

## 2019-02-08 NOTE — Telephone Encounter (Signed)
Schedule routine follow-up in 4 months

## 2019-02-12 NOTE — Telephone Encounter (Signed)
LVM TO [facility] APPT °

## 2019-03-19 DIAGNOSIS — H52223 Regular astigmatism, bilateral: Secondary | ICD-10-CM | POA: Diagnosis not present

## 2019-03-19 DIAGNOSIS — H5203 Hypermetropia, bilateral: Secondary | ICD-10-CM | POA: Diagnosis not present

## 2019-03-19 DIAGNOSIS — H43811 Vitreous degeneration, right eye: Secondary | ICD-10-CM | POA: Diagnosis not present

## 2019-03-19 DIAGNOSIS — H2512 Age-related nuclear cataract, left eye: Secondary | ICD-10-CM | POA: Diagnosis not present

## 2019-03-19 DIAGNOSIS — Z961 Presence of intraocular lens: Secondary | ICD-10-CM | POA: Diagnosis not present

## 2019-03-19 DIAGNOSIS — H10233 Serous conjunctivitis, except viral, bilateral: Secondary | ICD-10-CM | POA: Diagnosis not present

## 2019-03-19 DIAGNOSIS — H524 Presbyopia: Secondary | ICD-10-CM | POA: Diagnosis not present

## 2019-03-19 DIAGNOSIS — H3550 Unspecified hereditary retinal dystrophy: Secondary | ICD-10-CM | POA: Diagnosis not present

## 2019-03-27 ENCOUNTER — Other Ambulatory Visit: Payer: Self-pay

## 2019-03-27 ENCOUNTER — Ambulatory Visit (INDEPENDENT_AMBULATORY_CARE_PROVIDER_SITE_OTHER): Payer: Medicare HMO | Admitting: Family Medicine

## 2019-03-27 ENCOUNTER — Encounter: Payer: Self-pay | Admitting: Family Medicine

## 2019-03-27 DIAGNOSIS — M79605 Pain in left leg: Secondary | ICD-10-CM | POA: Diagnosis not present

## 2019-03-27 DIAGNOSIS — M79671 Pain in right foot: Secondary | ICD-10-CM

## 2019-03-27 DIAGNOSIS — R252 Cramp and spasm: Secondary | ICD-10-CM

## 2019-03-27 DIAGNOSIS — M79604 Pain in right leg: Secondary | ICD-10-CM | POA: Diagnosis not present

## 2019-03-27 DIAGNOSIS — M79672 Pain in left foot: Secondary | ICD-10-CM

## 2019-03-27 MED ORDER — DICLOFENAC SODIUM 1 % TD GEL: 2.0000 g | Freq: Four times a day (QID) | TRANSDERMAL | 1 refills | Status: DC

## 2019-03-27 NOTE — Progress Notes (Signed)
Name: Karen Davila   MRN: [id number]    DOB: [birth year]   Date:03/27/2019       Progress Note  Subjective  Chief Complaint  Chief Complaint  Patient presents with  . Leg Pain    bilateral, fell in kitchen,pain radiates to hip left    I connected with  Lindon Romp Finlayson on 03/27/19 at 10:00 AM EDT by telephone and verified that I am speaking with the correct person using two identifiers.   I discussed the limitations, risks, security and privacy concerns of performing an evaluation and management service by telephone and the availability of in person appointments. Staff also discussed with the patient that there may be a patient responsible charge related to this service. Patient Location: Home Provider Location: Home Office Additional Individuals present: None  HPI  Pt presents with concern for bilateral leg pain - states the pain is in different places at different time.  She does have days without pain; getting around her home ok.  She states did have a fall about 5 days ago, but the pain has been ongoing for many months prior to this.  Denies injury from the fall 5 days ago and states she is ambulating without issues, though she is quite hard of hearing a somewhat poor historian.   Patient Active Problem List   Diagnosis Date Noted  . Anemia 06/15/2018  . Atherosclerosis of aorta (HCC) 05/16/2018  . Venous insufficiency 08/08/2017  . Lichen simplex chronicus 08/08/2017  . Swelling of limb 05/30/2017  . Left arm pain 08/30/2016  . Bilateral leg cramps 07/06/2016  . Chronic kidney disease, stage 3 07/07/2015  . Cardiac murmur, previously undiagnosed 04/06/2015  . Dementia (HCC) 04/06/2015  . Difficulty hearing 04/06/2015  . OP (osteoporosis) 04/06/2015  . Hypertension 12/23/2014  . GERD (gastroesophageal reflux disease) 12/23/2014  . Adult hypothyroidism 12/23/2014  . Hyperlipidemia 12/23/2014  . Iron deficiency anemia 12/23/2014    Social History   Tobacco Use  .  Smoking status: Never Smoker  . Smokeless tobacco: Never Used  . Tobacco comment: smoking cessation materials not required  Substance Use Topics  . Alcohol use: No    Alcohol/week: 0.0 standard drinks     Current Outpatient Medications:  .  amLODipine (NORVASC) 5 MG tablet, TAKE 1 & 1/2 (ONE & ONE-HALF) TABLETS BY MOUTH DAILY, Disp: 135 tablet, Rfl: 0 .  aspirin EC 81 MG tablet, Take 81 mg by mouth daily., Disp: , Rfl:  .  atorvastatin (LIPITOR) 10 MG tablet, Take 1 tablet by mouth once daily, Disp: 90 tablet, Rfl: 0 .  bisacodyl (DULCOLAX) 10 MG suppository, Place 1 suppository (10 mg total) rectally as needed for moderate constipation or severe constipation., Disp: 10 suppository, Rfl: 0 .  docusate sodium (COLACE) 100 MG capsule, Take 1 capsule (100 mg total) by mouth 2 (two) times daily., Disp: 60 capsule, Rfl: 2 .  Elastic Bandages & Supports (TRUFORM STOCKINGS 20-30MMHG) MISC, Wear stockings, put on in the morning and remove at night, Disp: 2 each, Rfl: 1 .  famotidine (PEPCID) 20 MG tablet, Take 1 tablet (20 mg total) by mouth daily., Disp: 90 tablet, Rfl: 0 .  hydrocortisone (ANUSOL-HC) 2.5 % rectal cream, Place 1 application rectally 2 (two) times daily., Disp: 30 g, Rfl: 0 .  levothyroxine (SYNTHROID) 50 MCG tablet, TAKE 1 TABLET BY MOUTH ONCE DAILY BEFORE BREAKFAST, Disp: 90 tablet, Rfl: 0 .  Lidocaine 0.5 % GEL, Apply 1 application topically daily., Disp: 1 Tube, Rfl:  0 .  lisinopril (ZESTRIL) 20 MG tablet, Take 1 tablet by mouth once daily, Disp: 90 tablet, Rfl: 0 .  Olopatadine HCl 0.2 % SOLN, Apply 1 drop to eye every morning., Disp: 1 Bottle, Rfl: 0 .  polyethylene glycol (MIRALAX) packet, Take 17 g by mouth daily., Disp: 14 each, Rfl: 0 .  triamcinolone cream (KENALOG) 0.1 %, Apply 1 application topically 2 (two) times daily. (skin over the ankles and lower legs), Disp: 80 g, Rfl: 1  No Known Allergies  I personally reviewed active problem list, medication list, allergies,  notes from last encounter, lab results with the patient/caregiver today.  ROS  Ten systems reviewed and is negative except as mentioned in HPI  Objective  Virtual encounter, vitals not obtained.  There is no height or weight on file to calculate BMI.  Nursing Note and Vital Signs reviewed.  Physical Exam  Pulmonary/Chest: Effort normal. No respiratory distress. Speaking in complete sentences Neurological: Pt is alert and oriented to person, place, and situation. Speech is baseline - loud due to hard of hearing, otherwise normal, no dysarthria. Psychiatric: Patient has a normal mood and affect. behavior is normal. Judgment and thought content normal.    No results found for this or any previous visit (from the past 72 hour(s)).  Assessment & Plan  1. Bilateral leg and foot pain - Advised my examination is quite limited, however due to her advanced age and COVID-57 pandemic, she preferred to do virtual visit.  She denies any injury from recent fall, though I advised if any bruising or ongoing acute pain occurs she needs to call us back.  We will trial voltaren gel PRN and tylenol PRN for her pain. - diclofenac sodium (VOLTAREN) 1 % GEL; Apply 2 g topically 4 (four) times daily.  Dispense: 150 g; Refill: 1  2. Bilateral leg cramps - diclofenac sodium (VOLTAREN) 1 % GEL; Apply 2 g topically 4 (four) times daily.  Dispense: 150 g; Refill: 1  -Red flags and when to present for emergency care or RTC including fever >101.49F, chest pain, shortness of breath, new/worsening/un-resolving symptoms, reviewed with patient at time of visit. Follow up and care instructions discussed and provided in AVS. - I discussed the assessment and treatment plan with the patient. The patient was provided an opportunity to ask questions and all were answered. The patient agreed with the plan and demonstrated an understanding of the instructions.  - The patient was advised to call back or seek an in-person  evaluation if the symptoms worsen or if the condition fails to improve as anticipated.  I provided 16 minutes of non-face-to-face time during this encounter.  Hubbard Hartshorn, FNP

## 2019-04-11 ENCOUNTER — Ambulatory Visit (INDEPENDENT_AMBULATORY_CARE_PROVIDER_SITE_OTHER): Payer: Medicare HMO | Admitting: Family Medicine

## 2019-04-11 ENCOUNTER — Ambulatory Visit
Admission: RE | Admit: 2019-04-11 | Discharge: 2019-04-11 | Disposition: A | Discharge status: Home / Self Care | Payer: Medicare HMO | Source: Ambulatory Visit | Attending: Family Medicine | Admitting: Family Medicine

## 2019-04-11 ENCOUNTER — Encounter: Payer: Self-pay | Admitting: Family Medicine

## 2019-04-11 ENCOUNTER — Other Ambulatory Visit: Payer: Self-pay

## 2019-04-11 VITALS — BP 150/58 | HR 76 | Temp 97.1°F | Resp 14 | Wt 138.2 lb

## 2019-04-11 DIAGNOSIS — Z23 Encounter for immunization: Secondary | ICD-10-CM | POA: Diagnosis not present

## 2019-04-11 DIAGNOSIS — M79672 Pain in left foot: Secondary | ICD-10-CM | POA: Insufficient documentation

## 2019-04-11 DIAGNOSIS — M7989 Other specified soft tissue disorders: Secondary | ICD-10-CM | POA: Diagnosis not present

## 2019-04-11 DIAGNOSIS — W19XXXA Unspecified fall, initial encounter: Secondary | ICD-10-CM

## 2019-04-11 DIAGNOSIS — M25472 Effusion, left ankle: Secondary | ICD-10-CM | POA: Diagnosis not present

## 2019-04-11 DIAGNOSIS — S9032XA Contusion of left foot, initial encounter: Secondary | ICD-10-CM

## 2019-04-11 DIAGNOSIS — S99922A Unspecified injury of left foot, initial encounter: Secondary | ICD-10-CM | POA: Diagnosis not present

## 2019-04-11 DIAGNOSIS — S99912A Unspecified injury of left ankle, initial encounter: Secondary | ICD-10-CM | POA: Diagnosis not present

## 2019-04-11 NOTE — Patient Instructions (Signed)
Go get X-rays, just to be sure nothing is broken  If you have a minor injury to you foot or ankle is can swell and be healing for several weeks.  The more you use it or walk, the more it can swell.  Elevate your legs and apply ice to places that hurt with swelling.    But some swelling is normal after an injury.  And if the swelling is not hurting, its probably fine.  Please follow up if you have any new or worsening pain.   RICE Therapy for Routine Care of Injuries Many injuries can be cared for with rest, ice, compression, and elevation (RICE therapy). This includes:  Resting the injured part.  Putting ice on the injury.  Putting pressure (compression) on the injury.  Raising the injured part (elevation). Using RICE therapy can help to lessen pain and swelling. Supplies needed:  Ice.  Plastic bag.  Towel.  Elastic bandage.  Pillow or pillows to raise (elevate) your injured body part. How to care for your injury with RICE therapy Rest Limit your normal activities, and try not to use the injured part of your body. You can go back to your normal activities when your doctor says it is okay to do them and you feel okay. Ask your doctor if you should do exercises to help your injury get better. Ice Put ice on the injured area. Do not put ice on your bare skin.  Put ice in a plastic bag.  Place a towel between your skin and the bag.  Leave the ice on for 20 minutes, 2-3 times a day. Use ice on as many days as told by your doctor.  Compression Compression means putting pressure on the injured area. This can be done with an elastic bandage. If an elastic bandage has been put on your injury:  Do not wrap the bandage too tight. Wrap the bandage more loosely if part of your body away from the bandage is blue, swollen, cold, painful, or loses feeling (gets numb).  Take off the bandage and put it on again. Do this every 3-4 hours or as told by your doctor.  See your doctor if  the bandage seems to make your problems worse.  Elevation Elevation means keeping the injured area raised. If you can, raise the injured area above your heart or the center of your chest. Contact a doctor if:  You keep having pain and swelling.  Your symptoms get worse. Get help right away if:  You have sudden bad pain at your injury or lower than your injury.  You have redness or more swelling around your injury.  You have tingling or numbness at your injury or lower than your injury, and it does not go away when you take off the bandage. Summary  Many injuries can be cared for using rest, ice, compression, and elevation (RICE therapy).  You can go back to your normal activities when you feel okay and your doctor says it is okay.  Put ice on the injured area as told by your doctor.  Get help if your symptoms get worse or if you keep having pain and swelling. This information is not intended to replace advice given to you by your health care provider. Make sure you discuss any questions you have with your health care provider. Document Released: 11/16/2007 Document Revised: 02/17/2017 Document Reviewed: 02/17/2017 Elsevier Patient Education  2020 [employer].

## 2019-04-11 NOTE — Progress Notes (Signed)
Patient ID: Karen Davila, female    DOB: 21-Dec-1917, 85 y.o.   MRN: [id number]  PCP: [name], PA-C  Chief Complaint  Patient presents with  . Foot Pain    fall, left side of left foot pain    Subjective:   Karen Davila is a 85 y.o. female, presents to clinic with CC of the following:  Foot Injury  Incident onset: 2 weeks ago. The incident occurred at home. The injury mechanism was a direct blow (kicked something in her kitchen and fell over). The pain is present in the left foot. The quality of the pain is described as aching. The pain is mild. The pain has been fluctuating since onset. Pertinent negatives include no inability to bear weight, loss of motion, loss of sensation, muscle weakness, numbness or tingling. She reports no foreign bodies present. The symptoms are aggravated by weight bearing. She has tried nothing for the symptoms.   Pt is here today because after she hit her foot, she was able to get up and she denies any other areas of injury or pain at the time of the fall.  She has been able to ambulate without difficulty she uses a rolling walker sometimes at home and other times uses a cane for stability but she denies any limping or any changes to her gait.  She has had some constant pain to the left dorsal aspect of her left foot but that did seem to get gradually better and she has been very active and walking a lot over the last week she is here today because her ankle has began to swell even though she has no pain.  There is no bruising, redness or deformity.  She denies any pain or swelling further up her leg.  She has not been evaluated before today   Patient Active Problem List   Diagnosis Date Noted  . Anemia 06/15/2018  . Atherosclerosis of aorta (HCC) 05/16/2018  . Venous insufficiency 08/08/2017  . Lichen simplex chronicus 08/08/2017  . Swelling of limb 05/30/2017  . Left arm pain 08/30/2016  . Bilateral leg cramps 07/06/2016  . Chronic kidney disease,  stage 3 07/07/2015  . Cardiac murmur, previously undiagnosed 04/06/2015  . Dementia (HCC) 04/06/2015  . Difficulty hearing 04/06/2015  . OP (osteoporosis) 04/06/2015  . Hypertension 12/23/2014  . GERD (gastroesophageal reflux disease) 12/23/2014  . Adult hypothyroidism 12/23/2014  . Hyperlipidemia 12/23/2014  . Iron deficiency anemia 12/23/2014      Current Outpatient Medications:  .  amLODipine (NORVASC) 5 MG tablet, TAKE 1 & 1/2 (ONE & ONE-HALF) TABLETS BY MOUTH DAILY, Disp: 135 tablet, Rfl: 0 .  aspirin EC 81 MG tablet, Take 81 mg by mouth daily., Disp: , Rfl:  .  atorvastatin (LIPITOR) 10 MG tablet, Take 1 tablet by mouth once daily, Disp: 90 tablet, Rfl: 0 .  bisacodyl (DULCOLAX) 10 MG suppository, Place 1 suppository (10 mg total) rectally as needed for moderate constipation or severe constipation., Disp: 10 suppository, Rfl: 0 .  diclofenac sodium (VOLTAREN) 1 % GEL, Apply 2 g topically 4 (four) times daily., Disp: 150 g, Rfl: 1 .  docusate sodium (COLACE) 100 MG capsule, Take 1 capsule (100 mg total) by mouth 2 (two) times daily., Disp: 60 capsule, Rfl: 2 .  Elastic Bandages & Supports (TRUFORM STOCKINGS 20-30MMHG) MISC, Wear stockings, put on in the morning and remove at night, Disp: 2 each, Rfl: 1 .  famotidine (PEPCID) 20 MG tablet, Take 1 tablet (  20 mg total) by mouth daily., Disp: 90 tablet, Rfl: 0 .  hydrocortisone (ANUSOL-HC) 2.5 % rectal cream, Place 1 application rectally 2 (two) times daily., Disp: 30 g, Rfl: 0 .  levothyroxine (SYNTHROID) 50 MCG tablet, TAKE 1 TABLET BY MOUTH ONCE DAILY BEFORE BREAKFAST, Disp: 90 tablet, Rfl: 0 .  Lidocaine 0.5 % GEL, Apply 1 application topically daily., Disp: 1 Tube, Rfl: 0 .  lisinopril (ZESTRIL) 20 MG tablet, Take 1 tablet by mouth once daily, Disp: 90 tablet, Rfl: 0 .  Olopatadine HCl 0.2 % SOLN, Apply 1 drop to eye every morning., Disp: 1 Bottle, Rfl: 0 .  polyethylene glycol (MIRALAX) packet, Take 17 g by mouth daily., Disp: 14  each, Rfl: 0 .  triamcinolone cream (KENALOG) 0.1 %, Apply 1 application topically 2 (two) times daily. (skin over the ankles and lower legs), Disp: 80 g, Rfl: 1   No Known Allergies   Family History  Problem Relation Age of Onset  . Stroke Mother   . Healthy Father      Social History   Socioeconomic History  . Marital status: Single    Spouse name: Not on file  . Number of children: 0  . Years of education: Not on file  . Highest education level: 5th grade  Occupational History  . Occupation: Retired  Engineer, productionocial Needs  . Financial resource strain: Not hard at all  . Food insecurity    Worry: Never true    Inability: Never true  . Transportation needs    Medical: No    Non-medical: No  Tobacco Use  . Smoking status: Never Smoker  . Smokeless tobacco: Never Used  . Tobacco comment: smoking cessation materials not required  Substance and Sexual Activity  . Alcohol use: No    Alcohol/week: 0.0 standard drinks  . Drug use: No  . Sexual activity: Not Currently  Lifestyle  . Physical activity    Days per week: 0 days    Minutes per session: 0 min  . Stress: Not at all  Relationships  . Social Musicianconnections    Talks on phone: Patient refused    Gets together: Patient refused    Attends religious service: Patient refused    Active member of club or organization: Patient refused    Attends meetings of clubs or organizations: Patient refused    Relationship status: Patient refused  . Intimate partner violence    Fear of current or ex partner: No    Emotionally abused: No    Physically abused: No    Forced sexual activity: No  Other Topics Concern  . Not on file  Social History Narrative  . Not on file      Review of Systems  Constitutional: Negative.   HENT: Negative.   Eyes: Negative.   Respiratory: Negative.   Cardiovascular: Negative.   Gastrointestinal: Negative.   Endocrine: Negative.   Genitourinary: Negative.   Musculoskeletal: Negative.   Skin:  Negative.   Allergic/Immunologic: Negative.   Neurological: Negative.  Negative for tingling and numbness.  Hematological: Negative.   Psychiatric/Behavioral: Negative.   All other systems reviewed and are negative.      Objective:   Vitals:   04/11/19 1407  BP: (!) 150/58  Pulse: 76  Resp: 14  Temp: (!) 97.1 F (36.2 C)  SpO2: 97%  Weight: 138 lb 3.2 oz (62.7 kg)    Body mass index is 24.48 kg/m.  Physical Exam Vitals signs and nursing note reviewed.  Constitutional:  General: She is not in acute distress.    Appearance: She is well-developed and normal weight. She is not ill-appearing or toxic-appearing.  HENT:     Head: Normocephalic and atraumatic.     Nose: Nose normal.  Eyes:     General:        Right eye: No discharge.        Left eye: No discharge.     Conjunctiva/sclera: Conjunctivae normal.  Neck:     Trachea: No tracheal deviation.  Cardiovascular:     Pulses: Normal pulses.  Pulmonary:     Effort: Pulmonary effort is normal. No respiratory distress.     Breath sounds: No stridor.  Musculoskeletal: Normal range of motion.     Left ankle: She exhibits swelling. She exhibits normal range of motion, no ecchymosis, no deformity and normal pulse. No tenderness. No lateral malleolus, no medial malleolus, no AITFL, no CF ligament and no posterior TFL tenderness found. Achilles tendon normal.     Right lower leg: She exhibits no tenderness, no bony tenderness and no swelling. No edema.     Left lower leg: She exhibits no tenderness, no bony tenderness and no swelling. No edema.     Left foot: Normal range of motion and normal capillary refill. Tenderness and swelling present. No bony tenderness, crepitus, deformity or laceration.     Comments: Very mild ttp to lateral dorsal foot  Skin:    General: Skin is warm and dry.     Findings: No rash.  Neurological:     Mental Status: She is alert.     Comments: Very HOH Slow gait with use of cane Normal  sensation to b/l LE Grossly normal movement and coordination in all extremities  Psychiatric:        Mood and Affect: Mood normal.        Behavior: Behavior is cooperative.            Assessment & Plan:      ICD-10-CM   1. Contusion of left foot, initial encounter  S90.32XA DG Foot Complete Left   mild swelling, normal gait, sensation and strength, likely normal swelling after injury, but getting xrays to be sure no fx, RICE tx advised  2. Left foot pain  M79.672 DG Foot Complete Left   left lateral dorsal foot pain after contusion  3. Left ankle swelling  M25.472 DG Ankle Complete Left   new swelling this week after injury 2 weeks ago  4. Fall, initial encounter  W19.XXXA    mechanical fall in kitchen, tripped over something in house, able to get herself up, left lateral foot pain, able to ambulate  5. Need for influenza vaccination  Z23 Flu Vaccine QUAD High Dose(Fluad)        Delsa Grana, PA-C 04/11/19 2:27 PM

## 2019-04-17 ENCOUNTER — Emergency Department
Admission: EM | Admit: 2019-04-17 | Discharge: 2019-04-17 | Disposition: A | Discharge status: Home / Self Care | Payer: Medicare HMO | Attending: Emergency Medicine | Admitting: Emergency Medicine

## 2019-04-17 ENCOUNTER — Other Ambulatory Visit: Payer: Self-pay

## 2019-04-17 ENCOUNTER — Emergency Department: Payer: Medicare HMO

## 2019-04-17 DIAGNOSIS — R109 Unspecified abdominal pain: Secondary | ICD-10-CM | POA: Diagnosis not present

## 2019-04-17 DIAGNOSIS — R1030 Lower abdominal pain, unspecified: Secondary | ICD-10-CM | POA: Diagnosis not present

## 2019-04-17 DIAGNOSIS — E039 Hypothyroidism, unspecified: Secondary | ICD-10-CM | POA: Insufficient documentation

## 2019-04-17 DIAGNOSIS — F0390 Unspecified dementia without behavioral disturbance: Secondary | ICD-10-CM | POA: Insufficient documentation

## 2019-04-17 DIAGNOSIS — I129 Hypertensive chronic kidney disease with stage 1 through stage 4 chronic kidney disease, or unspecified chronic kidney disease: Secondary | ICD-10-CM | POA: Diagnosis not present

## 2019-04-17 DIAGNOSIS — K5900 Constipation, unspecified: Secondary | ICD-10-CM | POA: Diagnosis not present

## 2019-04-17 DIAGNOSIS — Z7982 Long term (current) use of aspirin: Secondary | ICD-10-CM | POA: Insufficient documentation

## 2019-04-17 DIAGNOSIS — N183 Chronic kidney disease, stage 3 unspecified: Secondary | ICD-10-CM | POA: Insufficient documentation

## 2019-04-17 DIAGNOSIS — K862 Cyst of pancreas: Secondary | ICD-10-CM | POA: Diagnosis not present

## 2019-04-17 DIAGNOSIS — Z79899 Other long term (current) drug therapy: Secondary | ICD-10-CM | POA: Insufficient documentation

## 2019-04-17 DIAGNOSIS — R1084 Generalized abdominal pain: Secondary | ICD-10-CM | POA: Diagnosis not present

## 2019-04-17 DIAGNOSIS — I10 Essential (primary) hypertension: Secondary | ICD-10-CM | POA: Diagnosis not present

## 2019-04-17 LAB — COMPREHENSIVE METABOLIC PANEL
ALT: 11 U/L (ref 0–44)
AST: 26 U/L (ref 15–41)
Albumin: 3.9 g/dL (ref 3.5–5.0)
Alkaline Phosphatase: 49 U/L (ref 38–126)
Anion gap: 10 (ref 5–15)
BUN: 11 mg/dL (ref 8–23)
CO2: 27 mmol/L (ref 22–32)
Calcium: 9.1 mg/dL (ref 8.9–10.3)
Chloride: 106 mmol/L (ref 98–111)
Creatinine, Ser: 0.88 mg/dL (ref 0.44–1.00)
GFR calc Af Amer: >60 mL/min (ref >60)
GFR calc non Af Amer: 54 mL/min — ABNORMAL LOW (ref >60)
Glucose, Bld: 92 mg/dL (ref 70–99)
Potassium: 3.5 mmol/L (ref 3.5–5.1)
Sodium: 143 mmol/L (ref 135–145)
Total Bilirubin: 0.5 mg/dL (ref 0.3–1.2)
Total Protein: 7 g/dL (ref 6.5–8.1)

## 2019-04-17 LAB — CBC WITH DIFFERENTIAL/PLATELET
Abs Immature Granulocytes: 0.02 10*3/uL (ref 0.00–0.07)
Basophils Absolute: 0 10*3/uL (ref 0.0–0.1)
Basophils Relative: 1 %
Eosinophils Absolute: 0.1 10*3/uL (ref 0.0–0.5)
Eosinophils Relative: 2 %
HCT: 29.6 % — ABNORMAL LOW (ref 36.0–46.0)
Hemoglobin: 9.1 g/dL — ABNORMAL LOW (ref 12.0–15.0)
Immature Granulocytes: 0 %
Lymphocytes Relative: 31 %
Lymphs Abs: 2 10*3/uL (ref 0.7–4.0)
MCH: 25.5 pg — ABNORMAL LOW (ref 26.0–34.0)
MCHC: 30.7 g/dL (ref 30.0–36.0)
MCV: 82.9 fL (ref 80.0–100.0)
Monocytes Absolute: 0.4 10*3/uL (ref 0.1–1.0)
Monocytes Relative: 6 %
Neutro Abs: 3.7 10*3/uL (ref 1.7–7.7)
Neutrophils Relative %: 60 %
Platelets: 244 10*3/uL (ref 150–400)
RBC: 3.57 MIL/uL — ABNORMAL LOW (ref 3.87–5.11)
RDW: 14.6 % (ref 11.5–15.5)
Smear Review: NORMAL
WBC: 6.3 10*3/uL (ref 4.0–10.5)
nRBC: 0 % (ref 0.0–0.2)

## 2019-04-17 LAB — URINALYSIS, ROUTINE W REFLEX MICROSCOPIC
Bilirubin Urine: NEGATIVE
Glucose, UA: NEGATIVE mg/dL
Hgb urine dipstick: NEGATIVE
Ketones, ur: NEGATIVE mg/dL
Leukocytes,Ua: NEGATIVE
Nitrite: NEGATIVE
Protein, ur: NEGATIVE mg/dL
Specific Gravity, Urine: 1.021 (ref 1.005–1.030)
pH: 6 (ref 5.0–8.0)

## 2019-04-17 LAB — LIPASE, BLOOD: Lipase: 28 U/L (ref 11–51)

## 2019-04-17 MED ORDER — IOHEXOL 300 MG/ML  SOLN
75.0000 mL | Freq: Once | INTRAMUSCULAR | Status: AC | PRN
  Administered 2019-04-17: 09:00:00 75 mL via INTRAVENOUS

## 2019-04-17 NOTE — ED Notes (Signed)
Patient transported to CT 

## 2019-04-17 NOTE — Discharge Instructions (Signed)
Patient came in for what she thought was constipation.  Her CT scan does not show any evidence of constipation.  I would hold off on further laxatives at this time.  Continue to stay hydrated.  Return to the ER for any other concerns.  1. No acute finding. 2. History of no bowel movements but no abnormal stool retention. There is instead fluid levels reaching the distal colon which may be from the history of laxative use. 3. 2.6 cm cystic pancreatic tail mass. There is likely enhancing nodular components, a worrisome finding, but overall dimensions are similar to a noncontrast abdominal CT in 2019. Further workup should be tailored to comorbidities.

## 2019-04-17 NOTE — ED Triage Notes (Addendum)
Pt here via [facility]. Per Ems pt hasn't had a bowel movement for three days and has taken laxatives with no relief. PT also c/o lower abdominal pain. Pt in NAD at this time.   Pt has hx of hypertension, bp 200/100 with EMS. Pt now 182/65. Pt states she has not taken any of her medicine today.

## 2019-04-17 NOTE — ED Notes (Signed)
Patient assisted to ambulate to restroom. Urine specimen collected and sent to lab. Patient repositioned in bed and warm blanket given.

## 2019-04-17 NOTE — ED Notes (Signed)
Pt ambulated with this RN and Parks Ranger to the toilet. Pt with an unsteady gait using a cane, pt states she fell three weeks ago and hurt her right hip. Pt states she is still having right hip pain.

## 2019-04-17 NOTE — ED Notes (Signed)
Patient back from CT. Call light in place and bed alarm on. Patient attached to cardiac monitor, BP and pulse ox. No needs expressed at this time.

## 2019-04-17 NOTE — ED Provider Notes (Signed)
[facility] Emergency Department Provider Note  ____________________________________________   First MD Initiated Contact with Patient 04/17/19 [phone]     (approximate)  I have reviewed the triage vital signs and the nursing notes.   HISTORY  Chief Complaint Constipation and Abdominal Pain    HPI NATHANIEL YADEN is a 85 y.o. female with CKD, hypertension, hyperlipidemia who presents with abdominal pain.  Patient had a few days of lower abdominal pain that is been constant, moderate, nothing makes it better, nothing makes it worse.  She is had associated constipation with it.  She is not had a bowel movement for the last few days.  Does not sound like any vomiting.  No fevers.        Past Medical History:  Diagnosis Date  . Arthritis   . Chronic kidney disease   . GERD (gastroesophageal reflux disease)   . Hyperlipidemia   . Hypertension     Patient Active Problem List   Diagnosis Date Noted  . Anemia 06/15/2018  . Atherosclerosis of aorta ([place]) 05/16/2018  . Venous insufficiency 08/08/2017  . Lichen simplex chronicus 08/08/2017  . Swelling of limb 05/30/2017  . Left arm pain 08/30/2016  . Bilateral leg cramps 07/06/2016  . Chronic kidney disease, stage 3 07/07/2015  . Cardiac murmur, previously undiagnosed 04/06/2015  . Dementia ([place]) 04/06/2015  . Difficulty hearing 04/06/2015  . OP (osteoporosis) 04/06/2015  . Hypertension 12/23/2014  . GERD (gastroesophageal reflux disease) 12/23/2014  . Adult hypothyroidism 12/23/2014  . Hyperlipidemia 12/23/2014  . Iron deficiency anemia 12/23/2014    Past Surgical History:  Procedure Laterality Date  . EYE SURGERY Right    Cataract    Prior to Admission medications   Medication Sig Start Date End Date Taking? Authorizing Provider  amLODipine (NORVASC) 5 MG tablet TAKE 1 & 1/2 (ONE & ONE-HALF) TABLETS BY MOUTH DAILY 02/08/19   Poulose, Bethel Born, NP  aspirin EC 81 MG tablet Take 81 mg by  mouth daily.    [provider]  atorvastatin (LIPITOR) 10 MG tablet Take 1 tablet by mouth once daily 11/30/18   Poulose, Bethel Born, NP  bisacodyl (DULCOLAX) 10 MG suppository Place 1 suppository (10 mg total) rectally as needed for moderate constipation or severe constipation. 05/15/18   Poulose, Bethel Born, NP  diclofenac sodium (VOLTAREN) 1 % GEL Apply 2 g topically 4 (four) times daily. 03/27/19   Hubbard Hartshorn, FNP  docusate sodium (COLACE) 100 MG capsule Take 1 capsule (100 mg total) by mouth 2 (two) times daily. 05/15/18   Poulose, Bethel Born, NP  Elastic Bandages & Supports (TRUFORM STOCKINGS 20-30MMHG) MISC Wear stockings, put on in the morning and remove at night 08/08/17   Lada, Satira Anis, MD  famotidine (PEPCID) 20 MG tablet Take 1 tablet (20 mg total) by mouth daily. 11/30/18   Poulose, Bethel Born, NP  hydrocortisone (ANUSOL-HC) 2.5 % rectal cream Place 1 application rectally 2 (two) times daily. 08/28/18   Hubbard Hartshorn, FNP  levothyroxine (SYNTHROID) 50 MCG tablet TAKE 1 TABLET BY MOUTH ONCE DAILY BEFORE BREAKFAST 11/30/18   Poulose, Bethel Born, NP  Lidocaine 0.5 % GEL Apply 1 application topically daily. 11/02/17   Poulose, Bethel Born, NP  lisinopril (ZESTRIL) 20 MG tablet Take 1 tablet by mouth once daily 11/30/18   Poulose, Bethel Born, NP  Olopatadine HCl 0.2 % SOLN Apply 1 drop to eye every morning. 05/24/16   Triplett, Dessa Phi, FNP  polyethylene glycol (MIRALAX) packet  Take 17 g by mouth daily. 05/07/18   Jeanmarie Plant, MD  triamcinolone cream (KENALOG) 0.1 % Apply 1 application topically 2 (two) times daily. (skin over the ankles and lower legs) 08/08/17   Lada, Janit Bern, MD  ranitidine (ZANTAC) 150 MG tablet TAKE 1 TABLET BY MOUTH AT BEDTIME FOR STOMACH Patient not taking: Reported on 08/28/2018 02/22/18 11/30/18  Kerman Passey, MD    Allergies Patient has no known allergies.  Family History  Problem Relation Age of Onset  . Stroke Mother   . Healthy Father      Social History Social History   Tobacco Use  . Smoking status: Never Smoker  . Smokeless tobacco: Never Used  . Tobacco comment: smoking cessation materials not required  Substance Use Topics  . Alcohol use: No    Alcohol/week: 0.0 standard drinks  . Drug use: No      Review of Systems Constitutional: No fever/chills Eyes: No visual changes. ENT: No sore throat. Cardiovascular: Denies chest pain. Respiratory: Denies shortness of breath. Gastrointestinal: Positive abdominal pain and constipation Genitourinary: Negative for dysuria. Musculoskeletal: Negative for back pain. Skin: Negative for rash. Neurological: Negative for headaches, focal weakness or numbness. All other ROS negative ____________________________________________   PHYSICAL EXAM:  VITAL SIGNS: Blood pressure (!) 182/72, pulse (!) 52, temperature 97.8 F (36.6 C), temperature source Oral, resp. rate 17, height  (1.727 m), weight 62.7 kg, SpO2 98 %.   Constitutional: Alert and oriented. Well appearing and in no acute distress.  Hard of hearing. Eyes: Conjunctivae are normal. EOMI. Head: Atraumatic. Nose: No congestion/rhinnorhea. Mouth/Throat: Mucous membranes are moist.   Neck: No stridor. Trachea Midline. FROM Cardiovascular: Bradycardic, regular rhythm. Grossly normal heart sounds.  Good peripheral circulation. Respiratory: Normal respiratory effort.  No retractions. Lungs CTAB. Gastrointestinal: Soft but with some tenderness in the lower abdomen.  No distention. No abdominal bruits.  Musculoskeletal: No lower extremity tenderness nor edema.  No joint effusions. Neurologic:  Normal speech and language. No gross focal neurologic deficits are appreciated.  Skin:  Skin is warm, dry and intact. No rash noted. Psychiatric: Mood and affect are normal. Speech and behavior are normal. GU: Deferred   ____________________________________________   LABS (all labs ordered are listed, but only  abnormal results are displayed)  Labs Reviewed  CBC WITH DIFFERENTIAL/PLATELET - Abnormal; Notable for the following components:      Result Value   RBC 3.57 (*)    Hemoglobin 9.1 (*)    HCT 29.6 (*)    MCH 25.5 (*)    All other components within normal limits  COMPREHENSIVE METABOLIC PANEL - Abnormal; Notable for the following components:   GFR calc non Af Amer 54 (*)    All other components within normal limits  URINALYSIS, ROUTINE W REFLEX MICROSCOPIC - Abnormal; Notable for the following components:   Color, Urine STRAW (*)    APPearance CLEAR (*)    All other components within normal limits  LIPASE, BLOOD   ____________________________________________   ED ECG REPORT I, Concha Se, the attending physician, personally viewed and interpreted this ECG.  EKG normal sinus rate of 64, no ST elevation, no T wave inversion, normal intervals ____________________________________________  RADIOLOGY   Official radiology report(s): Ct Abdomen Pelvis W Contrast  Result Date: 04/17/2019 CLINICAL DATA:  Acute generalized lower abdominal pain. No bowel movement for 3 days. EXAM: CT ABDOMEN AND PELVIS WITH CONTRAST TECHNIQUE: Multidetector CT imaging of the abdomen and pelvis was performed using the standard  protocol following bolus administration of intravenous contrast. CONTRAST:  75mL OMNIPAQUE IOHEXOL 300 MG/ML  SOLN COMPARISON:  05/19/2018 FINDINGS: Lower chest: Granulomatous right lower lobe/hilar calcifications. No acute finding Hepatobiliary: No focal liver abnormality.No evidence of biliary obstruction or stone. Pancreas: 26 mm macro cystic mass in the pancreatic tail which has an apparent solid component medially which measures 16 mm. Mild main duct dilatation proximal to the mass, measuring 3 mm in diameter. Dimensions are similar to 2019 CT, in retrospect. Spleen: Unremarkable. Adrenals/Urinary Tract: Negative adrenals. No hydronephrosis or stone. Bilateral simple renal cysts.  Unremarkable bladder. Stomach/Bowel:  No obstruction. No appendicitis. Vascular/Lymphatic: No acute vascular abnormality. Atherosclerosis with ectatic infrarenal aorta measuring up to 24 mm in diameter. No mass or adenopathy. Reproductive:No pathologic findings. Other: No ascites or pneumoperitoneum. Musculoskeletal: No acute abnormalities. Remote L4 compression fracture. Facet degeneration that is advanced at L4-5 where there is grade 1 anterolisthesis. Severe degenerative disc disease at L5-S1 with endplate sclerosis. IMPRESSION: 1. No acute finding. 2. History of no bowel movements but no abnormal stool retention. There is instead fluid levels reaching the distal colon which may be from the history of laxative use. 3. 2.6 cm cystic pancreatic tail mass. There is likely enhancing nodular components, a worrisome finding, but overall dimensions are similar to a noncontrast abdominal CT in 2019. Further workup should be tailored to comorbidities. Electronically Signed   By: Marnee SpringJonathon  Watts M.D.   On: 04/17/2019 09:46    ____________________________________________   PROCEDURES  Procedure(s) performed (including Critical Care):  Procedures   ____________________________________________   INITIAL IMPRESSION / ASSESSMENT AND PLAN / ED COURSE  Madelynn DoneMollie J Decoursey was evaluated in Emergency Department on 04/17/2019 for the symptoms described in the history of present illness. She was evaluated in the context of the global COVID-19 pandemic, which necessitated consideration that the patient might be at risk for infection with the SARS-CoV-2 virus that causes COVID-19. Institutional protocols and algorithms that pertain to the evaluation of patients at risk for COVID-19 are in a state of rapid change based on information released by regulatory bodies including the CDC and federal and state organizations. These policies and algorithms were followed during the patient's care in the ED.    Patient is a pleasant  85 year old who is very hard of hearing but reports abdominal pain and constipation.  Somewhat difficult to get a full history of the pain due to patient being extremely hard of hearing but it sounds that this been gone for past few days associate with constipation.  Normal exam is pretty reassuring but given age will get CT scan to ensure no evidence of diverticulitis, obstruction, perforation and to evaluate for constipation.  Will get basic labs to evaluate for electrolyte abnormalities or AKI.  She denies any upper abdominal pain or chest pain distress ACS.  She denies any shortness of breath.   Pt having right hip pain but able to bear weight on it.    CT scan is negative for acute process.  Does show question of a pancreatic mass that is similar to 2019.  No significant stool noted on CT.  I also discussed with Dr. Claudette LawsWatson there is no evidence of pelvic fracture on the right.  Patient is able to bear weight.  Hemoglobin is at baseline 9.1.  7 months ago it was 9.5.  Kidney function is at normal baseline.  10:54 AM reevaluated patient doing well.  Patient has no abdominal pain at this time.  Discussed with patient's son about the  negative work-up.  He is going to come and pick her up and take her back home.  He denies any other concerns at this time.  I discussed the provisional nature of ED diagnosis, the treatment so far, the ongoing plan of care, follow up appointments and return precautions with the patient and any family or support people present. They expressed understanding and agreed with the plan, discharged home.   ____________________________________________   FINAL CLINICAL IMPRESSION(S) / ED DIAGNOSES   Final diagnoses:  Lower abdominal pain      MEDICATIONS GIVEN DURING THIS VISIT:  Medications  iohexol (OMNIPAQUE) 300 MG/ML solution 75 mL (75 mLs Intravenous Contrast Given 04/17/19 0919)     ED Discharge Orders    None       Note:  This document was  prepared using Dragon voice recognition software and may include unintentional dictation errors.   Concha Se, MD 04/17/19 [phone]

## 2019-04-17 NOTE — ED Notes (Signed)
PT and family member educated for pt to take regular bp meds when home. Pt and family verbalise understanding.

## 2019-04-25 ENCOUNTER — Emergency Department: Payer: Medicare HMO

## 2019-04-25 ENCOUNTER — Other Ambulatory Visit: Payer: Self-pay

## 2019-04-25 ENCOUNTER — Encounter: Payer: Self-pay | Admitting: Emergency Medicine

## 2019-04-25 ENCOUNTER — Emergency Department
Admission: EM | Admit: 2019-04-25 | Discharge: 2019-04-25 | Disposition: A | Discharge status: Home / Self Care | Payer: Medicare HMO | Attending: Emergency Medicine | Admitting: Emergency Medicine

## 2019-04-25 DIAGNOSIS — Z79899 Other long term (current) drug therapy: Secondary | ICD-10-CM | POA: Insufficient documentation

## 2019-04-25 DIAGNOSIS — Y999 Unspecified external cause status: Secondary | ICD-10-CM | POA: Insufficient documentation

## 2019-04-25 DIAGNOSIS — W0110XA Fall on same level from slipping, tripping and stumbling with subsequent striking against unspecified object, initial encounter: Secondary | ICD-10-CM | POA: Insufficient documentation

## 2019-04-25 DIAGNOSIS — I10 Essential (primary) hypertension: Secondary | ICD-10-CM | POA: Diagnosis not present

## 2019-04-25 DIAGNOSIS — E039 Hypothyroidism, unspecified: Secondary | ICD-10-CM | POA: Insufficient documentation

## 2019-04-25 DIAGNOSIS — S0990XA Unspecified injury of head, initial encounter: Secondary | ICD-10-CM | POA: Diagnosis not present

## 2019-04-25 DIAGNOSIS — Y9389 Activity, other specified: Secondary | ICD-10-CM | POA: Diagnosis not present

## 2019-04-25 DIAGNOSIS — M25512 Pain in left shoulder: Secondary | ICD-10-CM | POA: Diagnosis not present

## 2019-04-25 DIAGNOSIS — S199XXA Unspecified injury of neck, initial encounter: Secondary | ICD-10-CM | POA: Diagnosis not present

## 2019-04-25 DIAGNOSIS — I129 Hypertensive chronic kidney disease with stage 1 through stage 4 chronic kidney disease, or unspecified chronic kidney disease: Secondary | ICD-10-CM | POA: Diagnosis not present

## 2019-04-25 DIAGNOSIS — Z7982 Long term (current) use of aspirin: Secondary | ICD-10-CM | POA: Diagnosis not present

## 2019-04-25 DIAGNOSIS — Y929 Unspecified place or not applicable: Secondary | ICD-10-CM | POA: Insufficient documentation

## 2019-04-25 DIAGNOSIS — S0083XA Contusion of other part of head, initial encounter: Secondary | ICD-10-CM | POA: Diagnosis not present

## 2019-04-25 DIAGNOSIS — S99912A Unspecified injury of left ankle, initial encounter: Secondary | ICD-10-CM | POA: Diagnosis not present

## 2019-04-25 DIAGNOSIS — R519 Headache, unspecified: Secondary | ICD-10-CM | POA: Insufficient documentation

## 2019-04-25 DIAGNOSIS — G501 Atypical facial pain: Secondary | ICD-10-CM | POA: Insufficient documentation

## 2019-04-25 DIAGNOSIS — N183 Chronic kidney disease, stage 3 unspecified: Secondary | ICD-10-CM | POA: Diagnosis not present

## 2019-04-25 DIAGNOSIS — S4992XA Unspecified injury of left shoulder and upper arm, initial encounter: Secondary | ICD-10-CM | POA: Diagnosis not present

## 2019-04-25 DIAGNOSIS — M25572 Pain in left ankle and joints of left foot: Secondary | ICD-10-CM | POA: Diagnosis not present

## 2019-04-25 DIAGNOSIS — W19XXXA Unspecified fall, initial encounter: Secondary | ICD-10-CM

## 2019-04-25 NOTE — ED Notes (Signed)
Patient transported to X-ray 

## 2019-04-25 NOTE — ED Notes (Signed)
Pt up to bathroom with assistance , steady gait noted

## 2019-04-25 NOTE — ED Notes (Signed)
Dr Jessup at bedside 

## 2019-04-25 NOTE — ED Provider Notes (Signed)
[facility] Emergency Department Provider Note   ____________________________________________   First MD Initiated Contact with Patient 04/25/19 1315     (approximate)  I have reviewed the triage vital signs and the nursing notes.   HISTORY  Chief Complaint Fall    HPI Madelynn DoneMollie J Nisley is a 85 y.o. female with past medical history of hypertension hyperlipidemia who presents to the ED following fall.  Patient reports that she was walking when her shoe got stuck on the carpet and she tripped and fell.  She reports hitting her right forehead but denies losing consciousness.  She now complains of a headache and pain across her forehead as well as pain at her left ankle.  She denies any neck pain, chest pain, abdominal pain, or hip pain.  She denies any changes in her vision, speech, numbness, or weakness.  She is not anticoagulated.        Past Medical History:  Diagnosis Date  . Arthritis   . Chronic kidney disease   . GERD (gastroesophageal reflux disease)   . Hyperlipidemia   . Hypertension     Patient Active Problem List   Diagnosis Date Noted  . Anemia 06/15/2018  . Atherosclerosis of aorta (HCC) 05/16/2018  . Venous insufficiency 08/08/2017  . Lichen simplex chronicus 08/08/2017  . Swelling of limb 05/30/2017  . Left arm pain 08/30/2016  . Bilateral leg cramps 07/06/2016  . Chronic kidney disease, stage 3 07/07/2015  . Cardiac murmur, previously undiagnosed 04/06/2015  . Dementia (HCC) 04/06/2015  . Difficulty hearing 04/06/2015  . OP (osteoporosis) 04/06/2015  . Hypertension 12/23/2014  . GERD (gastroesophageal reflux disease) 12/23/2014  . Adult hypothyroidism 12/23/2014  . Hyperlipidemia 12/23/2014  . Iron deficiency anemia 12/23/2014    Past Surgical History:  Procedure Laterality Date  . EYE SURGERY Right    Cataract    Prior to Admission medications   Medication Sig Start Date End Date Taking? Authorizing Provider   amLODipine (NORVASC) 5 MG tablet TAKE 1 & 1/2 (ONE & ONE-HALF) TABLETS BY MOUTH DAILY 02/08/19   Poulose, Percell BeltElizabeth E, NP  aspirin EC 81 MG tablet Take 81 mg by mouth daily.    [provider]  atorvastatin (LIPITOR) 10 MG tablet Take 1 tablet by mouth once daily 11/30/18   Poulose, Percell BeltElizabeth E, NP  bisacodyl (DULCOLAX) 10 MG suppository Place 1 suppository (10 mg total) rectally as needed for moderate constipation or severe constipation. 05/15/18   Poulose, Percell BeltElizabeth E, NP  diclofenac sodium (VOLTAREN) 1 % GEL Apply 2 g topically 4 (four) times daily. 03/27/19   Doren CustardBoyce, Emily E, FNP  docusate sodium (COLACE) 100 MG capsule Take 1 capsule (100 mg total) by mouth 2 (two) times daily. 05/15/18   Poulose, Percell BeltElizabeth E, NP  Elastic Bandages & Supports (TRUFORM STOCKINGS 20-30MMHG) MISC Wear stockings, put on in the morning and remove at night 08/08/17   Lada, Janit BernMelinda P, MD  famotidine (PEPCID) 20 MG tablet Take 1 tablet (20 mg total) by mouth daily. 11/30/18   Poulose, Percell BeltElizabeth E, NP  hydrocortisone (ANUSOL-HC) 2.5 % rectal cream Place 1 application rectally 2 (two) times daily. 08/28/18   Doren CustardBoyce, Emily E, FNP  levothyroxine (SYNTHROID) 50 MCG tablet TAKE 1 TABLET BY MOUTH ONCE DAILY BEFORE BREAKFAST 11/30/18   Poulose, Percell BeltElizabeth E, NP  Lidocaine 0.5 % GEL Apply 1 application topically daily. 11/02/17   Poulose, Percell BeltElizabeth E, NP  lisinopril (ZESTRIL) 20 MG tablet Take 1 tablet by mouth once daily 11/30/18  Poulose, Percell Belt, NP  Olopatadine HCl 0.2 % SOLN Apply 1 drop to eye every morning. 05/24/16   Triplett, Rulon Eisenmenger B, FNP  polyethylene glycol (MIRALAX) packet Take 17 g by mouth daily. 05/07/18   Jeanmarie Plant, MD  triamcinolone cream (KENALOG) 0.1 % Apply 1 application topically 2 (two) times daily. (skin over the ankles and lower legs) 08/08/17   Lada, Janit Bern, MD  ranitidine (ZANTAC) 150 MG tablet TAKE 1 TABLET BY MOUTH AT BEDTIME FOR STOMACH Patient not taking: Reported on 08/28/2018 02/22/18  11/30/18  Kerman Passey, MD    Allergies Patient has no known allergies.  Family History  Problem Relation Age of Onset  . Stroke Mother   . Healthy Father     Social History Social History   Tobacco Use  . Smoking status: Never Smoker  . Smokeless tobacco: Never Used  . Tobacco comment: smoking cessation materials not required  Substance Use Topics  . Alcohol use: No    Alcohol/week: 0.0 standard drinks  . Drug use: No    Review of Systems  Constitutional: No fever/chills Eyes: No visual changes. ENT: No sore throat. Cardiovascular: Denies chest pain. Respiratory: Denies shortness of breath. Gastrointestinal: No abdominal pain.  No nausea, no vomiting.  No diarrhea.  No constipation. Genitourinary: Negative for dysuria. Musculoskeletal: Negative for back pain.  Positive for left ankle pain and shoulder pain. Skin: Negative for rash. Neurological: Positive for headaches, negative for focal weakness or numbness.  ____________________________________________   PHYSICAL EXAM:  VITAL SIGNS: ED Triage Vitals [04/25/19 1315]  Enc Vitals Group     BP (!) 180/85     Pulse Rate 67     Resp 16     Temp      Temp src      SpO2 99 %     Weight      Height      Head Circumference      Peak Flow      Pain Score 3     Pain Loc      Pain Edu?      Excl. in [place]?     Constitutional: Alert and oriented. Eyes: Conjunctivae are normal. Head: Atraumatic. Nose: No congestion/rhinnorhea. Mouth/Throat: Mucous membranes are moist. Neck: Normal ROM Cardiovascular: Normal rate, regular rhythm. Grossly normal heart sounds. Respiratory: Normal respiratory effort.  No retractions. Lungs CTAB. Gastrointestinal: Soft and nontender. No distention. Genitourinary: deferred Musculoskeletal: No lower extremity tenderness nor edema.  Diffuse tenderness to left shoulder and left ankle, otherwise no extremity tenderness and pelvis stable. Neurologic:  Normal speech and language. No  gross focal neurologic deficits are appreciated. Skin:  Skin is warm, dry and intact. No rash noted. Psychiatric: Mood and affect are normal. Speech and behavior are normal.  ____________________________________________   LABS (all labs ordered are listed, but only abnormal results are displayed)  Labs Reviewed - No data to display   PROCEDURES  Procedure(s) performed (including Critical Care):  Procedures   ____________________________________________   INITIAL IMPRESSION / ASSESSMENT AND PLAN / ED COURSE       85 year old female presents to the ED after mechanical fall onto the ground, striking her head but not losing consciousness.  She has a benign and nonfocal neurologic exam, only noteworthy findings are diffuse tenderness to left shoulder and ankle.  CT head and C-spine are negative for acute process and plain films of left shoulder and ankle are also negative.  Patient is appropriate for discharge back to nursing  facility, counseled to return to the ED for new or worsening symptoms.  Patient agrees with plan.      ____________________________________________   FINAL CLINICAL IMPRESSION(S) / ED DIAGNOSES  Final diagnoses:  Fall, initial encounter  Contusion of forehead, initial encounter     ED Discharge Orders    None       Note:  This document was prepared using Dragon voice recognition software and may include unintentional dictation errors.   Blake Divine, MD 04/25/19 1535

## 2019-04-25 NOTE — ED Triage Notes (Signed)
Pt via EMS from [facility]. PT states she tripped on carpet, denies any LOC. PT has contusion noted to forehead, denies any anticoag use. Pt also c/o RT knee pain. No deformity noted. VS

## 2019-04-25 NOTE — ED Notes (Signed)
Pt visitor at bedside.

## 2019-05-14 ENCOUNTER — Other Ambulatory Visit: Payer: Self-pay

## 2019-05-14 DIAGNOSIS — E7800 Pure hypercholesterolemia, unspecified: Secondary | ICD-10-CM

## 2019-05-14 NOTE — Telephone Encounter (Signed)
90 day per ins 

## 2019-05-15 MED ORDER — ATORVASTATIN CALCIUM 10 MG PO TABS: 10.0000 mg | ORAL_TABLET | Freq: Every day | ORAL | 1 refills | Status: DC

## 2019-05-20 ENCOUNTER — Other Ambulatory Visit: Payer: Self-pay

## 2019-05-20 ENCOUNTER — Encounter: Payer: Self-pay | Admitting: Emergency Medicine

## 2019-05-20 ENCOUNTER — Emergency Department
Admission: EM | Admit: 2019-05-20 | Discharge: 2019-05-20 | Disposition: A | Discharge status: Home / Self Care | Payer: Medicare HMO | Attending: Emergency Medicine | Admitting: Emergency Medicine

## 2019-05-20 DIAGNOSIS — R21 Rash and other nonspecific skin eruption: Secondary | ICD-10-CM | POA: Diagnosis present

## 2019-05-20 DIAGNOSIS — F0390 Unspecified dementia without behavioral disturbance: Secondary | ICD-10-CM | POA: Insufficient documentation

## 2019-05-20 DIAGNOSIS — L280 Lichen simplex chronicus: Secondary | ICD-10-CM | POA: Insufficient documentation

## 2019-05-20 DIAGNOSIS — E039 Hypothyroidism, unspecified: Secondary | ICD-10-CM | POA: Diagnosis not present

## 2019-05-20 DIAGNOSIS — Z7982 Long term (current) use of aspirin: Secondary | ICD-10-CM | POA: Insufficient documentation

## 2019-05-20 DIAGNOSIS — I129 Hypertensive chronic kidney disease with stage 1 through stage 4 chronic kidney disease, or unspecified chronic kidney disease: Secondary | ICD-10-CM | POA: Insufficient documentation

## 2019-05-20 DIAGNOSIS — Z79899 Other long term (current) drug therapy: Secondary | ICD-10-CM | POA: Diagnosis not present

## 2019-05-20 DIAGNOSIS — N183 Chronic kidney disease, stage 3 unspecified: Secondary | ICD-10-CM | POA: Diagnosis not present

## 2019-05-20 LAB — COMPREHENSIVE METABOLIC PANEL
ALT: 11 U/L (ref 0–44)
AST: 23 U/L (ref 15–41)
Albumin: 3.8 g/dL (ref 3.5–5.0)
Alkaline Phosphatase: 48 U/L (ref 38–126)
Anion gap: 8 (ref 5–15)
BUN: 9 mg/dL (ref 8–23)
CO2: 25 mmol/L (ref 22–32)
Calcium: 8.9 mg/dL (ref 8.9–10.3)
Chloride: 109 mmol/L (ref 98–111)
Creatinine, Ser: 0.85 mg/dL (ref 0.44–1.00)
GFR calc Af Amer: >60 mL/min (ref >60)
GFR calc non Af Amer: 57 mL/min — ABNORMAL LOW (ref >60)
Glucose, Bld: 81 mg/dL (ref 70–99)
Potassium: 3.2 mmol/L — ABNORMAL LOW (ref 3.5–5.1)
Sodium: 142 mmol/L (ref 135–145)
Total Bilirubin: 0.5 mg/dL (ref 0.3–1.2)
Total Protein: 6.4 g/dL — ABNORMAL LOW (ref 6.5–8.1)

## 2019-05-20 LAB — CBC WITH DIFFERENTIAL/PLATELET
Abs Immature Granulocytes: 0.02 10*3/uL (ref 0.00–0.07)
Basophils Absolute: 0 10*3/uL (ref 0.0–0.1)
Basophils Relative: 1 %
Eosinophils Absolute: 0.1 10*3/uL (ref 0.0–0.5)
Eosinophils Relative: 2 %
HCT: 28.3 % — ABNORMAL LOW (ref 36.0–46.0)
Hemoglobin: 9 g/dL — ABNORMAL LOW (ref 12.0–15.0)
Immature Granulocytes: 0 %
Lymphocytes Relative: 25 %
Lymphs Abs: 1.4 10*3/uL (ref 0.7–4.0)
MCH: 25.3 pg — ABNORMAL LOW (ref 26.0–34.0)
MCHC: 31.8 g/dL (ref 30.0–36.0)
MCV: 79.5 fL — ABNORMAL LOW (ref 80.0–100.0)
Monocytes Absolute: 0.3 10*3/uL (ref 0.1–1.0)
Monocytes Relative: 6 %
Neutro Abs: 3.5 10*3/uL (ref 1.7–7.7)
Neutrophils Relative %: 66 %
Platelets: 233 10*3/uL (ref 150–400)
RBC: 3.56 MIL/uL — ABNORMAL LOW (ref 3.87–5.11)
RDW: 14.6 % (ref 11.5–15.5)
WBC: 5.4 10*3/uL (ref 4.0–10.5)
nRBC: 0 % (ref 0.0–0.2)

## 2019-05-20 MED ORDER — HYDROCORTISONE 1 % EX CREA
TOPICAL_CREAM | Freq: Two times a day (BID) | CUTANEOUS | Status: DC
  Administered 2019-05-20: 1 via TOPICAL
  Filled 2019-05-20: qty 28

## 2019-05-20 MED ORDER — HYDROCORTISONE 0.5 % EX CREA: 1.0000 "application " | TOPICAL_CREAM | Freq: Two times a day (BID) | CUTANEOUS | 0 refills | Status: DC

## 2019-05-20 NOTE — ED Triage Notes (Signed)
Pt reports rash and pain to lower legs for the past couple of months. Pt states it seems to be getting worse so she wanted it checked out. Pt reports it itches around her ankles.

## 2019-05-20 NOTE — ED Provider Notes (Signed)
[facility] Emergency Department Provider Note _________________   First MD Initiated Contact with Patient 05/20/19 1203     (approximate)  I have reviewed the triage vital signs and the nursing notes.   HISTORY  Chief Complaint Leg Pain and Rash    HPI Karen Davila is a 85 y.o. female with below list of previous medical conditions presents to the emergency department secondary to bilateral lower extremity pruritic rash around the ankles that is occasionally painful for the past "couple of months".  Patient states that this white applying lotion to the area it is "so dry and itchy".  Patient denies any lower extremity swelling.        Past Medical History:  Diagnosis Date  . Arthritis   . Chronic kidney disease   . GERD (gastroesophageal reflux disease)   . Hyperlipidemia   . Hypertension     Patient Active Problem List   Diagnosis Date Noted  . Anemia 06/15/2018  . Atherosclerosis of aorta (HCC) 05/16/2018  . Venous insufficiency 08/08/2017  . Lichen simplex chronicus 08/08/2017  . Swelling of limb 05/30/2017  . Left arm pain 08/30/2016  . Bilateral leg cramps 07/06/2016  . Chronic kidney disease, stage 3 07/07/2015  . Cardiac murmur, previously undiagnosed 04/06/2015  . Dementia (HCC) 04/06/2015  . Difficulty hearing 04/06/2015  . OP (osteoporosis) 04/06/2015  . Hypertension 12/23/2014  . GERD (gastroesophageal reflux disease) 12/23/2014  . Adult hypothyroidism 12/23/2014  . Hyperlipidemia 12/23/2014  . Iron deficiency anemia 12/23/2014    Past Surgical History:  Procedure Laterality Date  . EYE SURGERY Right    Cataract    Prior to Admission medications   Medication Sig Start Date End Date Taking? Authorizing Provider  amLODipine (NORVASC) 5 MG tablet TAKE 1 & 1/2 (ONE & ONE-HALF) TABLETS BY MOUTH DAILY 02/08/19   Poulose, Percell Belt, NP  aspirin EC 81 MG tablet Take 81 mg by mouth daily.    [provider]   atorvastatin (LIPITOR) 10 MG tablet Take 1 tablet (10 mg total) by mouth daily. 05/15/19   [name], PA-C  bisacodyl (DULCOLAX) 10 MG suppository Place 1 suppository (10 mg total) rectally as needed for moderate constipation or severe constipation. 05/15/18   Poulose, Percell Belt, NP  diclofenac sodium (VOLTAREN) 1 % GEL Apply 2 g topically 4 (four) times daily. 03/27/19   Doren Custard, FNP  docusate sodium (COLACE) 100 MG capsule Take 1 capsule (100 mg total) by mouth 2 (two) times daily. 05/15/18   Poulose, Percell Belt, NP  Elastic Bandages & Supports (TRUFORM STOCKINGS 20-30MMHG) MISC Wear stockings, put on in the morning and remove at night 08/08/17   Lada, Janit Bern, MD  famotidine (PEPCID) 20 MG tablet Take 1 tablet (20 mg total) by mouth daily. 11/30/18   Poulose, Percell Belt, NP  hydrocortisone (ANUSOL-HC) 2.5 % rectal cream Place 1 application rectally 2 (two) times daily. 08/28/18   Doren Custard, FNP  levothyroxine (SYNTHROID) 50 MCG tablet TAKE 1 TABLET BY MOUTH ONCE DAILY BEFORE BREAKFAST 11/30/18   Poulose, Percell Belt, NP  Lidocaine 0.5 % GEL Apply 1 application topically daily. 11/02/17   Poulose, Percell Belt, NP  lisinopril (ZESTRIL) 20 MG tablet Take 1 tablet by mouth once daily 11/30/18   Poulose, Percell Belt, NP  Olopatadine HCl 0.2 % SOLN Apply 1 drop to eye every morning. 05/24/16   Triplett, Rulon Eisenmenger B, FNP  polyethylene glycol (MIRALAX) packet Take 17 g by mouth daily. 05/07/18  Schuyler Amor, MD  triamcinolone cream (KENALOG) 0.1 % Apply 1 application topically 2 (two) times daily. (skin over the ankles and lower legs) 08/08/17   Lada, Satira Anis, MD  ranitidine (ZANTAC) 150 MG tablet TAKE 1 TABLET BY MOUTH AT BEDTIME FOR STOMACH Patient not taking: Reported on 08/28/2018 02/22/18 11/30/18  Arnetha Courser, MD    Allergies Patient has no known allergies.  Family History  Problem Relation Age of Onset  . Stroke Mother   . Healthy Father     Social History Social History    Tobacco Use  . Smoking status: Never Smoker  . Smokeless tobacco: Never Used  . Tobacco comment: smoking cessation materials not required  Substance Use Topics  . Alcohol use: No    Alcohol/week: 0.0 standard drinks  . Drug use: No    Review of Systems Constitutional: No fever/chills Eyes: No visual changes. ENT: No sore throat. Cardiovascular: Denies chest pain. Respiratory: Denies shortness of breath. Gastrointestinal: No abdominal pain.  No nausea, no vomiting.  No diarrhea.  No constipation. Genitourinary: Negative for dysuria. Musculoskeletal: Negative for neck pain.  Negative for back pain. Integumentary: Positive for bilateral lower extremity rash Neurological: Negative for headaches, focal weakness or numbness.  ____________________________________________   PHYSICAL EXAM:  VITAL SIGNS: ED Triage Vitals  Enc Vitals Group     BP 05/20/19 0946 (!) 179/62     Pulse Rate 05/20/19 0946 66     Resp 05/20/19 0946 18     Temp 05/20/19 0946 97.6 F (36.4 C)     Temp Source 05/20/19 0946 Oral     SpO2 05/20/19 0946 100 %     Weight 05/20/19 0943 63.5 kg (140 lb)     Height 05/20/19 0943 1.626 m (5\' 4" )     Head Circumference --      Peak Flow --      Pain Score 05/20/19 0943 0     Pain Loc --      Pain Edu? --      Excl. in [place]? --     Constitutional: Alert and oriented.  Eyes: Conjunctivae are normal.  Neck: No stridor.  No meningeal signs.   Cardiovascular: Normal rate, regular rhythm. Good peripheral circulation. Grossly normal heart sounds. Respiratory: Normal respiratory effort.  No retractions. Gastrointestinal: Soft and nontender. No distention.  Musculoskeletal: No lower extremity tenderness nor edema. No gross deformities of extremities. Neurologic:  Normal speech and language. No gross focal neurologic deficits are appreciated.  Skin:  lichenification of the skin around the ankles with associated excoriations Psychiatric: Mood and affect are normal.  Speech and behavior are normal.    Procedures   ____________________________________________   INITIAL IMPRESSION / MDM / [place] / ED COURSE  As part of my medical decision making, I reviewed the following data within the electronic MEDICAL RECORD NUMBER 85 year old female presented with above-stated history and physical exam consistent with lichen simplex chronicus.  Hydrocortisone will be applied to bilateral ankle in the area of lichenification and excoriation.  ____________________________________________  FINAL CLINICAL IMPRESSION(S) / ED DIAGNOSES  Final diagnoses:  Lichen simplex chronicus     MEDICATIONS GIVEN DURING THIS VISIT:  Medications  hydrocortisone cream 1 % (has no administration in time range)     ED Discharge Orders    None      *Please note:  Karen Davila was evaluated in Emergency Department on 05/20/2019 for the symptoms described in the history of present illness. She was  evaluated in the context of the global COVID-19 pandemic, which necessitated consideration that the patient might be at risk for infection with the SARS-CoV-2 virus that causes COVID-19. Institutional protocols and algorithms that pertain to the evaluation of patients at risk for COVID-19 are in a state of rapid change based on information released by regulatory bodies including the CDC and federal and state organizations. These policies and algorithms were followed during the patient's care in the ED.  Some ED evaluations and interventions may be delayed as a result of limited staffing during the pandemic.*  Note:  This document was prepared using Dragon voice recognition software and may include unintentional dictation errors.   Darci CurrentBrown, Manvel N, MD 05/20/19 1218

## 2019-05-20 NOTE — ED Notes (Signed)
Pt husband phone number is [phone] to call when she is ready to be picked up.

## 2019-05-27 ENCOUNTER — Other Ambulatory Visit: Payer: Self-pay

## 2019-05-27 DIAGNOSIS — I10 Essential (primary) hypertension: Secondary | ICD-10-CM

## 2019-05-27 MED ORDER — LISINOPRIL 20 MG PO TABS: 20.0000 mg | ORAL_TABLET | Freq: Every day | ORAL | 0 refills | Status: DC

## 2019-06-05 ENCOUNTER — Telehealth: Payer: Self-pay | Admitting: Family Medicine

## 2019-06-05 DIAGNOSIS — E038 Other specified hypothyroidism: Secondary | ICD-10-CM

## 2019-06-05 NOTE — Telephone Encounter (Signed)
Please schedule virtual visit with Leisa or myself in the next 30 days.  I do recommend repeat labs when able to come in, however, with the COVID-19 pandemic surge ocurring, and patient's advanced age, it is safest to wait for in-person labs at this time.

## 2019-06-27 ENCOUNTER — Ambulatory Visit: Payer: Medicare HMO | Admitting: Family Medicine

## 2019-07-09 ENCOUNTER — Other Ambulatory Visit: Payer: Self-pay | Admitting: Family Medicine

## 2019-07-09 DIAGNOSIS — E038 Other specified hypothyroidism: Secondary | ICD-10-CM

## 2019-07-31 ENCOUNTER — Ambulatory Visit: Payer: Medicare HMO | Admitting: Family Medicine

## 2019-08-14 ENCOUNTER — Encounter: Payer: Self-pay | Admitting: Internal Medicine

## 2019-08-14 ENCOUNTER — Other Ambulatory Visit: Payer: Self-pay

## 2019-08-14 ENCOUNTER — Ambulatory Visit (INDEPENDENT_AMBULATORY_CARE_PROVIDER_SITE_OTHER): Payer: Medicare HMO | Admitting: Internal Medicine

## 2019-08-14 VITALS — HR 71 | Temp 97.6°F | Resp 16 | Ht 63.0 in | Wt 133.5 lb

## 2019-08-14 DIAGNOSIS — H9190 Unspecified hearing loss, unspecified ear: Secondary | ICD-10-CM | POA: Diagnosis not present

## 2019-08-14 DIAGNOSIS — M810 Age-related osteoporosis without current pathological fracture: Secondary | ICD-10-CM | POA: Diagnosis not present

## 2019-08-14 DIAGNOSIS — M25551 Pain in right hip: Secondary | ICD-10-CM | POA: Diagnosis not present

## 2019-08-14 DIAGNOSIS — M7061 Trochanteric bursitis, right hip: Secondary | ICD-10-CM

## 2019-08-14 NOTE — Patient Instructions (Signed)
Use tylenol as needed for your hip pain as you are doing presently.  Can apply some cold to area for brief intervals (5-10 minutes) as needed also to help   Modify activities some in the short term to help, limiting activities that make it more painful.  Follow-up if is not improving or more problematic as we discussed today.

## 2019-08-14 NOTE — Progress Notes (Signed)
Patient ID: Karen Davila, female    DOB: 08/20/1917, 86 y.o.   MRN: [id number]  PCP: Delsa Grana, PA-C  Chief Complaint  Patient presents with  . Hip Pain    Right hip pain, pain is off and on    Subjective:   Karen Davila is a 86 y.o. female, presents to clinic with CC of the following:  Chief Complaint  Patient presents with  . Hip Pain    Right hip pain, pain is off and on    HPI:  Patient is a 86 year old female who was last seen in the emergency room on 05/20/2019 and diagnosed with lichen simplex chronicus, with application of hydrocortisone recommended topically. That note was reviewed.  She presents today with some intermittent right hip pains.  She notes it does not hurt all the time, comes and goes.  Not hurting right now.  She had no trauma before this came on, and denied any recent falls at home.  The pain does not cause her to lose her balance or limit her activity right now.  Does not keep her up at night.  Denies any history of hip fractures or hip problems in her past.  She feels it on the outside of her hip, and sometimes into her thigh area.  She denies any numbness or tingling in the leg.  No weakness, She has taken Tylenol for the pain, and states that does help, "eases up ".  She mentions she takes a pain pill, and I believe that is the Tylenol when attempting to clarify.  She did not bring her medicines with her to review, do have the list of medicines on epic.  She is hard of hearing, making the history somewhat challenging at times. She uses a cane to ambulate to lessen fall risk. She lives alone, and states she does not need help now as she cooks and cleans on her own.  She does get help when she needs to go out  Tob - never smoker  Patient Active Problem List   Diagnosis Date Noted  . Anemia 06/15/2018  . Atherosclerosis of aorta ([place]) 05/16/2018  . Venous insufficiency 08/08/2017  . Lichen simplex chronicus 08/08/2017  . Swelling of limb 05/30/2017    . Left arm pain 08/30/2016  . Bilateral leg cramps 07/06/2016  . Chronic kidney disease, stage 3 07/07/2015  . Cardiac murmur, previously undiagnosed 04/06/2015  . Dementia ([place]) 04/06/2015  . Difficulty hearing 04/06/2015  . OP (osteoporosis) 04/06/2015  . Hypertension 12/23/2014  . GERD (gastroesophageal reflux disease) 12/23/2014  . Adult hypothyroidism 12/23/2014  . Hyperlipidemia 12/23/2014  . Iron deficiency anemia 12/23/2014      Current Outpatient Medications:  .  amLODipine (NORVASC) 5 MG tablet, TAKE 1 & 1/2 (ONE & ONE-HALF) TABLETS BY MOUTH DAILY, Disp: 135 tablet, Rfl: 0 .  aspirin EC 81 MG tablet, Take 81 mg by mouth daily., Disp: , Rfl:  .  atorvastatin (LIPITOR) 10 MG tablet, Take 1 tablet (10 mg total) by mouth daily., Disp: 90 tablet, Rfl: 1 .  bisacodyl (DULCOLAX) 10 MG suppository, Place 1 suppository (10 mg total) rectally as needed for moderate constipation or severe constipation., Disp: 10 suppository, Rfl: 0 .  diclofenac sodium (VOLTAREN) 1 % GEL, Apply 2 g topically 4 (four) times daily., Disp: 150 g, Rfl: 1 .  docusate sodium (COLACE) 100 MG capsule, Take 1 capsule (100 mg total) by mouth 2 (two) times daily., Disp: 60 capsule, Rfl: 2 .  Elastic Bandages & Supports (TRUFORM STOCKINGS 20-30MMHG) MISC, Wear stockings, put on in the morning and remove at night, Disp: 2 each, Rfl: 1 .  famotidine (PEPCID) 20 MG tablet, Take 1 tablet (20 mg total) by mouth daily., Disp: 90 tablet, Rfl: 0 .  hydrocortisone (ANUSOL-HC) 2.5 % rectal cream, Place 1 application rectally 2 (two) times daily., Disp: 30 g, Rfl: 0 .  hydrocortisone cream 0.5 %, Apply 1 application topically 2 (two) times daily., Disp: 30 g, Rfl: 0 .  levothyroxine (SYNTHROID) 50 MCG tablet, TAKE 1 TABLET BY MOUTH ONCE DAILY BEFORE BREAKFAST, Disp: 90 tablet, Rfl: 0 .  Lidocaine 0.5 % GEL, Apply 1 application topically daily., Disp: 1 Tube, Rfl: 0 .  lisinopril (ZESTRIL) 20 MG tablet, Take 1 tablet (20 mg  total) by mouth daily., Disp: 90 tablet, Rfl: 0 .  Olopatadine HCl 0.2 % SOLN, Apply 1 drop to eye every morning., Disp: 1 Bottle, Rfl: 0 .  polyethylene glycol (MIRALAX) packet, Take 17 g by mouth daily., Disp: 14 each, Rfl: 0 .  triamcinolone cream (KENALOG) 0.1 %, Apply 1 application topically 2 (two) times daily. (skin over the ankles and lower legs), Disp: 80 g, Rfl: 1   No Known Allergies   Past Surgical History:  Procedure Laterality Date  . EYE SURGERY Right    Cataract     Family History  Problem Relation Age of Onset  . Stroke Mother   . Healthy Father      Social History   Tobacco Use  . Smoking status: Never Smoker  . Smokeless tobacco: Never Used  . Tobacco comment: smoking cessation materials not required  Substance Use Topics  . Alcohol use: No    Alcohol/week: 0.0 standard drinks    With staff assistance, above reviewed with the patient today.  ROS: As per HPI, otherwise no specific complaints on a limited and focused system review   PHQ2/9: Depression screen [facility] 2/9 08/14/2019 04/11/2019 03/27/2019 08/28/2018 06/15/2018  Decreased Interest 0 0 0 0 0  Down, Depressed, Hopeless 0 0 0 0 0  PHQ - 2 Score 0 0 0 0 0  Altered sleeping 0 0 0 0 0  Tired, decreased energy 0 0 0 0 0  Change in appetite 0 0 0 0 0  Feeling bad or failure about yourself  0 0 0 0 0  Trouble concentrating 0 0 0 0 0  Moving slowly or fidgety/restless 0 0 0 0 0  Suicidal thoughts 0 0 0 0 0  PHQ-9 Score 0 0 0 0 0  Difficult doing work/chores Not difficult at all Not difficult at all Not difficult at all Not difficult at all Not difficult at all  Some recent data might be hidden   PHQ-2/9 Result is neg  Fall Risk: Fall Risk  08/14/2019 04/11/2019 03/27/2019 08/28/2018 06/15/2018  Falls in the past year? 0 1 1 0 0  Number falls in past yr: 0 1 1 0 -  Injury with Fall? 0 1 1 0 -  Risk for fall due to : - - - - -  Follow up - - Falls evaluation completed Falls evaluation completed -       Objective:   Vitals:   08/14/19 1105  Pulse: 71  Resp: 16  Temp: 97.6 F (36.4 C)  TempSrc: Temporal  SpO2: 99%  Weight: 133 lb 8 oz (60.6 kg)  Height: 5\' 3"  (1.6 m)    Body mass index is 23.65 kg/m.  Physical Exam  NAD, masked, pleasant, able to get up from the seated position in a chair and climb up on the table which included one-step with just minimal assistance. HEENT - [place]/AT, sclera anicteric, Abd - soft, NT (noted takes miralax to help with constipation) Ext - no marked LE edema,  Right Hip - ROM: Overall mild limitation in all hip motions, not marked     No marked pain with hip flexion when supine   No pain with hip abduction or adduction   No pain with internal or external rotation  Not tender with palpation at the hip joint proper  Tender with palpation at the trochanteric bursa region and slightly tender extending down towards the lateral thigh, no swelling  SLR neg and no pain when testing, could do straight leg on own without pain)  Sensation intact to LT in LE  LE strength was good including with dorsi and plantar flexion of the feet Neuro/psychiatric - affect was not flat, appropriate with conversation  Alert, expressed excitement with turning 89 in May  Grossly non-focal - good strength on testing extremities, sensation intact to LT in distal extremities  Speech normal  Hard of hearing noted      Assessment & Plan:    1. Hip pain, right Patient notes the pain is intermittent, and not very limiting to her presently.  Not severe.  Is helped when she takes Tylenol.  Has an element of trochanteric bursitis with tenderness to palpate this region.  Also a probable element of some arthritic component, with her hip motions on exam good today in the office.  Has a history of age-related osteoporosis noted.  No recent trauma or fall also noted making a traumatic fracture concern unlikely. Felt best to hold off on an x-ray today, and will continue to manage  conservatively. Continue with Tylenol products as needed as that has been helpful Also can apply cold topically, at brief intervals (like 5 to 10-minute intervals) as needed when painful to help. Activity modifications emphasized, with lessening activities that are more problematic for the hip in the short-term.  Still important to use her cane as she ambulates to lessen fall risk. Emphasized if symptoms worsening, limiting at all, to follow-up again, and likely will get an x-ray at that point.  2. Age-related osteoporosis without current pathological fracture As above  3. Hearing difficulty, unspecified laterality Not new  4. Trochanteric bursitis of right hip As above    Patient to follow-up if not improving or more problematic as we discussed today.     Jamelle Haring, MD 08/14/19 11:17 AM

## 2019-09-06 ENCOUNTER — Emergency Department
Admission: EM | Admit: 2019-09-06 | Discharge: 2019-09-06 | Disposition: A | Discharge status: Home / Self Care | Payer: Medicare HMO | Attending: Emergency Medicine | Admitting: Emergency Medicine

## 2019-09-06 ENCOUNTER — Encounter: Payer: Self-pay | Admitting: Emergency Medicine

## 2019-09-06 ENCOUNTER — Emergency Department: Payer: Medicare HMO

## 2019-09-06 ENCOUNTER — Other Ambulatory Visit: Payer: Self-pay

## 2019-09-06 DIAGNOSIS — Y999 Unspecified external cause status: Secondary | ICD-10-CM | POA: Insufficient documentation

## 2019-09-06 DIAGNOSIS — E039 Hypothyroidism, unspecified: Secondary | ICD-10-CM | POA: Insufficient documentation

## 2019-09-06 DIAGNOSIS — N183 Chronic kidney disease, stage 3 unspecified: Secondary | ICD-10-CM | POA: Diagnosis not present

## 2019-09-06 DIAGNOSIS — Y92003 Bedroom of unspecified non-institutional (private) residence as the place of occurrence of the external cause: Secondary | ICD-10-CM | POA: Diagnosis not present

## 2019-09-06 DIAGNOSIS — Y9389 Activity, other specified: Secondary | ICD-10-CM | POA: Diagnosis not present

## 2019-09-06 DIAGNOSIS — R109 Unspecified abdominal pain: Secondary | ICD-10-CM | POA: Diagnosis not present

## 2019-09-06 DIAGNOSIS — S32010A Wedge compression fracture of first lumbar vertebra, initial encounter for closed fracture: Secondary | ICD-10-CM

## 2019-09-06 DIAGNOSIS — M545 Low back pain, unspecified: Secondary | ICD-10-CM

## 2019-09-06 DIAGNOSIS — I129 Hypertensive chronic kidney disease with stage 1 through stage 4 chronic kidney disease, or unspecified chronic kidney disease: Secondary | ICD-10-CM | POA: Insufficient documentation

## 2019-09-06 DIAGNOSIS — M549 Dorsalgia, unspecified: Secondary | ICD-10-CM | POA: Diagnosis not present

## 2019-09-06 DIAGNOSIS — X500XXA Overexertion from strenuous movement or load, initial encounter: Secondary | ICD-10-CM | POA: Diagnosis not present

## 2019-09-06 MED ORDER — ACETAMINOPHEN 500 MG PO TABS
1000.0000 mg | ORAL_TABLET | Freq: Once | ORAL | Status: AC
  Administered 2019-09-06: 16:00:00 1000 mg via ORAL
  Filled 2019-09-06: qty 2

## 2019-09-06 NOTE — ED Notes (Signed)
Patient taken to imaging. 

## 2019-09-06 NOTE — ED Provider Notes (Signed)
[facility] Emergency Department Provider Note       Time seen: ----------------------------------------- 1:00 PM on 09/06/2019 -----------------------------------------   I have reviewed the triage vital signs and the nursing notes.  HISTORY   Chief Complaint Abdominal Pain   HPI Karen Davila is a 86 y.o. female with a history of arthritis, chronic kidney disease, GERD, hyperlipidemia, hypertension who presents to the ED for low back pain.  This differs from the triage note which notes abdominal pain.  Patient states she thinks she moved something the wrong way.  She was moving her mattress to sprayed for bedbugs when she moved a mattress she had sharp low back pain.  She denies any pain at this time.  Past Medical History:  Diagnosis Date  . Arthritis   . Chronic kidney disease   . GERD (gastroesophageal reflux disease)   . Hyperlipidemia   . Hypertension     Patient Active Problem List   Diagnosis Date Noted  . Anemia 06/15/2018  . Atherosclerosis of aorta ([facility]) 05/16/2018  . Venous insufficiency 08/08/2017  . Lichen simplex chronicus 08/08/2017  . Swelling of limb 05/30/2017  . Left arm pain 08/30/2016  . Bilateral leg cramps 07/06/2016  . Chronic kidney disease, stage 3 07/07/2015  . Cardiac murmur, previously undiagnosed 04/06/2015  . Dementia ([facility]) 04/06/2015  . Difficulty hearing 04/06/2015  . OP (osteoporosis) 04/06/2015  . Hypertension 12/23/2014  . GERD (gastroesophageal reflux disease) 12/23/2014  . Adult hypothyroidism 12/23/2014  . Hyperlipidemia 12/23/2014  . Iron deficiency anemia 12/23/2014    Past Surgical History:  Procedure Laterality Date  . EYE SURGERY Right    Cataract    Allergies Patient has no known allergies.  Social History Social History   Tobacco Use  . Smoking status: Never Smoker  . Smokeless tobacco: Never Used  . Tobacco comment: smoking cessation materials not required  Substance Use Topics   . Alcohol use: No    Alcohol/week: 0.0 standard drinks  . Drug use: No    Review of Systems Constitutional: Negative for fever. Cardiovascular: Negative for chest pain. Respiratory: Negative for shortness of breath. Gastrointestinal: Negative for abdominal pain, vomiting and diarrhea. Musculoskeletal: Positive for back pain Skin: Negative for rash. Neurological: Negative for headaches, focal weakness or numbness.  All systems negative/normal/unremarkable except as stated in the HPI  ____________________________________________   PHYSICAL EXAM:  VITAL SIGNS: ED Triage Vitals  Enc Vitals Group     BP 09/06/19 1122 (!) 147/61     Pulse Rate 09/06/19 1122 64     Resp 09/06/19 1122 18     Temp 09/06/19 1122 98.1 F (36.7 C)     Temp Source 09/06/19 1122 Oral     SpO2 09/06/19 1122 94 %     Weight 09/06/19 1123 140 lb (63.5 kg)     Height 09/06/19 1123 5\' 5"  (1.651 m)     Head Circumference --      Peak Flow --      Pain Score --      Pain Loc --      Pain Edu? --      Excl. in [place]? --     Constitutional: Alert and oriented. Well appearing and in no distress.  Patient very hard of hearing Eyes: Conjunctivae are normal. Normal extraocular movements. Cardiovascular: Normal rate, regular rhythm. No murmurs, rubs, or gallops. Respiratory: Normal respiratory effort without tachypnea nor retractions. Breath sounds are clear and equal bilaterally. No wheezes/rales/rhonchi. Gastrointestinal: Soft and nontender.  Normal bowel sounds Musculoskeletal: Nontender with normal range of motion in extremities. No lower extremity tenderness nor edema.  Negative cross and straight leg raise examination Neurologic:  Normal speech and language. No gross focal neurologic deficits are appreciated.  Skin:  Skin is warm, dry and intact. No rash noted. Psychiatric: Mood and affect are normal. Speech and behavior are normal.  ____________________________________________  ED COURSE:  As part of  my medical decision making, I reviewed the following data within the [place] History obtained from family if available, nursing notes, old chart and ekg, as well as notes from prior ED visits. Patient presented for low back pain, we will assess with labs and imaging as indicated at this time.   Procedures  Karen Davila was evaluated in Emergency Department on 09/06/2019 for the symptoms described in the history of present illness. She was evaluated in the context of the global COVID-19 pandemic, which necessitated consideration that the patient might be at risk for infection with the SARS-CoV-2 virus that causes COVID-19. Institutional protocols and algorithms that pertain to the evaluation of patients at risk for COVID-19 are in a state of rapid change based on information released by regulatory bodies including the CDC and federal and state organizations. These policies and algorithms were followed during the patient's care in the ED.  ____________________________________________   RADIOLOGY Images were viewed by me  Lumbar spine x-rays IMPRESSION: Old L4 compression fracture.  Subtle new superior endplate compression fracture of L1 with minimal anterior height loss. ____________________________________________   DIFFERENTIAL DIAGNOSIS   Musculoskeletal pain, spasm, sciatica unlikely, abdominal pain, dementia  FINAL ASSESSMENT AND PLAN  Low back pain, mild compression fracture   Plan: The patient had presented for low back pain after moving her mattress.  She has no complaints now, lumbar spine x-rays reveal a small L1 compression fracture with minimal loss of height.  She again has no pain at this time, I will advise Tylenol as needed.  Laurence Aly, MD    Note: This note was generated in part or whole with voice recognition software. Voice recognition is usually quite accurate but there are transcription errors that can and very often do occur. I  apologize for any typographical errors that were not detected and corrected.     Earleen Newport, MD 09/06/19 1524

## 2019-09-06 NOTE — ED Triage Notes (Signed)
Pt to ER via EMS from home with c/o abdominal pain.  Pt lives at home alone and has some baseline confusion. Pt is also extremely hard of hearing.  Pt reported to EMS that she was moving her mattress to spray for bed bugs and when she moved the mattress back, she noted a sharp, lower abdominal pain that radiated to her back.  Pt states that the pain has now subsided.  Pt unsure when last BM was.

## 2019-09-16 ENCOUNTER — Other Ambulatory Visit: Payer: Self-pay

## 2019-09-16 ENCOUNTER — Emergency Department
Admission: EM | Admit: 2019-09-16 | Discharge: 2019-09-16 | Disposition: A | Discharge status: Home / Self Care | Payer: Medicare HMO | Attending: Emergency Medicine | Admitting: Emergency Medicine

## 2019-09-16 ENCOUNTER — Encounter: Payer: Self-pay | Admitting: Emergency Medicine

## 2019-09-16 ENCOUNTER — Emergency Department: Payer: Medicare HMO

## 2019-09-16 DIAGNOSIS — R109 Unspecified abdominal pain: Secondary | ICD-10-CM | POA: Diagnosis not present

## 2019-09-16 DIAGNOSIS — S32010D Wedge compression fracture of first lumbar vertebra, subsequent encounter for fracture with routine healing: Secondary | ICD-10-CM | POA: Insufficient documentation

## 2019-09-16 DIAGNOSIS — R52 Pain, unspecified: Secondary | ICD-10-CM | POA: Diagnosis not present

## 2019-09-16 DIAGNOSIS — M545 Low back pain: Secondary | ICD-10-CM | POA: Diagnosis present

## 2019-09-16 DIAGNOSIS — S32010A Wedge compression fracture of first lumbar vertebra, initial encounter for closed fracture: Secondary | ICD-10-CM | POA: Diagnosis not present

## 2019-09-16 DIAGNOSIS — I129 Hypertensive chronic kidney disease with stage 1 through stage 4 chronic kidney disease, or unspecified chronic kidney disease: Secondary | ICD-10-CM | POA: Insufficient documentation

## 2019-09-16 DIAGNOSIS — R1032 Left lower quadrant pain: Secondary | ICD-10-CM | POA: Diagnosis not present

## 2019-09-16 DIAGNOSIS — M1990 Unspecified osteoarthritis, unspecified site: Secondary | ICD-10-CM | POA: Insufficient documentation

## 2019-09-16 DIAGNOSIS — N183 Chronic kidney disease, stage 3 unspecified: Secondary | ICD-10-CM | POA: Diagnosis not present

## 2019-09-16 DIAGNOSIS — I10 Essential (primary) hypertension: Secondary | ICD-10-CM | POA: Diagnosis not present

## 2019-09-16 DIAGNOSIS — X500XXD Overexertion from strenuous movement or load, subsequent encounter: Secondary | ICD-10-CM | POA: Diagnosis not present

## 2019-09-16 LAB — CBC WITH DIFFERENTIAL/PLATELET
Abs Immature Granulocytes: 0.01 10*3/uL (ref 0.00–0.07)
Basophils Absolute: 0 10*3/uL (ref 0.0–0.1)
Basophils Relative: 1 %
Eosinophils Absolute: 0.1 10*3/uL (ref 0.0–0.5)
Eosinophils Relative: 3 %
HCT: 28.7 % — ABNORMAL LOW (ref 36.0–46.0)
Hemoglobin: 9 g/dL — ABNORMAL LOW (ref 12.0–15.0)
Immature Granulocytes: 0 %
Lymphocytes Relative: 17 %
Lymphs Abs: 0.9 10*3/uL (ref 0.7–4.0)
MCH: 25.2 pg — ABNORMAL LOW (ref 26.0–34.0)
MCHC: 31.4 g/dL (ref 30.0–36.0)
MCV: 80.4 fL (ref 80.0–100.0)
Monocytes Absolute: 0.3 10*3/uL (ref 0.1–1.0)
Monocytes Relative: 6 %
Neutro Abs: 3.8 10*3/uL (ref 1.7–7.7)
Neutrophils Relative %: 73 %
Platelets: 404 10*3/uL — ABNORMAL HIGH (ref 150–400)
RBC: 3.57 MIL/uL — ABNORMAL LOW (ref 3.87–5.11)
RDW: 15 % (ref 11.5–15.5)
WBC: 5.2 10*3/uL (ref 4.0–10.5)
nRBC: 0 % (ref 0.0–0.2)

## 2019-09-16 LAB — COMPREHENSIVE METABOLIC PANEL
ALT: 19 U/L (ref 0–44)
AST: 28 U/L (ref 15–41)
Albumin: 3.5 g/dL (ref 3.5–5.0)
Alkaline Phosphatase: 70 U/L (ref 38–126)
Anion gap: 10 (ref 5–15)
BUN: 18 mg/dL (ref 8–23)
CO2: 26 mmol/L (ref 22–32)
Calcium: 9.3 mg/dL (ref 8.9–10.3)
Chloride: 104 mmol/L (ref 98–111)
Creatinine, Ser: 0.95 mg/dL (ref 0.44–1.00)
GFR calc Af Amer: 57 mL/min — ABNORMAL LOW (ref >60)
GFR calc non Af Amer: 49 mL/min — ABNORMAL LOW (ref >60)
Glucose, Bld: 92 mg/dL (ref 70–99)
Potassium: 4.5 mmol/L (ref 3.5–5.1)
Sodium: 140 mmol/L (ref 135–145)
Total Bilirubin: 0.6 mg/dL (ref 0.3–1.2)
Total Protein: 7.2 g/dL (ref 6.5–8.1)

## 2019-09-16 LAB — LIPASE, BLOOD: Lipase: 22 U/L (ref 11–51)

## 2019-09-16 MED ORDER — TRAMADOL HCL 50 MG PO TABS: 50.0000 mg | ORAL_TABLET | Freq: Four times a day (QID) | ORAL | 0 refills | Status: DC | PRN

## 2019-09-16 MED ORDER — ACETAMINOPHEN 500 MG PO TABS
1000.0000 mg | ORAL_TABLET | Freq: Once | ORAL | Status: AC
  Administered 2019-09-16: 1000 mg via ORAL
  Filled 2019-09-16: qty 2

## 2019-09-16 MED ORDER — DOCUSATE SODIUM 100 MG PO CAPS
100.0000 mg | ORAL_CAPSULE | Freq: Once | ORAL | Status: AC
  Administered 2019-09-16: 100 mg via ORAL
  Filled 2019-09-16: qty 1

## 2019-09-16 MED ORDER — IOHEXOL 300 MG/ML  SOLN
75.0000 mL | Freq: Once | INTRAMUSCULAR | Status: AC | PRN
  Administered 2019-09-16: 75 mL via INTRAVENOUS

## 2019-09-16 MED ORDER — ACETAMINOPHEN 500 MG PO TABS
1000.0000 mg | ORAL_TABLET | Freq: Once | ORAL | Status: AC
  Administered 2019-09-16: 1000 mg via ORAL

## 2019-09-16 MED ORDER — TRAMADOL HCL 50 MG PO TABS
50.0000 mg | ORAL_TABLET | Freq: Once | ORAL | Status: AC
  Administered 2019-09-16: 50 mg via ORAL
  Filled 2019-09-16: qty 1

## 2019-09-16 NOTE — ED Notes (Signed)
Pt back to room. Bed locked low. Rails up. Call bell within reach.

## 2019-09-16 NOTE — ED Provider Notes (Signed)
[facility] Emergency Department Provider Note       Time seen: ----------------------------------------- 9:02 AM on 09/16/2019 -----------------------------------------   I have reviewed the triage vital signs and the nursing notes.  HISTORY   Chief Complaint No chief complaint on file.    HPI Karen Davila is a 85 y.o. female with a history of arthritis, chronic kidney disease, GERD, hyperlipidemia, hypertension who presents to the ED for severe left-sided low back pain.  Patient was seen several weeks ago for similar and had an slight L1 compression fracture that occurred after she moved her mattress.  She denies any other injuries or complaints.  Past Medical History:  Diagnosis Date  . Arthritis   . Chronic kidney disease   . GERD (gastroesophageal reflux disease)   . Hyperlipidemia   . Hypertension     Patient Active Problem List   Diagnosis Date Noted  . Anemia 06/15/2018  . Atherosclerosis of aorta ([place]) 05/16/2018  . Venous insufficiency 08/08/2017  . Lichen simplex chronicus 08/08/2017  . Swelling of limb 05/30/2017  . Left arm pain 08/30/2016  . Bilateral leg cramps 07/06/2016  . Chronic kidney disease, stage 3 07/07/2015  . Cardiac murmur, previously undiagnosed 04/06/2015  . Dementia ([place]) 04/06/2015  . Difficulty hearing 04/06/2015  . OP (osteoporosis) 04/06/2015  . Hypertension 12/23/2014  . GERD (gastroesophageal reflux disease) 12/23/2014  . Adult hypothyroidism 12/23/2014  . Hyperlipidemia 12/23/2014  . Iron deficiency anemia 12/23/2014    Past Surgical History:  Procedure Laterality Date  . EYE SURGERY Right    Cataract    Allergies Patient has no known allergies.  Social History Social History   Tobacco Use  . Smoking status: Never Smoker  . Smokeless tobacco: Never Used  . Tobacco comment: smoking cessation materials not required  Substance Use Topics  . Alcohol use: No    Alcohol/week: 0.0 standard  drinks  . Drug use: No    Review of Systems Constitutional: Negative for fever. Cardiovascular: Negative for chest pain. Respiratory: Negative for shortness of breath. Gastrointestinal: Negative for abdominal pain, vomiting and diarrhea. Musculoskeletal: Positive for back pain Skin: Negative for rash. Neurological: Negative for headaches, focal weakness or numbness.  All systems negative/normal/unremarkable except as stated in the HPI  ____________________________________________   PHYSICAL EXAM:  VITAL SIGNS: ED Triage Vitals  Enc Vitals Group     BP      Pulse      Resp      Temp      Temp src      SpO2      Weight      Height      Head Circumference      Peak Flow      Pain Score      Pain Loc      Pain Edu?      Excl. in [place]?     Constitutional: Alert and oriented.  Mild distress from pain Eyes: Conjunctivae are normal. Normal extraocular movements. Cardiovascular: Normal rate, regular rhythm. No murmurs, rubs, or gallops. Respiratory: Normal respiratory effort without tachypnea nor retractions. Breath sounds are clear and equal bilaterally. No wheezes/rales/rhonchi. Gastrointestinal: Soft and nontender. Normal bowel sounds Musculoskeletal: Nontender with normal range of motion in extremities. No lower extremity tenderness nor edema.  Pain with range of motion of the low back, there appears to be some tenderness around the left flank. Neurologic:  Normal speech and language. No gross focal neurologic deficits are appreciated.  Skin:  Skin  is warm, dry and intact. No rash noted. Psychiatric: Mood and affect are normal. Speech and behavior are normal.  ____________________________________________  ED COURSE:  As part of my medical decision making, I reviewed the following data within the electronic MEDICAL RECORD NUMBER History obtained from family if available, nursing notes, old chart and ekg, as well as notes from prior ED visits. Patient presented for low back  pain, we will assess with labs and imaging as indicated at this time.   Procedures  Karen Davila was evaluated in Emergency Department on 09/16/2019 for the symptoms described in the history of present illness. She was evaluated in the context of the global COVID-19 pandemic, which necessitated consideration that the patient might be at risk for infection with the SARS-CoV-2 virus that causes COVID-19. Institutional protocols and algorithms that pertain to the evaluation of patients at risk for COVID-19 are in a state of rapid change based on information released by regulatory bodies including the CDC and federal and state organizations. These policies and algorithms were followed during the patient's care in the ED.  ____________________________________________   LABS (pertinent positives/negatives)  Labs Reviewed  CBC WITH DIFFERENTIAL/PLATELET - Abnormal; Notable for the following components:      Result Value   RBC 3.57 (*)    Hemoglobin 9.0 (*)    HCT 28.7 (*)    MCH 25.2 (*)    Platelets 404 (*)    All other components within normal limits  COMPREHENSIVE METABOLIC PANEL - Abnormal; Notable for the following components:   GFR calc non Af Amer 49 (*)    GFR calc Af Amer 57 (*)    All other components within normal limits  LIPASE, BLOOD  URINALYSIS, COMPLETE (UACMP) WITH MICROSCOPIC    RADIOLOGY Images were viewed by me  CT the abdomen pelvis with contrast  IMPRESSION:  1. New benign-appearing mild compression fracture of the superior  endplate of L1.  2. No other acute abnormality of the abdomen or pelvis.  3. Stable complex cystic lesion in the tail of the pancreas.   Aortic Atherosclerosis (ICD10-I70.0).  ____________________________________________   DIFFERENTIAL DIAGNOSIS   Renal colic, UTI, pyelonephritis, muscle strain, dissection, AAA  FINAL ASSESSMENT AND PLAN  Flank pain, L1 compression fracture   Plan: The patient had presented for left-sided low back  pain and flank pain. Patient's labs were unremarkable and stable for her. Patient's imaging did reveal an L1 compression fracture which is subacute and was known several weeks ago.  She denies any recent other injury.  She will be prescribed tramadol, is cleared for outpatient follow-up.   Ulice Dash, MD    Note: This note was generated in part or whole with voice recognition software. Voice recognition is usually quite accurate but there are transcription errors that can and very often do occur. I apologize for any typographical errors that were not detected and corrected.     Emily Filbert, MD 09/16/19 [phone]

## 2019-09-16 NOTE — ED Notes (Signed)
Pt asleep upon this RN's entrance to room. Easily woken. Pt denies pain then states the stretcher bothers her back. Pt declines another warm blanket. Denies any needs currently.

## 2019-09-16 NOTE — ED Notes (Signed)
Called pt's son Karrie Doffing who states he will arrive in to take pt home.

## 2019-09-16 NOTE — ED Triage Notes (Signed)
Pt here via ems from home with c/o left lower back pain, continuing from visit here a week ago. Pt is able to move slowly with walker/cane, however, states pain to lower back is with every movement. Denies cp/shob, appears in no distress at this time. Is alert and oriented, very HOH.

## 2019-10-05 ENCOUNTER — Encounter: Payer: Self-pay | Admitting: Emergency Medicine

## 2019-10-05 ENCOUNTER — Emergency Department
Admission: EM | Admit: 2019-10-05 | Discharge: 2019-10-05 | Disposition: A | Discharge status: Home / Self Care | Payer: Medicare HMO | Attending: Emergency Medicine | Admitting: Emergency Medicine

## 2019-10-05 ENCOUNTER — Other Ambulatory Visit: Payer: Self-pay

## 2019-10-05 ENCOUNTER — Emergency Department: Payer: Medicare HMO

## 2019-10-05 DIAGNOSIS — Z79899 Other long term (current) drug therapy: Secondary | ICD-10-CM | POA: Insufficient documentation

## 2019-10-05 DIAGNOSIS — Z7982 Long term (current) use of aspirin: Secondary | ICD-10-CM | POA: Diagnosis not present

## 2019-10-05 DIAGNOSIS — R52 Pain, unspecified: Secondary | ICD-10-CM | POA: Diagnosis not present

## 2019-10-05 DIAGNOSIS — K5900 Constipation, unspecified: Secondary | ICD-10-CM

## 2019-10-05 DIAGNOSIS — R109 Unspecified abdominal pain: Secondary | ICD-10-CM | POA: Diagnosis present

## 2019-10-05 DIAGNOSIS — M549 Dorsalgia, unspecified: Secondary | ICD-10-CM | POA: Diagnosis not present

## 2019-10-05 DIAGNOSIS — I129 Hypertensive chronic kidney disease with stage 1 through stage 4 chronic kidney disease, or unspecified chronic kidney disease: Secondary | ICD-10-CM | POA: Diagnosis not present

## 2019-10-05 DIAGNOSIS — M5489 Other dorsalgia: Secondary | ICD-10-CM | POA: Diagnosis not present

## 2019-10-05 DIAGNOSIS — R1030 Lower abdominal pain, unspecified: Secondary | ICD-10-CM | POA: Insufficient documentation

## 2019-10-05 DIAGNOSIS — N183 Chronic kidney disease, stage 3 unspecified: Secondary | ICD-10-CM | POA: Insufficient documentation

## 2019-10-05 DIAGNOSIS — I10 Essential (primary) hypertension: Secondary | ICD-10-CM | POA: Diagnosis not present

## 2019-10-05 LAB — URINALYSIS, COMPLETE (UACMP) WITH MICROSCOPIC
Bacteria, UA: NONE SEEN
Bilirubin Urine: NEGATIVE
Glucose, UA: NEGATIVE mg/dL
Hgb urine dipstick: NEGATIVE
Ketones, ur: NEGATIVE mg/dL
Leukocytes,Ua: NEGATIVE
Nitrite: NEGATIVE
Protein, ur: NEGATIVE mg/dL
Specific Gravity, Urine: 1.017 (ref 1.005–1.030)
pH: 5 (ref 5.0–8.0)

## 2019-10-05 LAB — TROPONIN I (HIGH SENSITIVITY): Troponin I (High Sensitivity): 12 ng/L (ref <18)

## 2019-10-05 LAB — COMPREHENSIVE METABOLIC PANEL
ALT: 15 U/L (ref 0–44)
AST: 25 U/L (ref 15–41)
Albumin: 3.6 g/dL (ref 3.5–5.0)
Alkaline Phosphatase: 75 U/L (ref 38–126)
Anion gap: 7 (ref 5–15)
BUN: 17 mg/dL (ref 8–23)
CO2: 29 mmol/L (ref 22–32)
Calcium: 9.4 mg/dL (ref 8.9–10.3)
Chloride: 106 mmol/L (ref 98–111)
Creatinine, Ser: 0.88 mg/dL (ref 0.44–1.00)
GFR calc Af Amer: >60 mL/min (ref >60)
GFR calc non Af Amer: 54 mL/min — ABNORMAL LOW (ref >60)
Glucose, Bld: 92 mg/dL (ref 70–99)
Potassium: 3.9 mmol/L (ref 3.5–5.1)
Sodium: 142 mmol/L (ref 135–145)
Total Bilirubin: 0.6 mg/dL (ref 0.3–1.2)
Total Protein: 7.1 g/dL (ref 6.5–8.1)

## 2019-10-05 LAB — CBC WITH DIFFERENTIAL/PLATELET
Abs Immature Granulocytes: 0.01 10*3/uL (ref 0.00–0.07)
Basophils Absolute: 0.1 10*3/uL (ref 0.0–0.1)
Basophils Relative: 1 %
Eosinophils Absolute: 0.1 10*3/uL (ref 0.0–0.5)
Eosinophils Relative: 2 %
HCT: 32.1 % — ABNORMAL LOW (ref 36.0–46.0)
Hemoglobin: 10 g/dL — ABNORMAL LOW (ref 12.0–15.0)
Immature Granulocytes: 0 %
Lymphocytes Relative: 29 %
Lymphs Abs: 1.5 10*3/uL (ref 0.7–4.0)
MCH: 25.4 pg — ABNORMAL LOW (ref 26.0–34.0)
MCHC: 31.2 g/dL (ref 30.0–36.0)
MCV: 81.7 fL (ref 80.0–100.0)
Monocytes Absolute: 0.4 10*3/uL (ref 0.1–1.0)
Monocytes Relative: 8 %
Neutro Abs: 3.1 10*3/uL (ref 1.7–7.7)
Neutrophils Relative %: 60 %
Platelets: 236 10*3/uL (ref 150–400)
RBC: 3.93 MIL/uL (ref 3.87–5.11)
RDW: 15.4 % (ref 11.5–15.5)
WBC: 5.2 10*3/uL (ref 4.0–10.5)
nRBC: 0 % (ref 0.0–0.2)

## 2019-10-05 LAB — LIPASE, BLOOD: Lipase: 23 U/L (ref 11–51)

## 2019-10-05 LAB — LACTIC ACID, PLASMA: Lactic Acid, Venous: 1.3 mmol/L (ref 0.5–1.9)

## 2019-10-05 MED ORDER — SODIUM CHLORIDE 0.9 % IV BOLUS
500.0000 mL | Freq: Once | INTRAVENOUS | Status: AC
  Administered 2019-10-05: 06:00:00 500 mL via INTRAVENOUS

## 2019-10-05 MED ORDER — LACTULOSE 10 GM/15ML PO SOLN: 20.0000 g | Freq: Every day | ORAL | 0 refills | Status: DC | PRN

## 2019-10-05 NOTE — ED Provider Notes (Signed)
[facility] Emergency Department Provider Note   ____________________________________________   First MD Initiated Contact with Patient 10/05/19 [phone]     (approximate)  I have reviewed the triage vital signs and the nursing notes.   HISTORY  Chief Complaint Abdominal Pain and Back Pain    HPI Karen Davila is a 86 y.o. female brought to the ED via EMS from [place] with a chief complaint of lower abdominal and back pain.  Patient reports she has had this problem before.  She was seen at the beginning of this month for same and found to have L1 compression fracture on CT abdomen/pelvis.  Patient denies associated fever, chest pain, shortness of breath, nausea, vomiting, dysuria.  Does endorse constipation and does not remember the last time she had a bowel movement.       Past Medical History:  Diagnosis Date  . Arthritis   . Chronic kidney disease   . GERD (gastroesophageal reflux disease)   . Hyperlipidemia   . Hypertension     Patient Active Problem List   Diagnosis Date Noted  . Anemia 06/15/2018  . Atherosclerosis of aorta ([place]) 05/16/2018  . Venous insufficiency 08/08/2017  . Lichen simplex chronicus 08/08/2017  . Swelling of limb 05/30/2017  . Left arm pain 08/30/2016  . Bilateral leg cramps 07/06/2016  . Chronic kidney disease, stage 3 07/07/2015  . Cardiac murmur, previously undiagnosed 04/06/2015  . Dementia ([employer]) 04/06/2015  . Difficulty hearing 04/06/2015  . OP (osteoporosis) 04/06/2015  . Hypertension 12/23/2014  . GERD (gastroesophageal reflux disease) 12/23/2014  . Adult hypothyroidism 12/23/2014  . Hyperlipidemia 12/23/2014  . Iron deficiency anemia 12/23/2014    Past Surgical History:  Procedure Laterality Date  . EYE SURGERY Right    Cataract    Prior to Admission medications   Medication Sig Start Date End Date Taking? Authorizing Provider  amLODipine (NORVASC) 5 MG tablet TAKE 1 & 1/2 (ONE &  ONE-HALF) TABLETS BY MOUTH DAILY 02/08/19   Poulose, Bethel Born, NP  aspirin EC 81 MG tablet Take 81 mg by mouth daily.    [provider]  atorvastatin (LIPITOR) 10 MG tablet Take 1 tablet (10 mg total) by mouth daily. 05/15/19   Delsa Grana, PA-C  bisacodyl (DULCOLAX) 10 MG suppository Place 1 suppository (10 mg total) rectally as needed for moderate constipation or severe constipation. 05/15/18   Poulose, Bethel Born, NP  diclofenac sodium (VOLTAREN) 1 % GEL Apply 2 g topically 4 (four) times daily. 03/27/19   Hubbard Hartshorn, FNP  docusate sodium (COLACE) 100 MG capsule Take 1 capsule (100 mg total) by mouth 2 (two) times daily. 05/15/18   Poulose, Bethel Born, NP  Elastic Bandages & Supports (TRUFORM STOCKINGS 20-30MMHG) MISC Wear stockings, put on in the morning and remove at night 08/08/17   Lada, Satira Anis, MD  famotidine (PEPCID) 20 MG tablet Take 1 tablet (20 mg total) by mouth daily. 11/30/18   Poulose, Bethel Born, NP  hydrocortisone (ANUSOL-HC) 2.5 % rectal cream Place 1 application rectally 2 (two) times daily. 08/28/18   Hubbard Hartshorn, FNP  hydrocortisone cream 0.5 % Apply 1 application topically 2 (two) times daily. 05/20/19   Gregor Hams, MD  lactulose (CHRONULAC) 10 GM/15ML solution Take 30 mLs (20 g total) by mouth daily as needed for mild constipation. 10/05/19   Paulette Blanch, MD  levothyroxine (SYNTHROID) 50 MCG tablet TAKE 1 TABLET BY MOUTH ONCE DAILY BEFORE BREAKFAST 06/05/19  [name], PA-C  Lidocaine 0.5 % GEL Apply 1 application topically daily. 11/02/17   Poulose, Percell Belt, NP  lisinopril (ZESTRIL) 20 MG tablet Take 1 tablet (20 mg total) by mouth daily. 05/27/19   [name], PA-C  Olopatadine HCl 0.2 % SOLN Apply 1 drop to eye every morning. 05/24/16   Triplett, Rulon Eisenmenger B, FNP  polyethylene glycol (MIRALAX) packet Take 17 g by mouth daily. 05/07/18   Jeanmarie Plant, MD  traMADol (ULTRAM) 50 MG tablet Take 1 tablet (50 mg total) by mouth every 6 (six) hours  as needed. 09/16/19 09/15/20  Emily Filbert, MD  triamcinolone cream (KENALOG) 0.1 % Apply 1 application topically 2 (two) times daily. (skin over the ankles and lower legs) 08/08/17   Lada, Janit Bern, MD  ranitidine (ZANTAC) 150 MG tablet TAKE 1 TABLET BY MOUTH AT BEDTIME FOR STOMACH Patient not taking: Reported on 08/28/2018 02/22/18 11/30/18  Kerman Passey, MD    Allergies Patient has no known allergies.  Family History  Problem Relation Age of Onset  . Stroke Mother   . Healthy Father     Social History Social History   Tobacco Use  . Smoking status: Never Smoker  . Smokeless tobacco: Never Used  . Tobacco comment: smoking cessation materials not required  Substance Use Topics  . Alcohol use: No    Alcohol/week: 0.0 standard drinks  . Drug use: No    Review of Systems  Constitutional: No fever/chills Eyes: No visual changes. ENT: No sore throat. Cardiovascular: Denies chest pain. Respiratory: Denies shortness of breath. Gastrointestinal: Positive for abdominal pain.  No nausea, no vomiting.  No diarrhea.  No constipation. Genitourinary: Negative for dysuria. Musculoskeletal: Positive for back pain. Skin: Negative for rash. Neurological: Negative for headaches, focal weakness or numbness.   ____________________________________________   PHYSICAL EXAM:  VITAL SIGNS: ED Triage Vitals [10/05/19 0448]  Enc Vitals Group     BP      Pulse      Resp      Temp      Temp src      SpO2 100 %     Weight      Height      Head Circumference      Peak Flow      Pain Score      Pain Loc      Pain Edu?      Excl. in GC?     Constitutional: Alert and oriented. Well appearing and in no acute distress.  Hard of hearing. Eyes: Conjunctivae are normal. PERRL. EOMI. Head: Atraumatic. Nose: No congestion/rhinnorhea. Mouth/Throat: Mucous membranes are moist.  Oropharynx non-erythematous. Neck: No stridor.   Cardiovascular: Normal rate, regular rhythm. Grossly  normal heart sounds.  Good peripheral circulation. Respiratory: Normal respiratory effort.  No retractions. Lungs CTAB. Gastrointestinal: Soft and nontender to light or deep palpation. No distention. No abdominal bruits. No CVA tenderness. Musculoskeletal: No lower extremity tenderness nor edema.  No joint effusions. Neurologic:  Normal speech and language. No gross focal neurologic deficits are appreciated.  Skin:  Skin is warm, dry and intact. No rash noted. Psychiatric: Mood and affect are normal. Speech and behavior are normal.  ____________________________________________   LABS (all labs ordered are listed, but only abnormal results are displayed)  Labs Reviewed  CBC WITH DIFFERENTIAL/PLATELET - Abnormal; Notable for the following components:      Result Value   Hemoglobin 10.0 (*)    HCT 32.1 (*)  MCH 25.4 (*)    All other components within normal limits  COMPREHENSIVE METABOLIC PANEL - Abnormal; Notable for the following components:   GFR calc non Af Amer 54 (*)    All other components within normal limits  URINALYSIS, COMPLETE (UACMP) WITH MICROSCOPIC - Abnormal; Notable for the following components:   Color, Urine YELLOW (*)    APPearance CLEAR (*)    All other components within normal limits  LIPASE, BLOOD  LACTIC ACID, PLASMA  TROPONIN I (HIGH SENSITIVITY)   ____________________________________________  EKG  ED ECG REPORT I, Zakariya Knickerbocker J, the attending physician, personally viewed and interpreted this ECG.   Date: 10/05/2019  EKG Time: 0448  Rate: 69  Rhythm: normal EKG, normal sinus rhythm  Axis: Normal  Intervals:none  ST&T Change: Nonspecific  ____________________________________________  RADIOLOGY  ED MD interpretation: Constipation  Official radiology report(s): DG Abd Acute W/Chest  Result Date: 10/05/2019 CLINICAL DATA:  86 year old female with history of abdominal pain and constipation. EXAM: DG ABDOMEN ACUTE W/ 1V CHEST COMPARISON:   Abdominal radiograph 05/15/2018. FINDINGS: Lung volumes are normal. No consolidative airspace disease. No pleural effusions. No pneumothorax. No pulmonary nodule or mass noted. Pulmonary vasculature and the cardiomediastinal silhouette are within normal limits. Atherosclerosis Abdominal radiographs demonstrate gas and stool scattered throughout the colon extending to the distal rectum. Large stool burden. No pathologic dilatation of small bowel or colon. No pneumoperitoneum. IMPRESSION: 1. Findings suggest constipation. 2. Nonobstructive bowel gas pattern. 3. No pneumoperitoneum. 4. No radiographic evidence of acute cardiopulmonary disease. 5. Aortic atherosclerosis. Electronically Signed   By: Trudie Reed M.D.   On: 10/05/2019 05:57    ____________________________________________   PROCEDURES  Procedure(s) performed (including Critical Care):  Procedures   ____________________________________________   INITIAL IMPRESSION / ASSESSMENT AND PLAN / ED COURSE  As part of my medical decision making, I reviewed the following data within the electronic MEDICAL RECORD NUMBER Nursing notes reviewed and incorporated, Labs reviewed, EKG interpreted, Old chart reviewed, Radiograph reviewed and Notes from prior ED visits     Karen Davila was evaluated in Emergency Department on 10/05/2019 for the symptoms described in the history of present illness. She was evaluated in the context of the global COVID-19 pandemic, which necessitated consideration that the patient might be at risk for infection with the SARS-CoV-2 virus that causes COVID-19. Institutional protocols and algorithms that pertain to the evaluation of patients at risk for COVID-19 are in a state of rapid change based on information released by regulatory bodies including the CDC and federal and state organizations. These policies and algorithms were followed during the patient's care in the ED.    86 year old female presenting with lower  abdominal and back pain.  Recent L1 compression fracture found on CT abdomen/pelvis. Differential diagnosis includes, but is not limited to, ovarian cyst, ovarian torsion, acute appendicitis, diverticulitis, urinary tract infection/pyelonephritis, endometriosis, bowel obstruction, colitis, renal colic, gastroenteritis, hernia, fibroids, etc.  Will obtain labwork, UA, xrays. I personally reviewed the patient's chart and see her recent ED visit including CT abdomen/pelvis.  Patient currently in no acute distress.  Abdominal exam benign.   Clinical Course as of Oct 05 639  Sat Oct 05, 2019  0602 Patient resting in no acute distress.  Voices no complaints at this time.  Updated her on all test results.  Will discharge home on Lactulose to use as needed and recommend daily bowel regimen.  Strict return precautions given.  Patient verbalizes understanding and agrees with plan of care.   [  JS]    Clinical Course User Index [JS] Irean Hong, MD     ____________________________________________   FINAL CLINICAL IMPRESSION(S) / ED DIAGNOSES  Final diagnoses:  Lower abdominal pain  Constipation, unspecified constipation type     ED Discharge Orders         Ordered    lactulose (CHRONULAC) 10 GM/15ML solution  Daily PRN     10/05/19 0603           Note:  This document was prepared using Dragon voice recognition software and may include unintentional dictation errors.   Irean Hong, MD 10/05/19 [phone]

## 2019-10-05 NOTE — Discharge Instructions (Signed)
1.  You may take Lactulose as needed for bowel movements. 2.  I recommend the following over-the-counter medicines to regulate daily bowel movements: MiraLAX Stool softener such as Colace Fiber Plenty of fluids 3.  Return to the ER for worsening symptoms, persistent vomiting, difficulty breathing or other concerns.

## 2019-10-05 NOTE — ED Notes (Signed)
This RN to bedside, introduced self to patient, pt visualized in NAD, pt states she is ready to go now and requested this RN to call her son. This RN called and notified her emergency contact Karrie Doffing of pending D/C, states he will come and pick patient up. Pt denies further needs at this time.

## 2019-10-05 NOTE — ED Triage Notes (Addendum)
Pt from [facility] via [facility] for abd pain and back pain. Pt st she has had this problem before. Pt A&Ox4.  Pt's BP with EMS was 200/87 other vitals were stable and WNL per medic

## 2019-10-05 NOTE — ED Notes (Signed)
Pt's emergency contact Karen Davila at bedside while this RN reviewed D/C instructions. EDP sung aware of patient's continued HTN, states okay for D/C. This RN reviewed with patient to take her home medications for BP when she arrived at home. Pt states understanding. Pt visualized in NAD at time of D/C. Pt taken to lobby via wheelchair. D/C into the care of Karen Davila at this time.

## 2019-10-05 NOTE — ED Notes (Signed)
Pt taken to Xray.

## 2019-10-08 ENCOUNTER — Other Ambulatory Visit: Payer: Self-pay | Admitting: Family Medicine

## 2019-10-08 NOTE — Telephone Encounter (Signed)
Requested medication (s) are due for refill today: yes  Requested medication (s) are on the active medication list: yes   Future visit scheduled: no  Notes to clinic:  medication was prescribed in ED Review for usage and refill   Requested Prescriptions  Pending Prescriptions Disp Refills   lactulose (CHRONULAC) 10 GM/15ML solution 120 mL 0    Sig: Take 30 mLs (20 g total) by mouth daily as needed for mild constipation.      Gastroenterology:  Laxatives Passed - 10/08/2019  8:36 AM      Passed - Valid encounter within last 12 months    Recent Outpatient Visits           1 month ago Hip pain, right   [facility] Jamelle Haring, MD   6 months ago Contusion of left foot, initial encounter   [facility] [name], PA-C   6 months ago Bilateral leg and foot pain   [facility] [place], Gerome Apley, FNP   1 year ago BRBPR (bright red blood per rectum)   [facility] Doren Custard, FNP   1 year ago Adult hypothyroidism   [facility] Alice Lada, Janit Bern, MD

## 2019-10-08 NOTE — Telephone Encounter (Signed)
Medication Refill - Medication: lactulose (CHRONULAC) 10 GM/15ML solution    Preferred Pharmacy (with phone number or street name):  [facility] [address] [place]), [place] - [address] Phone:  [phone]  Fax:  [phone]       Agent: Please be advised that RX refills may take up to 3 business days. We ask that you follow-up with your pharmacy.

## 2019-10-11 ENCOUNTER — Other Ambulatory Visit: Payer: Self-pay

## 2019-11-06 ENCOUNTER — Encounter: Payer: Self-pay | Admitting: Emergency Medicine

## 2019-11-06 ENCOUNTER — Emergency Department
Admission: EM | Admit: 2019-11-06 | Discharge: 2019-11-06 | Disposition: A | Discharge status: Home / Self Care | Payer: Medicare HMO | Attending: Emergency Medicine | Admitting: Emergency Medicine

## 2019-11-06 ENCOUNTER — Other Ambulatory Visit: Payer: Self-pay

## 2019-11-06 DIAGNOSIS — R1084 Generalized abdominal pain: Secondary | ICD-10-CM | POA: Diagnosis not present

## 2019-11-06 DIAGNOSIS — I10 Essential (primary) hypertension: Secondary | ICD-10-CM | POA: Diagnosis not present

## 2019-11-06 DIAGNOSIS — R1031 Right lower quadrant pain: Secondary | ICD-10-CM | POA: Insufficient documentation

## 2019-11-06 DIAGNOSIS — Z5321 Procedure and treatment not carried out due to patient leaving prior to being seen by health care provider: Secondary | ICD-10-CM | POA: Diagnosis not present

## 2019-11-06 DIAGNOSIS — R52 Pain, unspecified: Secondary | ICD-10-CM | POA: Diagnosis not present

## 2019-11-06 LAB — COMPREHENSIVE METABOLIC PANEL
ALT: 13 U/L (ref 0–44)
AST: 25 U/L (ref 15–41)
Albumin: 3.7 g/dL (ref 3.5–5.0)
Alkaline Phosphatase: 58 U/L (ref 38–126)
Anion gap: 8 (ref 5–15)
BUN: 15 mg/dL (ref 8–23)
CO2: 29 mmol/L (ref 22–32)
Calcium: 9.4 mg/dL (ref 8.9–10.3)
Chloride: 103 mmol/L (ref 98–111)
Creatinine, Ser: 0.93 mg/dL (ref 0.44–1.00)
GFR calc Af Amer: 58 mL/min — ABNORMAL LOW (ref >60)
GFR calc non Af Amer: 50 mL/min — ABNORMAL LOW (ref >60)
Glucose, Bld: 95 mg/dL (ref 70–99)
Potassium: 4 mmol/L (ref 3.5–5.1)
Sodium: 140 mmol/L (ref 135–145)
Total Bilirubin: 0.7 mg/dL (ref 0.3–1.2)
Total Protein: 7.2 g/dL (ref 6.5–8.1)

## 2019-11-06 LAB — LIPASE, BLOOD: Lipase: 25 U/L (ref 11–51)

## 2019-11-06 LAB — CBC
HCT: 30.2 % — ABNORMAL LOW (ref 36.0–46.0)
Hemoglobin: 9.5 g/dL — ABNORMAL LOW (ref 12.0–15.0)
MCH: 25.8 pg — ABNORMAL LOW (ref 26.0–34.0)
MCHC: 31.5 g/dL (ref 30.0–36.0)
MCV: 82.1 fL (ref 80.0–100.0)
Platelets: 228 10*3/uL (ref 150–400)
RBC: 3.68 MIL/uL — ABNORMAL LOW (ref 3.87–5.11)
RDW: 15.9 % — ABNORMAL HIGH (ref 11.5–15.5)
WBC: 6.1 10*3/uL (ref 4.0–10.5)
nRBC: 0 % (ref 0.0–0.2)

## 2019-11-06 MED ORDER — SODIUM CHLORIDE 0.9% FLUSH: 3.0000 mL | Freq: Once | INTRAVENOUS | Status: DC

## 2019-11-06 NOTE — ED Triage Notes (Signed)
Pt presents to ED via [facility] with c/o lower abdominal pain and bilateral lower back pain that started yesterday, pt denies N/V/D, pt states pain relieved with "rubbing" on her back. Pt visualized in NAD at this time. Pt also noted to be HOH at this time.

## 2019-11-06 NOTE — ED Triage Notes (Signed)
First nurse note- arrived ems for right flank/right abdominal pain. bp 194/100

## 2019-11-06 NOTE — ED Notes (Signed)
Pt's son arrived and pt requesting to go home. Return precautions reviewed with patient and Casimiro Needle. States understanding.

## 2019-11-06 NOTE — ED Notes (Signed)
This RN called pt's son Karrie Doffing [phone] and notified him that patient was in the ER. States will come pick patient up. Explained to patient's son will make exception and allow him to sit with patient if she is still in the lobby.

## 2019-11-08 ENCOUNTER — Telehealth: Payer: Self-pay | Admitting: Family Medicine

## 2019-11-08 NOTE — Telephone Encounter (Signed)
Patient's God Son Karen Davila, is calling to schedule the patient a hospital follow up. Patient was discharged 11/06/19. Patient was not seen. She left prior to being seen. Patient reports that she is still have back & ab pain. Told god son to call back with the patient to speak with NT. Soonest hospital follow up apt is 11/29/19. Please advise with Micheal CB- [phone]

## 2019-11-08 NOTE — Telephone Encounter (Signed)
Spoke with Cassandra, advised Micheal to take the patient to Urgent Care since there was only one doctor on staff today. And no available appts. Micheal agreed he would take the patient to UC.

## 2020-01-10 ENCOUNTER — Other Ambulatory Visit: Payer: Self-pay

## 2020-01-10 ENCOUNTER — Emergency Department
Admission: EM | Admit: 2020-01-10 | Discharge: 2020-01-10 | Disposition: A | Discharge status: Home / Self Care | Payer: Medicare HMO | Attending: Emergency Medicine | Admitting: Emergency Medicine

## 2020-01-10 ENCOUNTER — Encounter: Payer: Self-pay | Admitting: Psychiatry

## 2020-01-10 ENCOUNTER — Emergency Department: Payer: Medicare HMO

## 2020-01-10 DIAGNOSIS — K5900 Constipation, unspecified: Secondary | ICD-10-CM | POA: Insufficient documentation

## 2020-01-10 DIAGNOSIS — Z79899 Other long term (current) drug therapy: Secondary | ICD-10-CM | POA: Insufficient documentation

## 2020-01-10 DIAGNOSIS — K6289 Other specified diseases of anus and rectum: Secondary | ICD-10-CM | POA: Diagnosis not present

## 2020-01-10 DIAGNOSIS — F0390 Unspecified dementia without behavioral disturbance: Secondary | ICD-10-CM | POA: Insufficient documentation

## 2020-01-10 DIAGNOSIS — I129 Hypertensive chronic kidney disease with stage 1 through stage 4 chronic kidney disease, or unspecified chronic kidney disease: Secondary | ICD-10-CM | POA: Insufficient documentation

## 2020-01-10 DIAGNOSIS — I10 Essential (primary) hypertension: Secondary | ICD-10-CM | POA: Diagnosis not present

## 2020-01-10 DIAGNOSIS — E039 Hypothyroidism, unspecified: Secondary | ICD-10-CM | POA: Insufficient documentation

## 2020-01-10 DIAGNOSIS — N183 Chronic kidney disease, stage 3 unspecified: Secondary | ICD-10-CM | POA: Insufficient documentation

## 2020-01-10 DIAGNOSIS — R0902 Hypoxemia: Secondary | ICD-10-CM | POA: Diagnosis not present

## 2020-01-10 DIAGNOSIS — N289 Disorder of kidney and ureter, unspecified: Secondary | ICD-10-CM | POA: Diagnosis not present

## 2020-01-10 LAB — COMPREHENSIVE METABOLIC PANEL
ALT: 13 U/L (ref 0–44)
AST: 24 U/L (ref 15–41)
Albumin: 4 g/dL (ref 3.5–5.0)
Alkaline Phosphatase: 59 U/L (ref 38–126)
Anion gap: 9 (ref 5–15)
BUN: 17 mg/dL (ref 8–23)
CO2: 26 mmol/L (ref 22–32)
Calcium: 9.4 mg/dL (ref 8.9–10.3)
Chloride: 104 mmol/L (ref 98–111)
Creatinine, Ser: 1.03 mg/dL — ABNORMAL HIGH (ref 0.44–1.00)
GFR calc Af Amer: 52 mL/min — ABNORMAL LOW (ref >60)
GFR calc non Af Amer: 45 mL/min — ABNORMAL LOW (ref >60)
Glucose, Bld: 96 mg/dL (ref 70–99)
Potassium: 3.9 mmol/L (ref 3.5–5.1)
Sodium: 139 mmol/L (ref 135–145)
Total Bilirubin: 0.5 mg/dL (ref 0.3–1.2)
Total Protein: 7.5 g/dL (ref 6.5–8.1)

## 2020-01-10 LAB — CBC WITH DIFFERENTIAL/PLATELET
Abs Immature Granulocytes: 0.02 10*3/uL (ref 0.00–0.07)
Basophils Absolute: 0 10*3/uL (ref 0.0–0.1)
Basophils Relative: 0 %
Eosinophils Absolute: 0.1 10*3/uL (ref 0.0–0.5)
Eosinophils Relative: 1 %
HCT: 31.1 % — ABNORMAL LOW (ref 36.0–46.0)
Hemoglobin: 10.1 g/dL — ABNORMAL LOW (ref 12.0–15.0)
Immature Granulocytes: 0 %
Lymphocytes Relative: 9 %
Lymphs Abs: 0.9 10*3/uL (ref 0.7–4.0)
MCH: 25.8 pg — ABNORMAL LOW (ref 26.0–34.0)
MCHC: 32.5 g/dL (ref 30.0–36.0)
MCV: 79.5 fL — ABNORMAL LOW (ref 80.0–100.0)
Monocytes Absolute: 0.5 10*3/uL (ref 0.1–1.0)
Monocytes Relative: 5 %
Neutro Abs: 8.7 10*3/uL — ABNORMAL HIGH (ref 1.7–7.7)
Neutrophils Relative %: 85 %
Platelets: 229 10*3/uL (ref 150–400)
RBC: 3.91 MIL/uL (ref 3.87–5.11)
RDW: 15.6 % — ABNORMAL HIGH (ref 11.5–15.5)
WBC: 10.2 10*3/uL (ref 4.0–10.5)
nRBC: 0 % (ref 0.0–0.2)

## 2020-01-10 LAB — LIPASE, BLOOD: Lipase: 33 U/L (ref 11–51)

## 2020-01-10 MED ORDER — MAGNESIUM CITRATE PO SOLN: 1.0000 | Freq: Once | ORAL | 0 refills | Status: AC

## 2020-01-10 MED ORDER — ACETAMINOPHEN 500 MG PO TABS: 1000.0000 mg | ORAL_TABLET | Freq: Once | ORAL | Status: DC

## 2020-01-10 MED ORDER — IOHEXOL 300 MG/ML  SOLN
75.0000 mL | Freq: Once | INTRAMUSCULAR | Status: AC | PRN
  Administered 2020-01-10: 75 mL via INTRAVENOUS

## 2020-01-10 MED ORDER — POLYETHYLENE GLYCOL 3350 17 G PO PACK: 17.0000 g | PACK | Freq: Every day | ORAL | 0 refills | Status: DC

## 2020-01-10 NOTE — ED Triage Notes (Signed)
Patient arrived via [facility] from [place] house where she lives by herself. Patient has c/o constipation, she says she does not recall having a bowel movment in 2 days, she remembers having a bowel movement one day this week. Patient says she feels like she has to go to the bathroom and she cant.

## 2020-01-10 NOTE — ED Notes (Signed)
Pt up to commode, having bowel movement and urinating.

## 2020-01-10 NOTE — ED Notes (Signed)
Patient able to ambulate with assistance to bedside commode, patient attempted to have a bowel movement but only passed gas

## 2020-01-10 NOTE — Discharge Instructions (Addendum)
Please seek medical attention for any high fevers, chest pain, shortness of breath, change in behavior, persistent vomiting, bloody stool or any other new or concerning symptoms.  

## 2020-01-10 NOTE — ED Provider Notes (Signed)
[facility] Emergency Department Provider Note   ____________________________________________   I have reviewed the triage vital signs and the nursing notes.   HISTORY  Chief Complaint Constipation   History limited by: Not Limited   HPI Karen Davila is a 86 y.o. female who presents to the emergency department today because of concerns for constipation.  Patient states she has not had a bowel movement the past 2 days.  Patient however does state that she does not have a bowel movement every day.  It is unclear exactly why she is worried about constipation at this time.  She does however state that her constipation has been accompanied by abdominal pain.  Located in the left side of her abdomen.  Patient denies any fevers.  Records reviewed. Per medical record review patient has a history of HLD, HTN, CKD.   Past Medical History:  Diagnosis Date  . Arthritis   . Chronic kidney disease   . GERD (gastroesophageal reflux disease)   . Hyperlipidemia   . Hypertension     Patient Active Problem List   Diagnosis Date Noted  . Anemia 06/15/2018  . Atherosclerosis of aorta (HCC) 05/16/2018  . Venous insufficiency 08/08/2017  . Lichen simplex chronicus 08/08/2017  . Swelling of limb 05/30/2017  . Left arm pain 08/30/2016  . Bilateral leg cramps 07/06/2016  . Chronic kidney disease, stage 3 07/07/2015  . Cardiac murmur, previously undiagnosed 04/06/2015  . Dementia (HCC) 04/06/2015  . Difficulty hearing 04/06/2015  . OP (osteoporosis) 04/06/2015  . Hypertension 12/23/2014  . GERD (gastroesophageal reflux disease) 12/23/2014  . Adult hypothyroidism 12/23/2014  . Hyperlipidemia 12/23/2014  . Iron deficiency anemia 12/23/2014    Past Surgical History:  Procedure Laterality Date  . EYE SURGERY Right    Cataract    Prior to Admission medications   Medication Sig Start Date End Date Taking? Authorizing Provider  amLODipine (NORVASC) 5 MG tablet TAKE  1 & 1/2 (ONE & ONE-HALF) TABLETS BY MOUTH DAILY 02/08/19   Poulose, Percell Belt, NP  aspirin EC 81 MG tablet Take 81 mg by mouth daily.    [provider]  atorvastatin (LIPITOR) 10 MG tablet Take 1 tablet (10 mg total) by mouth daily. 05/15/19   [name], PA-C  bisacodyl (DULCOLAX) 10 MG suppository Place 1 suppository (10 mg total) rectally as needed for moderate constipation or severe constipation. 05/15/18   Poulose, Percell Belt, NP  diclofenac sodium (VOLTAREN) 1 % GEL Apply 2 g topically 4 (four) times daily. 03/27/19   Doren Custard, FNP  docusate sodium (COLACE) 100 MG capsule Take 1 capsule (100 mg total) by mouth 2 (two) times daily. 05/15/18   Poulose, Percell Belt, NP  Elastic Bandages & Supports (TRUFORM STOCKINGS 20-30MMHG) MISC Wear stockings, put on in the morning and remove at night 08/08/17   Lada, Janit Bern, MD  famotidine (PEPCID) 20 MG tablet Take 1 tablet (20 mg total) by mouth daily. 11/30/18   Poulose, Percell Belt, NP  hydrocortisone (ANUSOL-HC) 2.5 % rectal cream Place 1 application rectally 2 (two) times daily. 08/28/18   Doren Custard, FNP  hydrocortisone cream 0.5 % Apply 1 application topically 2 (two) times daily. 05/20/19   Darci Current, MD  lactulose (CHRONULAC) 10 GM/15ML solution Take 30 mLs (20 g total) by mouth daily as needed for mild constipation. 10/05/19   Irean Hong, MD  levothyroxine (SYNTHROID) 50 MCG tablet TAKE 1 TABLET BY MOUTH ONCE DAILY BEFORE BREAKFAST 06/05/19  [name], PA-C  Lidocaine 0.5 % GEL Apply 1 application topically daily. 11/02/17   Poulose, Percell Belt, NP  lisinopril (ZESTRIL) 20 MG tablet Take 1 tablet (20 mg total) by mouth daily. 05/27/19   [name], PA-C  Olopatadine HCl 0.2 % SOLN Apply 1 drop to eye every morning. 05/24/16   Triplett, Rulon Eisenmenger B, FNP  polyethylene glycol (MIRALAX) packet Take 17 g by mouth daily. 05/07/18   Jeanmarie Plant, MD  traMADol (ULTRAM) 50 MG tablet Take 1 tablet (50 mg total) by mouth  every 6 (six) hours as needed. 09/16/19 09/15/20  Emily Filbert, MD  triamcinolone cream (KENALOG) 0.1 % Apply 1 application topically 2 (two) times daily. (skin over the ankles and lower legs) 08/08/17   Lada, Janit Bern, MD  ranitidine (ZANTAC) 150 MG tablet TAKE 1 TABLET BY MOUTH AT BEDTIME FOR STOMACH Patient not taking: Reported on 08/28/2018 02/22/18 11/30/18  Kerman Passey, MD    Allergies Patient has no known allergies.  Family History  Problem Relation Age of Onset  . Stroke Mother   . Healthy Father     Social History Social History   Tobacco Use  . Smoking status: Never Smoker  . Smokeless tobacco: Never Used  . Tobacco comment: smoking cessation materials not required  Vaping Use  . Vaping Use: Never used  Substance Use Topics  . Alcohol use: No    Alcohol/week: 0.0 standard drinks  . Drug use: No    Review of Systems Constitutional: No fever/chills Eyes: No visual changes. ENT: No sore throat. Cardiovascular: Denies chest pain. Respiratory: Denies shortness of breath. Gastrointestinal: Positive for abdominal pain, constipation. Genitourinary: Negative for dysuria. Musculoskeletal: Negative for back pain. Skin: Negative for rash. Neurological: Negative for headaches, focal weakness or numbness.  ____________________________________________   PHYSICAL EXAM:  VITAL SIGNS: ED Triage Vitals  Enc Vitals Group     BP 01/10/20 1832 (!) 224/88     Pulse Rate 01/10/20 1832 80     Resp 01/10/20 1832 18     Temp 01/10/20 1832 98.5 F (36.9 C)     Temp Source 01/10/20 1832 Oral     SpO2 01/10/20 1826 94 %     Weight 01/10/20 1833 139 lb 15.9 oz (63.5 kg)     Height 01/10/20 1833 5\' 4"  (1.626 m)   Constitutional: Alert and oriented.  Eyes: Conjunctivae are normal.  ENT      Head: Normocephalic and atraumatic.      Nose: No congestion/rhinnorhea.      Mouth/Throat: Mucous membranes are moist.      Neck: No  stridor. Hematological/Lymphatic/Immunilogical: No cervical lymphadenopathy. Cardiovascular: Normal rate, regular rhythm.  No murmurs, rubs, or gallops.  Respiratory: Normal respiratory effort without tachypnea nor retractions. Breath sounds are clear and equal bilaterally. No wheezes/rales/rhonchi. Gastrointestinal: Soft and slightly tender, worse on the left side. No guarding, no rebound. Genitourinary: Deferred Musculoskeletal: Normal range of motion in all extremities. No lower extremity edema. Neurologic:  Normal speech and language. No gross focal neurologic deficits are appreciated.  Skin:  Skin is warm, dry and intact. No rash noted. Psychiatric: Mood and affect are normal. Speech and behavior are normal. Patient exhibits appropriate insight and judgment.  ____________________________________________    LABS (pertinent positives/negatives)  Lipase 33 CBC wbc 10.2, hgb 10.1, plt 229 CMP wnl except cr 1.03  ____________________________________________   EKG  None  ____________________________________________    RADIOLOGY  CT abd/pel Moderately dilated rectum with stool. New T12 compression  fracture.  ____________________________________________   PROCEDURES  Procedures  ____________________________________________   INITIAL IMPRESSION / ASSESSMENT AND PLAN / ED COURSE  Pertinent labs & imaging results that were available during my care of the patient were reviewed by me and considered in my medical decision making (see chart for details).   Presented to the emergency department today with concerns for constipation.  She states she has not had a bowel movement in 2 days.  On exam however patient did have some tenderness in the left side of her abdomen.  Did have concern for other intra-abdominal pathology so CT scan was obtained.  This did show some stool in the rectal vault did not show any other acute concerning intra-abdominal findings.  Did show perhaps new  compression fracture of the thoracic spine.  I discussed these findings with patient and family.  Shortly after the patient returned from CT she did have multiple large bowel movements.  This point will plan on discharging home.  Will give prescription for magnesium citrate as well as MiraLAX.  ____________________________________________   FINAL CLINICAL IMPRESSION(S) / ED DIAGNOSES  Final diagnoses:  Constipation, unspecified constipation type     Note: This dictation was prepared with Dragon dictation. Any transcriptional errors that result from this process are unintentional     Phineas Semen, MD 01/10/20 2236

## 2020-01-10 NOTE — ED Notes (Signed)
Patient godson Karen Davila) at bedside

## 2020-01-10 NOTE — ED Notes (Signed)
Patient has a hx of hypertension in right arm 236/86 in right arm 224/88 MD notified

## 2020-01-28 ENCOUNTER — Telehealth: Payer: Self-pay | Admitting: Family Medicine

## 2020-01-28 ENCOUNTER — Ambulatory Visit (INDEPENDENT_AMBULATORY_CARE_PROVIDER_SITE_OTHER): Payer: Medicare HMO | Admitting: Family Medicine

## 2020-01-28 ENCOUNTER — Other Ambulatory Visit: Payer: Self-pay

## 2020-01-28 ENCOUNTER — Encounter: Payer: Self-pay | Admitting: Family Medicine

## 2020-01-28 VITALS — BP 118/78 | HR 89 | Temp 98.0°F | Resp 16 | Ht 63.0 in | Wt 129.5 lb

## 2020-01-28 DIAGNOSIS — D649 Anemia, unspecified: Secondary | ICD-10-CM

## 2020-01-28 DIAGNOSIS — E039 Hypothyroidism, unspecified: Secondary | ICD-10-CM | POA: Diagnosis not present

## 2020-01-28 DIAGNOSIS — F0391 Unspecified dementia with behavioral disturbance: Secondary | ICD-10-CM | POA: Diagnosis not present

## 2020-01-28 DIAGNOSIS — I700 Atherosclerosis of aorta: Secondary | ICD-10-CM | POA: Diagnosis not present

## 2020-01-28 DIAGNOSIS — Z741 Need for assistance with personal care: Secondary | ICD-10-CM | POA: Diagnosis not present

## 2020-01-28 DIAGNOSIS — E038 Other specified hypothyroidism: Secondary | ICD-10-CM | POA: Diagnosis not present

## 2020-01-28 DIAGNOSIS — I10 Essential (primary) hypertension: Secondary | ICD-10-CM

## 2020-01-28 LAB — CBC WITH DIFFERENTIAL/PLATELET
Absolute Monocytes: 367 cells/uL (ref 200–950)
Basophils Absolute: 20 cells/uL (ref 0–200)
Basophils Relative: 0.4 %
Eosinophils Absolute: 97 cells/uL (ref 15–500)
Eosinophils Relative: 1.9 %
HCT: 30.4 % — ABNORMAL LOW (ref 35.0–45.0)
Hemoglobin: 9.4 g/dL — ABNORMAL LOW (ref 11.7–15.5)
Lymphs Abs: 1520 cells/uL (ref 850–3900)
MCH: 25.6 pg — ABNORMAL LOW (ref 27.0–33.0)
MCHC: 30.9 g/dL — ABNORMAL LOW (ref 32.0–36.0)
MCV: 82.8 fL (ref 80.0–100.0)
MPV: 11 fL (ref 7.5–12.5)
Monocytes Relative: 7.2 %
Neutro Abs: 3096 cells/uL (ref 1500–7800)
Neutrophils Relative %: 60.7 %
Platelets: 257 10*3/uL (ref 140–400)
RBC: 3.67 10*6/uL — ABNORMAL LOW (ref 3.80–5.10)
RDW: 14.5 % (ref 11.0–15.0)
Total Lymphocyte: 29.8 %
WBC: 5.1 10*3/uL (ref 3.8–10.8)

## 2020-01-28 LAB — COMPLETE METABOLIC PANEL WITH GFR
AG Ratio: 1.4 (calc) (ref 1.0–2.5)
ALT: 9 U/L (ref 6–29)
AST: 17 U/L (ref 10–35)
Albumin: 3.9 g/dL (ref 3.6–5.1)
Alkaline phosphatase (APISO): 57 U/L (ref 37–153)
BUN/Creatinine Ratio: 15 (calc) (ref 6–22)
BUN: 15 mg/dL (ref 7–25)
CO2: 27 mmol/L (ref 20–32)
Calcium: 9.1 mg/dL (ref 8.6–10.4)
Chloride: 105 mmol/L (ref 98–110)
Creat: 0.97 mg/dL — ABNORMAL HIGH (ref 0.60–0.88)
GFR, Est African American: 56 mL/min/{1.73_m2} — ABNORMAL LOW (ref >=60)
GFR, Est Non African American: 48 mL/min/{1.73_m2} — ABNORMAL LOW (ref >=60)
Globulin: 2.8 g/dL (calc) (ref 1.9–3.7)
Glucose, Bld: 101 mg/dL — ABNORMAL HIGH (ref 65–99)
Potassium: 4 mmol/L (ref 3.5–5.3)
Sodium: 141 mmol/L (ref 135–146)
Total Bilirubin: 0.3 mg/dL (ref 0.2–1.2)
Total Protein: 6.7 g/dL (ref 6.1–8.1)

## 2020-01-28 LAB — TSH: TSH: 40.59 mIU/L — ABNORMAL HIGH (ref 0.40–4.50)

## 2020-01-28 NOTE — Progress Notes (Signed)
Name: Karen Davila   MRN: [id number]    DOB: 15-Feb-1918   Date:01/28/2020       Progress Note  Chief Complaint  Patient presents with  . Referral    for home health     Subjective:   Karen Davila is a 86 y.o. female, presents for request for Home health assistance she presents with her god son,  [phone] - Karen Davila had a few episodes recently where she got confused and disoriented, for example the other day at church she felt lightheaded and did not know where she was for a short period of time.  She lives alone in an appt in senior living residence.  She manages her household, meds, meals, bathing, dressing etc.  Pt has not been in clinic for routine conditions since last December.  She has had multiple ER and hospital visits.  Hx of hypothyroid, looks like she would be out of meds, refills not continued after pcp left, only 90 d supply was sent in back in dec with note for pt to come into clinic for f/up.  F/up and labs have not been done in some time.  She had multiple visits to ER for abd pain and constipation.      Current Outpatient Medications:  .  amLODipine (NORVASC) 5 MG tablet, TAKE 1 & 1/2 (ONE & ONE-HALF) TABLETS BY MOUTH DAILY, Disp: 135 tablet, Rfl: 0 .  aspirin EC 81 MG tablet, Take 81 mg by mouth daily., Disp: , Rfl:  .  atorvastatin (LIPITOR) 10 MG tablet, Take 1 tablet (10 mg total) by mouth daily., Disp: 90 tablet, Rfl: 1 .  bisacodyl (DULCOLAX) 10 MG suppository, Place 1 suppository (10 mg total) rectally as needed for moderate constipation or severe constipation., Disp: 10 suppository, Rfl: 0 .  diclofenac sodium (VOLTAREN) 1 % GEL, Apply 2 g topically 4 (four) times daily., Disp: 150 g, Rfl: 1 .  docusate sodium (COLACE) 100 MG capsule, Take 1 capsule (100 mg total) by mouth 2 (two) times daily., Disp: 60 capsule, Rfl: 2 .  Elastic Bandages & Supports (TRUFORM STOCKINGS 20-30MMHG) MISC, Wear stockings, put on in the morning and remove  at night, Disp: 2 each, Rfl: 1 .  famotidine (PEPCID) 20 MG tablet, Take 1 tablet (20 mg total) by mouth daily., Disp: 90 tablet, Rfl: 0 .  hydrocortisone (ANUSOL-HC) 2.5 % rectal cream, Place 1 application rectally 2 (two) times daily., Disp: 30 g, Rfl: 0 .  hydrocortisone cream 0.5 %, Apply 1 application topically 2 (two) times daily., Disp: 30 g, Rfl: 0 .  lactulose (CHRONULAC) 10 GM/15ML solution, Take 30 mLs (20 g total) by mouth daily as needed for mild constipation., Disp: 120 mL, Rfl: 0 .  levothyroxine (SYNTHROID) 50 MCG tablet, TAKE 1 TABLET BY MOUTH ONCE DAILY BEFORE BREAKFAST, Disp: 90 tablet, Rfl: 0 .  Lidocaine 0.5 % GEL, Apply 1 application topically daily., Disp: 1 Tube, Rfl: 0 .  lisinopril (ZESTRIL) 20 MG tablet, Take 1 tablet (20 mg total) by mouth daily., Disp: 90 tablet, Rfl: 0 .  Olopatadine HCl 0.2 % SOLN, Apply 1 drop to eye every morning., Disp: 1 Bottle, Rfl: 0 .  polyethylene glycol (MIRALAX) 17 g packet, Take 17 g by mouth daily., Disp: 14 each, Rfl: 0 .  polyethylene glycol (MIRALAX) packet, Take 17 g by mouth daily., Disp: 14 each, Rfl: 0 .  traMADol (ULTRAM) 50 MG tablet, Take 1 tablet (50 mg total) by mouth every  6 (six) hours as needed., Disp: 20 tablet, Rfl: 0 .  triamcinolone cream (KENALOG) 0.1 %, Apply 1 application topically 2 (two) times daily. (skin over the ankles and lower legs), Disp: 80 g, Rfl: 1  Patient Active Problem List   Diagnosis Date Noted  . Anemia 06/15/2018  . Atherosclerosis of aorta (HCC) 05/16/2018  . Venous insufficiency 08/08/2017  . Lichen simplex chronicus 08/08/2017  . Swelling of limb 05/30/2017  . Left arm pain 08/30/2016  . Bilateral leg cramps 07/06/2016  . Chronic kidney disease, stage 3 07/07/2015  . Cardiac murmur, previously undiagnosed 04/06/2015  . Dementia (HCC) 04/06/2015  . Difficulty hearing 04/06/2015  . OP (osteoporosis) 04/06/2015  . Hypertension 12/23/2014  . GERD (gastroesophageal reflux disease) 12/23/2014   . Adult hypothyroidism 12/23/2014  . Hyperlipidemia 12/23/2014  . Iron deficiency anemia 12/23/2014    Past Surgical History:  Procedure Laterality Date  . EYE SURGERY Right    Cataract    Family History  Problem Relation Age of Onset  . Stroke Mother   . Healthy Father     Social History   Tobacco Use  . Smoking status: Never Smoker  . Smokeless tobacco: Never Used  . Tobacco comment: smoking cessation materials not required  Vaping Use  . Vaping Use: Never used  Substance Use Topics  . Alcohol use: No    Alcohol/week: 0.0 standard drinks  . Drug use: No     No Known Allergies  Health Maintenance  Topic Date Due  . COVID-19 Vaccine (1) Never done  . TETANUS/TDAP  06/13/2026 (Originally 10/12/1936)  . PNA vac Low Risk Adult (1 of 2 - PCV13) 06/13/2026 (Originally 10/11/1984)  . DEXA SCAN  Completed  . INFLUENZA VACCINE  Discontinued    Chart Review Today: I personally reviewed active problem list, medication list, allergies, family history, social history, health maintenance, notes from last encounter, lab results, imaging with the patient/caregiver today.  Review of Systems  Constitutional: Negative.  Negative for activity change, appetite change, fatigue and unexpected weight change.  HENT: Negative.   Eyes: Negative.   Respiratory: Negative.  Negative for shortness of breath.   Cardiovascular: Negative.  Negative for chest pain, palpitations and leg swelling.  Gastrointestinal: Negative.  Negative for abdominal pain and blood in stool.  Endocrine: Negative.   Genitourinary: Negative.   Musculoskeletal: Negative.  Negative for arthralgias, gait problem, joint swelling and myalgias.  Skin: Negative.  Negative for pallor and rash.  Allergic/Immunologic: Negative.   Neurological: Negative.  Negative for syncope and weakness.  Hematological: Negative.   Psychiatric/Behavioral: Negative.  Negative for dysphoric mood, self-injury and suicidal ideas. The patient is  not nervous/anxious.   All other systems reviewed and are negative.    Objective:   Vitals:   01/28/20 1511  Pulse: 89  Resp: 16  Temp: 98 F (36.7 C)  SpO2: 96%  Weight: 129 lb 8 oz (58.7 kg)  Height: 5\' 3"  (1.6 m)    Body mass index is 22.94 kg/m.  Physical Exam Vitals and nursing note reviewed.  Constitutional:      General: She is not in acute distress.    Appearance: Normal appearance. She is well-developed and well-groomed. She is not ill-appearing, toxic-appearing or diaphoretic.     Interventions: Face mask in place.     Comments: Elderly female HOH, pleasant  HENT:     Head: Normocephalic and atraumatic.     Right Ear: External ear normal. Decreased hearing noted.     Left  Ear: External ear normal. Decreased hearing noted.  Eyes:     General: Lids are normal. No scleral icterus.       Right eye: No discharge.        Left eye: No discharge.     Conjunctiva/sclera: Conjunctivae normal.  Neck:     Trachea: Phonation normal. No tracheal deviation.  Cardiovascular:     Rate and Rhythm: Normal rate and regular rhythm.     Pulses: Normal pulses.          Radial pulses are 2+ on the right side and 2+ on the left side.       Posterior tibial pulses are 2+ on the right side and 2+ on the left side.     Heart sounds: Normal heart sounds. No murmur heard.  No friction rub. No gallop.   Pulmonary:     Effort: Pulmonary effort is normal. No respiratory distress.     Breath sounds: Normal breath sounds. No stridor. No wheezing, rhonchi or rales.  Chest:     Chest wall: No tenderness.  Abdominal:     General: Bowel sounds are normal. There is no distension.     Palpations: Abdomen is soft.  Musculoskeletal:     Right lower leg: No edema.     Left lower leg: No edema.  Skin:    General: Skin is warm and dry.     Coloration: Skin is not jaundiced or pale.     Findings: No rash.  Neurological:     Mental Status: She is alert.     Cranial Nerves: No facial  asymmetry.     Motor: No abnormal muscle tone.     Comments: Uses rolling walker to get up from chair and to ambulate  Psychiatric:        Mood and Affect: Mood normal.        Speech: Speech normal.        Behavior: Behavior normal. Behavior is cooperative.          Assessment & Plan:     ICD-10-CM   1. Requires assistance with activities of daily living (ADL)  Z74.1 Ambulatory referral to Chronic Care Management Services    Ambulatory referral to Home Health   see if [facility] assessment will be able to assess pts function and need for assistance  2. Other specified hypothyroidism  E03.8 CBC with Differential/Platelet    COMPLETE METABOLIC PANEL WITH GFR    TSH   overdue for labs, ran out of meds  3. Adult hypothyroidism  E03.9 CBC with Differential/Platelet    COMPLETE METABOLIC PANEL WITH GFR    TSH   unfortunately got lost to f/up with PCP going on maternity leave, recheck labs, get pt back on meds   4. Essential hypertension  I10 COMPLETE METABOLIC PANEL WITH GFR   BP well controlled on norvasc and lisinopril, currently asx, pt has no concerns about BP meds  5. Anemia, unspecified type  D64.9 CBC with Differential/Platelet   hx of anemia - hemaglobin 9-10 baseline, recheck labs  6. Atherosclerosis of aorta (HCC)  I70.0 COMPLETE METABOLIC PANEL WITH GFR   on statin, tolerating w/o myalgias, discussed with pt and Casimiro Needle how she may be able to decrease meds and avoid possible SE  7. Dementia with behavioral disturbance, unspecified dementia type (HCC)  F03.91    noted on chart - unknown degree of dementia, pt lives a lone, very independent in the past, some recent worsening of memory/cognition  Return in about 1 month (around 02/28/2020) for Routine follow-up in person please.   Danelle BerryLeisa Summerlyn Fickel, PA-C 01/28/20 3:17 PM

## 2020-01-28 NOTE — Chronic Care Management (AMB) (Signed)
  Chronic Care Management   Note  01/28/2020 Name: Karen Davila MRN: [id number] DOB: 04/12/1918  Karen Davila is a 86 y.o. year old female who is a primary care patient of Delsa Grana, [place]. I reached out to [employer] by phone today in response to a referral sent by [name]'s PCP, Delsa Grana PA-C     [name] was given information about Chronic Care Management services today including:  1. CCM service includes personalized support from designated clinical staff supervised by her physician, including individualized plan of care and coordination with other care providers 2. 24/7 contact phone numbers for assistance for urgent and routine care needs. 3. Service will only be billed when office clinical staff spend 20 minutes or more in a month to coordinate care. 4. Only one practitioner may furnish and bill the service in a calendar month. 5. The patient may stop CCM services at any time (effective at the end of the month) by phone call to the office staff. 6. The patient will be responsible for cost sharing (co-pay) of up to 20% of the service fee (after annual deductible is met).  Patient's son Legrand Como  agreed to services and verbal consent obtained.   Follow up plan: Telephone appointment with care management team member scheduled for:02/03/2020  Noreene Larsson, [place], [place], [place] [address] Direct Dial: [phone] [email or web address]: [email or web address]

## 2020-01-29 MED ORDER — LEVOTHYROXINE SODIUM 50 MCG PO TABS
50.0000 ug | ORAL_TABLET | Freq: Every day | ORAL | 3 refills | Status: DC
Start: 2020-02-06 — End: 2020-06-10

## 2020-01-29 MED ORDER — LEVOTHYROXINE SODIUM 25 MCG PO TABS: 25.0000 ug | ORAL_TABLET | Freq: Every day | ORAL | 0 refills | Status: DC

## 2020-02-03 ENCOUNTER — Telehealth: Payer: Medicare HMO

## 2020-02-03 ENCOUNTER — Ambulatory Visit (INDEPENDENT_AMBULATORY_CARE_PROVIDER_SITE_OTHER): Payer: Medicare HMO | Admitting: *Deleted

## 2020-02-03 DIAGNOSIS — Z741 Need for assistance with personal care: Secondary | ICD-10-CM

## 2020-02-03 DIAGNOSIS — F0391 Unspecified dementia with behavioral disturbance: Secondary | ICD-10-CM

## 2020-02-03 NOTE — Chronic Care Management (AMB) (Signed)
Chronic Care Management    Clinical Social Work Follow Up Note  02/03/2020 Name: Karen Davila MRN: [id number] DOB: 1917/10/25  Karen Davila is a 86 y.o. year old female who is a primary care patient of [name], [place]. The CCM team was consulted for assistance with [employer] .   Review of patient status, including review of consultants reports, other relevant assessments, and collaboration with appropriate care team members and the patient's provider was performed as part of comprehensive patient evaluation and provision of chronic care management services.    SDOH (Social Determinants of Health) assessments performed: Yes    Outpatient Encounter Medications as of 02/03/2020  Medication Sig  . amLODipine (NORVASC) 5 MG tablet TAKE 1 & 1/2 (ONE & ONE-HALF) TABLETS BY MOUTH DAILY  . aspirin EC 81 MG tablet Take 81 mg by mouth daily.  [name] atorvastatin (LIPITOR) 10 MG tablet Take 1 tablet (10 mg total) by mouth daily.  . bisacodyl (DULCOLAX) 10 MG suppository Place 1 suppository (10 mg total) rectally as needed for moderate constipation or severe constipation.  . diclofenac sodium (VOLTAREN) 1 % GEL Apply 2 g topically 4 (four) times daily.  [name] docusate sodium (COLACE) 100 MG capsule Take 1 capsule (100 mg total) by mouth 2 (two) times daily.  . Elastic Bandages & Supports (TRUFORM STOCKINGS 20-30MMHG) MISC Wear stockings, put on in the morning and remove at night  . famotidine (PEPCID) 20 MG tablet Take 1 tablet (20 mg total) by mouth daily.  . hydrocortisone (ANUSOL-HC) 2.5 % rectal cream Place 1 application rectally 2 (two) times daily.  . hydrocortisone cream 0.5 % Apply 1 application topically 2 (two) times daily.  [name] lactulose (CHRONULAC) 10 GM/15ML solution Take 30 mLs (20 g total) by mouth daily as needed for mild constipation.  [name] levothyroxine (SYNTHROID) 25 MCG tablet Take 1 tablet (25 mcg total) by mouth daily before breakfast for 7 days.  [name] levothyroxine (SYNTHROID) 50  MCG tablet TAKE 1 TABLET BY MOUTH ONCE DAILY BEFORE BREAKFAST  . [START ON 02/06/2020] levothyroxine (SYNTHROID) 50 MCG tablet Take 1 tablet (50 mcg total) by mouth daily.  . Lidocaine 0.5 % GEL Apply 1 application topically daily.  [name] lisinopril (ZESTRIL) 20 MG tablet Take 1 tablet (20 mg total) by mouth daily.  . Olopatadine HCl 0.2 % SOLN Apply 1 drop to eye every morning.  . polyethylene glycol (MIRALAX) 17 g packet Take 17 g by mouth daily.  . polyethylene glycol (MIRALAX) packet Take 17 g by mouth daily.  . traMADol (ULTRAM) 50 MG tablet Take 1 tablet (50 mg total) by mouth every 6 (six) hours as needed.  . triamcinolone cream (KENALOG) 0.1 % Apply 1 application topically 2 (two) times daily. (skin over the ankles and lower legs)  . [DISCONTINUED] ranitidine (ZANTAC) 150 MG tablet TAKE 1 TABLET BY MOUTH AT BEDTIME FOR STOMACH (Patient not taking: Reported on 08/28/2018)   No facility-administered encounter medications on file as of 02/03/2020.     Goals Addressed              This Visit's Progress   .  "My mother needs more help in the home" (pt-stated)        CARE PLAN ENTRY (see longitudinal plan of care for additional care plan information)  Current Barriers:  . ADL IADL limitations  Clinical Social Work Clinical Goal(s):  [name] Over the next 90 days, patient will follow up with the in home aid program of choice* as  directed by SW  Interventions: . Patient's son interviewed and appropriate assessments performed . Patient's son confirmed that patient resides in the [facility] . Patient confirmed to be very independent however needs oversight with regard to medication reminders, ADL's/IADL's and light housekeeping . Patient's son visit's regularly, assist with transportation and IADL's but due to work schedule needs assistance with her care . Provided patient with information about referral for [facility] that is covered by insurance and confirmed that  private duty custodial care would be an out of pocket expense . Confirmed plan to provide patient's son with a list of providers to review for possible in home assistance . Advised patient's son to also consult the resident advisor through [facility] to discuss options as well  Patient Self Care Activities:  . Attends all scheduled provider appointments . Attends church or other social activities . Unable to perform ADLs independently . Unable to perform IADLs independently  Initial goal documentation         Follow Up Plan: SW will follow up with patient by phone over the next 7-14 days   Curry Seefeldt, LCSW Clinical Social Worker  [facility] Care Management [phone]

## 2020-02-03 NOTE — Patient Instructions (Signed)
Thank you allowing the Chronic Care Management Team to be a part of your care! It was a pleasure speaking with you today!  1. This Child psychotherapist to provide Administrator, Civil Service for private duty aids  CCM (Chronic Care Management) Team   Juanell Fairly RN, BSN Nurse Care Coordinator  [phone]  Sherod Cisse, LCSW Clinical Social Worker [phone]  Goals Addressed              This Visit's Progress     "My mother needs more help in the home" (pt-stated)        CARE PLAN ENTRY (see longitudinal plan of care for additional care plan information)  Current Barriers:   ADL IADL limitations  Clinical Social Work Clinical Goal(s):   Over the next 90 days, patient will follow up with the in home aid program of choice* as directed by SW  Interventions:  Patient's son interviewed and appropriate assessments performed  Patient's son confirmed that patient resides in the [facility]  Patient confirmed to be very independent however needs oversight with regard to medication reminders, ADL's/IADL's and light housekeeping  Patient's son visit's regularly, assist with transportation and IADL's but due to work schedule needs assistance with her care  Provided patient with information about referral for [facility] that is covered by insurance and confirmed that private duty custodial care would be an out of pocket expense  Confirmed plan to provide patient's son with a list of providers to review for possible in home assistance  Advised patient's son to also consult the resident advisor through [facility] to discuss options as well  Patient Self Care Activities:   Attends all scheduled provider appointments  Attends church or other social activities  Unable to perform ADLs independently  Unable to perform IADLs independently  Initial goal documentation         The patient verbalized understanding of instructions provided  today and agreed to receive a mailed copy of patient instruction and/or educational materials.  Telephone follow up appointment with care management team member scheduled for:  02/10/20

## 2020-02-10 ENCOUNTER — Ambulatory Visit: Payer: Self-pay | Admitting: *Deleted

## 2020-02-10 DIAGNOSIS — F0391 Unspecified dementia with behavioral disturbance: Secondary | ICD-10-CM

## 2020-02-10 DIAGNOSIS — Z741 Need for assistance with personal care: Secondary | ICD-10-CM

## 2020-02-10 NOTE — Chronic Care Management (AMB) (Signed)
Chronic Care Management    Clinical Social Work Follow Up Note  02/10/2020 Name: Karen Davila MRN: [id number] DOB: [birth year]  Karen Davila is a 86 y.o. year old female who is a primary care patient of [name], [place]. The CCM team was consulted for assistance with [employer] .   Review of patient status, including review of consultants reports, other relevant assessments, and collaboration with appropriate care team members and the patient's provider was performed as part of comprehensive patient evaluation and provision of chronic care management services.    SDOH (Social Determinants of Health) assessments performed: No    Outpatient Encounter Medications as of 02/10/2020  Medication Sig  . amLODipine (NORVASC) 5 MG tablet TAKE 1 & 1/2 (ONE & ONE-HALF) TABLETS BY MOUTH DAILY  . aspirin EC 81 MG tablet Take 81 mg by mouth daily.  [name] atorvastatin (LIPITOR) 10 MG tablet Take 1 tablet (10 mg total) by mouth daily.  . bisacodyl (DULCOLAX) 10 MG suppository Place 1 suppository (10 mg total) rectally as needed for moderate constipation or severe constipation.  . diclofenac sodium (VOLTAREN) 1 % GEL Apply 2 g topically 4 (four) times daily.  [name] docusate sodium (COLACE) 100 MG capsule Take 1 capsule (100 mg total) by mouth 2 (two) times daily.  . Elastic Bandages & Supports (TRUFORM STOCKINGS 20-30MMHG) MISC Wear stockings, put on in the morning and remove at night  . famotidine (PEPCID) 20 MG tablet Take 1 tablet (20 mg total) by mouth daily.  . hydrocortisone (ANUSOL-HC) 2.5 % rectal cream Place 1 application rectally 2 (two) times daily.  . hydrocortisone cream 0.5 % Apply 1 application topically 2 (two) times daily.  [name] lactulose (CHRONULAC) 10 GM/15ML solution Take 30 mLs (20 g total) by mouth daily as needed for mild constipation.  [name] levothyroxine (SYNTHROID) 25 MCG tablet Take 1 tablet (25 mcg total) by mouth daily before breakfast for 7 days.  [name] levothyroxine (SYNTHROID) 50 MCG  tablet TAKE 1 TABLET BY MOUTH ONCE DAILY BEFORE BREAKFAST  . levothyroxine (SYNTHROID) 50 MCG tablet Take 1 tablet (50 mcg total) by mouth daily.  . Lidocaine 0.5 % GEL Apply 1 application topically daily.  [name] lisinopril (ZESTRIL) 20 MG tablet Take 1 tablet (20 mg total) by mouth daily.  . Olopatadine HCl 0.2 % SOLN Apply 1 drop to eye every morning.  . polyethylene glycol (MIRALAX) 17 g packet Take 17 g by mouth daily.  . polyethylene glycol (MIRALAX) packet Take 17 g by mouth daily.  . traMADol (ULTRAM) 50 MG tablet Take 1 tablet (50 mg total) by mouth every 6 (six) hours as needed.  . triamcinolone cream (KENALOG) 0.1 % Apply 1 application topically 2 (two) times daily. (skin over the ankles and lower legs)  . [DISCONTINUED] ranitidine (ZANTAC) 150 MG tablet TAKE 1 TABLET BY MOUTH AT BEDTIME FOR STOMACH (Patient not taking: Reported on 08/28/2018)   No facility-administered encounter medications on file as of 02/10/2020.     Goals Addressed              This Visit's Progress   .  "My mother needs more help in the home" (pt-stated)        CARE PLAN ENTRY (see longitudinal plan of care for additional care plan information)  Current Barriers:  . ADL IADL limitations  Clinical Social Work Clinical Goal(s):  [name] Over the next 90 days, patient will follow up with the in home aid program of choice* as directed by SW  Interventions: . Follow up phone call to patient's son to discuss private duty aid options for in home care . Per patient's son, he has discussed private duty care with patient and she has agreed . Private duty option discussed with patient's son, he will contact agency of choice for follow up:  Patient Self Care Activities:  . Attends all scheduled provider appointments . Attends church or other social activities . Unable to perform ADLs independently . Unable to perform IADLs independently  Please see past updates related to this goal by clicking on the "Past Updates"  button in the selected goal          Follow Up Plan: SW will follow up with patient's son by phone over the next 7-10 business days regarding status of in home care   [place], [place] Clinical Social Worker  [facility] Care Management [phone]

## 2020-02-10 NOTE — Patient Instructions (Addendum)
Thank you allowing the Chronic Care Management Team to be a part of your care! It was a pleasure speaking with you today!  1. Please contact private duty aid agency of choice to discuss in home care options  CCM (Chronic Care Management) Team   Juanell Fairly RN, BSN Nurse Care Coordinator  [phone]   Denzil Mceachron [address] LCSW Clinical Social Worker [phone]  Goals Addressed              This Visit's Progress   .  "My mother needs more help in the home" (pt-stated)        CARE PLAN ENTRY (see longitudinal plan of care for additional care plan information)  Current Barriers:  . ADL IADL limitations  Clinical Social Work Clinical Goal(s):  [name] Over the next 90 days, patient will follow up with the in home aid program of choice* as directed by SW  Interventions: . Follow up phone call to patient's son to discuss private duty aid options for in home care . Per patient's son, he has discussed private duty care with patient and she has agreed . Private duty option discussed with patient's son, he will contact agency of choice for follow up:  Patient Self Care Activities:  . Attends all scheduled provider appointments . Attends church or other social activities . Unable to perform ADLs independently . Unable to perform IADLs independently  Please see past updates related to this goal by clicking on the "Past Updates" button in the selected goal          The patient verbalized understanding of instructions provided today and declined a print copy of patient instruction materials.   Telephone follow up appointment with care management team member scheduled for:  02/19/20

## 2020-02-10 NOTE — Chronic Care Management (AMB) (Signed)
Chronic Care Management    Clinical Social Work Follow Up Note  02/10/2020 Name: Karen Davila MRN: [id number] DOB: [birth year]  Karen Davila is a 86 y.o. year old female who is a primary care patient of [name], [place]. The CCM team was consulted for assistance with [employer] .   Review of patient status, including review of consultants reports, other relevant assessments, and collaboration with appropriate care team members and the patient's provider was performed as part of comprehensive patient evaluation and provision of chronic care management services.    SDOH (Social Determinants of Health) assessments performed: No    Outpatient Encounter Medications as of 02/10/2020  Medication Sig  . amLODipine (NORVASC) 5 MG tablet TAKE 1 & 1/2 (ONE & ONE-HALF) TABLETS BY MOUTH DAILY  . aspirin EC 81 MG tablet Take 81 mg by mouth daily.  [name] atorvastatin (LIPITOR) 10 MG tablet Take 1 tablet (10 mg total) by mouth daily.  . bisacodyl (DULCOLAX) 10 MG suppository Place 1 suppository (10 mg total) rectally as needed for moderate constipation or severe constipation.  . diclofenac sodium (VOLTAREN) 1 % GEL Apply 2 g topically 4 (four) times daily.  [name] docusate sodium (COLACE) 100 MG capsule Take 1 capsule (100 mg total) by mouth 2 (two) times daily.  . Elastic Bandages & Supports (TRUFORM STOCKINGS 20-30MMHG) MISC Wear stockings, put on in the morning and remove at night  . famotidine (PEPCID) 20 MG tablet Take 1 tablet (20 mg total) by mouth daily.  . hydrocortisone (ANUSOL-HC) 2.5 % rectal cream Place 1 application rectally 2 (two) times daily.  . hydrocortisone cream 0.5 % Apply 1 application topically 2 (two) times daily.  [name] lactulose (CHRONULAC) 10 GM/15ML solution Take 30 mLs (20 g total) by mouth daily as needed for mild constipation.  [name] levothyroxine (SYNTHROID) 25 MCG tablet Take 1 tablet (25 mcg total) by mouth daily before breakfast for 7 days.  [name] levothyroxine (SYNTHROID) 50 MCG  tablet TAKE 1 TABLET BY MOUTH ONCE DAILY BEFORE BREAKFAST  . levothyroxine (SYNTHROID) 50 MCG tablet Take 1 tablet (50 mcg total) by mouth daily.  . Lidocaine 0.5 % GEL Apply 1 application topically daily.  [name] lisinopril (ZESTRIL) 20 MG tablet Take 1 tablet (20 mg total) by mouth daily.  . Olopatadine HCl 0.2 % SOLN Apply 1 drop to eye every morning.  . polyethylene glycol (MIRALAX) 17 g packet Take 17 g by mouth daily.  . polyethylene glycol (MIRALAX) packet Take 17 g by mouth daily.  . traMADol (ULTRAM) 50 MG tablet Take 1 tablet (50 mg total) by mouth every 6 (six) hours as needed.  . triamcinolone cream (KENALOG) 0.1 % Apply 1 application topically 2 (two) times daily. (skin over the ankles and lower legs)  . [DISCONTINUED] ranitidine (ZANTAC) 150 MG tablet TAKE 1 TABLET BY MOUTH AT BEDTIME FOR STOMACH (Patient not taking: Reported on 08/28/2018)   No facility-administered encounter medications on file as of 02/10/2020.     Goals Addressed              This Visit's Progress   .  "My mother needs more help in the home" (pt-stated)        CARE PLAN ENTRY (see longitudinal plan of care for additional care plan information)  Current Barriers:  . ADL IADL limitations  Clinical Social Work Clinical Goal(s):  [name] Over the next 90 days, patient will follow up with the in home aid program of choice* as directed by SW  Interventions: [name] Multiple phone calls to private duty agencies to discuss private duty options/prices: . [phone] . [facility] [phone] . [facility] [phone] . [facility] [phone] . [facility] [phone]  Patient Self Care Activities:  . Attends all scheduled provider appointments . Attends church or other social activities . Unable to perform ADLs independently . Unable to perform IADLs independently  Please see past updates related to this goal by clicking on the "Past Updates" button in the selected goal           Follow Up Plan: SW will follow up with patient's son by phone over the next 7-10 business days regarding private duty aid identified   Karen Czech, LCSW Clinical Social Worker  [facility] Care Management [phone]

## 2020-02-19 ENCOUNTER — Telehealth: Payer: Self-pay

## 2020-02-20 ENCOUNTER — Telehealth: Payer: Self-pay

## 2020-02-21 ENCOUNTER — Ambulatory Visit: Payer: Self-pay | Admitting: *Deleted

## 2020-02-21 ENCOUNTER — Telehealth: Payer: Self-pay | Admitting: *Deleted

## 2020-02-21 DIAGNOSIS — F0391 Unspecified dementia with behavioral disturbance: Secondary | ICD-10-CM

## 2020-02-21 DIAGNOSIS — Z741 Need for assistance with personal care: Secondary | ICD-10-CM

## 2020-02-21 NOTE — Patient Instructions (Signed)
Thank you allowing the Chronic Care Management Team to be a part of your care! It was a pleasure speaking with you today!  1. Please call this social worker with any additional community resource needs in the future.  CCM (Chronic Care Management) Team   Juanell Fairly  RN, BSN Nurse Care Coordinator  [phone]  [place], LCSW Clinical Social Worker [phone]  Goals Addressed              This Visit's Progress     "My mother needs more help in the home" (pt-stated)        CARE PLAN ENTRY (see longitudinal plan of care for additional care plan information)  Current Barriers:   ADL IADL limitations  Clinical Social Work Clinical Goal(s):   Over the next 90 days, patient will follow up with the in home aid program of choice* as directed by SW  Interventions:  Follow up phone call to patient's son to discuss private duty aid options for in home care  Per patient's son, he has discussed private duty care with patient and they have now decided against hiring a in home aid  Per patient's son, he will increase visits to her home as well as bringing patient to his home more often for closer monitoring  Patient's son dicussed that he increase visits for a while and then re-visit a higher level of care at that time  Private duty option discussed with patient's son, he will contact agency of choice for follow up if needed in the future.  Patient Self Care Activities:   Attends all scheduled provider appointments  Attends church or other social activities  Unable to perform ADLs independently  Unable to perform IADLs independently  Please see past updates related to this goal by clicking on the "Past Updates" button in the selected goal          The patient verbalized understanding of instructions provided today and declined a print copy of patient instruction materials.   No further follow up required: community resources provided for in home aid  care for future use if needed

## 2020-02-21 NOTE — Chronic Care Management (AMB) (Signed)
Chronic Care Management    Clinical Social Work Follow Up Note  02/21/2020 Name: DARENDA FIKE MRN: [id number] DOB: 05-28-1918  [name] is a 86 y.o. year old female who is a primary care patient of [name], [place]. The CCM team was consulted for assistance with [employer] .   Review of patient status, including review of consultants reports, other relevant assessments, and collaboration with appropriate care team members and the patient's provider was performed as part of comprehensive patient evaluation and provision of chronic care management services.    SDOH (Social Determinants of Health) assessments performed: No    Outpatient Encounter Medications as of 02/21/2020  Medication Sig  . amLODipine (NORVASC) 5 MG tablet TAKE 1 & 1/2 (ONE & ONE-HALF) TABLETS BY MOUTH DAILY  . aspirin EC 81 MG tablet Take 81 mg by mouth daily.  [name] atorvastatin (LIPITOR) 10 MG tablet Take 1 tablet (10 mg total) by mouth daily.  . bisacodyl (DULCOLAX) 10 MG suppository Place 1 suppository (10 mg total) rectally as needed for moderate constipation or severe constipation.  . diclofenac sodium (VOLTAREN) 1 % GEL Apply 2 g topically 4 (four) times daily.  [name] docusate sodium (COLACE) 100 MG capsule Take 1 capsule (100 mg total) by mouth 2 (two) times daily.  . Elastic Bandages & Supports (TRUFORM STOCKINGS 20-30MMHG) MISC Wear stockings, put on in the morning and remove at night  . famotidine (PEPCID) 20 MG tablet Take 1 tablet (20 mg total) by mouth daily.  . hydrocortisone (ANUSOL-HC) 2.5 % rectal cream Place 1 application rectally 2 (two) times daily.  . hydrocortisone cream 0.5 % Apply 1 application topically 2 (two) times daily.  [name] lactulose (CHRONULAC) 10 GM/15ML solution Take 30 mLs (20 g total) by mouth daily as needed for mild constipation.  [name] levothyroxine (SYNTHROID) 25 MCG tablet Take 1 tablet (25 mcg total) by mouth daily before breakfast for 7 days.  [name] levothyroxine (SYNTHROID) 50 MCG  tablet TAKE 1 TABLET BY MOUTH ONCE DAILY BEFORE BREAKFAST  . levothyroxine (SYNTHROID) 50 MCG tablet Take 1 tablet (50 mcg total) by mouth daily.  . Lidocaine 0.5 % GEL Apply 1 application topically daily.  [name] lisinopril (ZESTRIL) 20 MG tablet Take 1 tablet (20 mg total) by mouth daily.  . Olopatadine HCl 0.2 % SOLN Apply 1 drop to eye every morning.  . polyethylene glycol (MIRALAX) 17 g packet Take 17 g by mouth daily.  . polyethylene glycol (MIRALAX) packet Take 17 g by mouth daily.  . traMADol (ULTRAM) 50 MG tablet Take 1 tablet (50 mg total) by mouth every 6 (six) hours as needed.  . triamcinolone cream (KENALOG) 0.1 % Apply 1 application topically 2 (two) times daily. (skin over the ankles and lower legs)  . [DISCONTINUED] ranitidine (ZANTAC) 150 MG tablet TAKE 1 TABLET BY MOUTH AT BEDTIME FOR STOMACH (Patient not taking: Reported on 08/28/2018)   No facility-administered encounter medications on file as of 02/21/2020.     Goals Addressed              This Visit's Progress   .  "My mother needs more help in the home" (pt-stated)        CARE PLAN ENTRY (see longitudinal plan of care for additional care plan information)  Current Barriers:  . ADL IADL limitations  Clinical Social Work Clinical Goal(s):  [name] Over the next 90 days, patient will follow up with the in home aid program of choice* as directed by SW  Interventions: . Follow up phone call to patient's son to discuss private duty aid options for in home care . Per patient's son, he has discussed private duty care with patient and they have now decided against hiring a in home aid . Per patient's son, he will increase visits to her home as well as bringing patient to his home more often for closer monitoring . Patient's son dicussed that he increase visits for a while and then re-visit a higher level of care at that time . Private duty option discussed with patient's son, he will contact agency of choice for follow up if  needed in the future.  Patient Self Care Activities:  . Attends all scheduled provider appointments . Attends church or other social activities . Unable to perform ADLs independently . Unable to perform IADLs independently  Please see past updates related to this goal by clicking on the "Past Updates" button in the selected goal          Follow Up Plan: patient's son will call this social worker if  additonal community resource needs arise in the future   [place], LCSW Clinical Social Worker  [facility] Care Management [phone]

## 2020-02-21 NOTE — Telephone Encounter (Signed)
    Care Management   Unsuccessful Call Note 02/21/2020 Name: Karen Davila MRN: [id number] DOB: May 14, 1918  Patient is a 86 year old female who sees [name], [place] for primary care. [name], PA-C asked the CCM team to consult the patient for community care.    This social worker was unable to reach patient's son via telephone today for follow up call regarding community resources provided. I have left HIPAA compliant voicemail asking patient's son  to return my call. (unsuccessful outreach #[phone]).   Plan: Will follow-up within 7 business days via telephone.     Verna Czech, LCSW [facility] [phone]

## 2020-03-09 ENCOUNTER — Ambulatory Visit (INDEPENDENT_AMBULATORY_CARE_PROVIDER_SITE_OTHER): Payer: Medicare HMO | Admitting: Family Medicine

## 2020-03-09 ENCOUNTER — Other Ambulatory Visit: Payer: Self-pay

## 2020-03-09 ENCOUNTER — Encounter: Payer: Self-pay | Admitting: Family Medicine

## 2020-03-09 VITALS — BP 118/72 | HR 81 | Temp 99.1°F | Resp 14 | Ht 63.0 in | Wt 129.3 lb

## 2020-03-09 DIAGNOSIS — E038 Other specified hypothyroidism: Secondary | ICD-10-CM

## 2020-03-09 LAB — TSH: TSH: 1.8 mIU/L (ref 0.40–4.50)

## 2020-03-09 NOTE — Progress Notes (Signed)
Name: Karen Davila   MRN: [id number]    DOB: July 20, 1917   Date:03/09/2020       Progress Note  Chief Complaint  Patient presents with  . Follow-up  . Hypothyroidism     Subjective:   Karen Davila is a 86 y.o. female, presents to clinic for follow up on abnormal TSH Pt had run out of meds from prior PCP and did not realize it Her [name] brought her in for f/up on confusion and sh ewas found to have very high TSH She was put back on meds Other labs showed stable CKD and anemia She had unfortunately been to the ER many times for various issues over the past 6-9 months, including constipation (may  Have been related to chemical hypothyroid)  Hypothyroidism: Current Medication Regimen: restarted her back on 25 mcg daily x a week, then increased back up to her normal prior dose of 50 mcg q d Takes medicine daily in the morning Current Symptoms: denies fatigue, weight changes, heat/cold intolerance, bowel/skin changes or CVS symptoms Most recent results are below; we will be repeating labs today. Lab Results  Component Value Date   TSH 40.59 (H) 01/28/2020      Current Outpatient Medications:  .  amLODipine (NORVASC) 5 MG tablet, TAKE 1 & 1/2 (ONE & ONE-HALF) TABLETS BY MOUTH DAILY, Disp: 135 tablet, Rfl: 0 .  aspirin EC 81 MG tablet, Take 81 mg by mouth daily., Disp: , Rfl:  .  atorvastatin (LIPITOR) 10 MG tablet, Take 1 tablet (10 mg total) by mouth daily., Disp: 90 tablet, Rfl: 1 .  bisacodyl (DULCOLAX) 10 MG suppository, Place 1 suppository (10 mg total) rectally as needed for moderate constipation or severe constipation., Disp: 10 suppository, Rfl: 0 .  diclofenac sodium (VOLTAREN) 1 % GEL, Apply 2 g topically 4 (four) times daily., Disp: 150 g, Rfl: 1 .  docusate sodium (COLACE) 100 MG capsule, Take 1 capsule (100 mg total) by mouth 2 (two) times daily., Disp: 60 capsule, Rfl: 2 .  Elastic Bandages & Supports (TRUFORM STOCKINGS 20-30MMHG) MISC, Wear stockings, put on in the  morning and remove at night, Disp: 2 each, Rfl: 1 .  famotidine (PEPCID) 20 MG tablet, Take 1 tablet (20 mg total) by mouth daily., Disp: 90 tablet, Rfl: 0 .  hydrocortisone (ANUSOL-HC) 2.5 % rectal cream, Place 1 application rectally 2 (two) times daily., Disp: 30 g, Rfl: 0 .  hydrocortisone cream 0.5 %, Apply 1 application topically 2 (two) times daily., Disp: 30 g, Rfl: 0 .  lactulose (CHRONULAC) 10 GM/15ML solution, Take 30 mLs (20 g total) by mouth daily as needed for mild constipation., Disp: 120 mL, Rfl: 0 .  levothyroxine (SYNTHROID) 25 MCG tablet, Take 1 tablet (25 mcg total) by mouth daily before breakfast for 7 days., Disp: 7 tablet, Rfl: 0 .  levothyroxine (SYNTHROID) 50 MCG tablet, Take 1 tablet (50 mcg total) by mouth daily., Disp: 90 tablet, Rfl: 3 .  Lidocaine 0.5 % GEL, Apply 1 application topically daily., Disp: 1 Tube, Rfl: 0 .  lisinopril (ZESTRIL) 20 MG tablet, Take 1 tablet (20 mg total) by mouth daily., Disp: 90 tablet, Rfl: 0 .  Olopatadine HCl 0.2 % SOLN, Apply 1 drop to eye every morning., Disp: 1 Bottle, Rfl: 0 .  polyethylene glycol (MIRALAX) 17 g packet, Take 17 g by mouth daily., Disp: 14 each, Rfl: 0 .  polyethylene glycol (MIRALAX) packet, Take 17 g by mouth daily., Disp: 14 each, Rfl:  0 .  traMADol (ULTRAM) 50 MG tablet, Take 1 tablet (50 mg total) by mouth every 6 (six) hours as needed., Disp: 20 tablet, Rfl: 0 .  triamcinolone cream (KENALOG) 0.1 %, Apply 1 application topically 2 (two) times daily. (skin over the ankles and lower legs), Disp: 80 g, Rfl: 1  Patient Active Problem List   Diagnosis Date Noted  . Anemia 06/15/2018  . Atherosclerosis of aorta (HCC) 05/16/2018  . Venous insufficiency 08/08/2017  . Lichen simplex chronicus 08/08/2017  . Swelling of limb 05/30/2017  . Left arm pain 08/30/2016  . Bilateral leg cramps 07/06/2016  . Chronic kidney disease, stage 3 07/07/2015  . Cardiac murmur, previously undiagnosed 04/06/2015  . Dementia (HCC)  04/06/2015  . Difficulty hearing 04/06/2015  . OP (osteoporosis) 04/06/2015  . Hypertension 12/23/2014  . GERD (gastroesophageal reflux disease) 12/23/2014  . Adult hypothyroidism 12/23/2014  . Hyperlipidemia 12/23/2014  . Iron deficiency anemia 12/23/2014    Past Surgical History:  Procedure Laterality Date  . EYE SURGERY Right    Cataract    Family History  Problem Relation Age of Onset  . Stroke Mother   . Healthy Father     Social History   Tobacco Use  . Smoking status: Never Smoker  . Smokeless tobacco: Never Used  . Tobacco comment: smoking cessation materials not required  Vaping Use  . Vaping Use: Never used  Substance Use Topics  . Alcohol use: No    Alcohol/week: 0.0 standard drinks  . Drug use: No     No Known Allergies  Health Maintenance  Topic Date Due  . COVID-19 Vaccine (1) Never done  . TETANUS/TDAP  06/13/2026 (Originally 10/12/1936)  . PNA vac Low Risk Adult (1 of 2 - PCV13) 06/13/2026 (Originally 10/11/1984)  . DEXA SCAN  Completed  . INFLUENZA VACCINE  Discontinued    Chart Review Today: I personally reviewed active problem list, medication list, allergies, family history, social history, health maintenance, notes from last encounter, lab results, imaging with the patient/caregiver today.   Review of Systems  10 Systems reviewed and are negative for acute change except as noted in the HPI.  Objective:   Vitals:   03/09/20 1504  BP: 118/72  Pulse: 81  Resp: 14  Temp: 99.1 F (37.3 C)  SpO2: 96%  Weight: 129 lb 4.8 oz (58.7 kg)  Height: 5\' 3"  (1.6 m)    Body mass index is 22.9 kg/m.  Physical Exam Vitals and nursing note reviewed.  Constitutional:      General: She is not in acute distress.    Appearance: Normal appearance. She is well-developed and well-groomed. She is not ill-appearing, toxic-appearing or diaphoretic.     Interventions: Face mask in place.     Comments: Elderly female HOH, pleasant, alert and  well-appearing  HENT:     Head: Normocephalic and atraumatic.     Right Ear: External ear normal. Decreased hearing noted.     Left Ear: External ear normal. Decreased hearing noted.  Eyes:     General: Lids are normal. No scleral icterus.       Right eye: No discharge.        Left eye: No discharge.     Conjunctiva/sclera: Conjunctivae normal.  Neck:     Trachea: Phonation normal. No tracheal deviation.  Cardiovascular:     Rate and Rhythm: Normal rate and regular rhythm.     Pulses: Normal pulses.          Radial pulses  are 2+ on the right side and 2+ on the left side.       Posterior tibial pulses are 2+ on the right side and 2+ on the left side.     Heart sounds: Normal heart sounds. No murmur heard.  No friction rub. No gallop.   Pulmonary:     Effort: Pulmonary effort is normal. No respiratory distress.     Breath sounds: Normal breath sounds. No stridor. No wheezing, rhonchi or rales.  Chest:     Chest wall: No tenderness.  Abdominal:     General: Bowel sounds are normal. There is no distension.     Palpations: Abdomen is soft.  Musculoskeletal:     Right lower leg: No edema.     Left lower leg: No edema.  Skin:    General: Skin is warm and dry.     Coloration: Skin is not jaundiced or pale.     Findings: No rash.  Neurological:     Mental Status: She is alert.     Cranial Nerves: No facial asymmetry.     Motor: No abnormal muscle tone.     Comments: Uses rolling walker to get up from chair and to ambulate  Psychiatric:        Mood and Affect: Mood normal.        Speech: Speech normal.        Behavior: Behavior normal. Behavior is cooperative.         Assessment & Plan:     ICD-10-CM   1. Other specified hypothyroidism  E03.8 TSH   recheck TSH today - adjust dose as needed for sx and to get TSH back in normal range     Return in about 6 months (around 09/06/2020) for thyroid, HLD, HTN.   [name], PA-C 03/09/20 1:32 PM

## 2020-03-09 NOTE — Patient Instructions (Addendum)
Hypothyroidism  Hypothyroidism is when the thyroid gland does not make enough of certain hormones (it is underactive). The thyroid gland is a small gland located in the lower front part of the neck, just in front of the windpipe (trachea). This gland makes hormones that help control how the body uses food for energy (metabolism) as well as how the heart and brain function. These hormones also play a role in keeping your bones strong. When the thyroid is underactive, it produces too little of the hormones thyroxine (T4) and triiodothyronine (T3). What are the causes? This condition may be caused by:  Hashimoto's disease. This is a disease in which the body's disease-fighting system (immune system) attacks the thyroid gland. This is the most common cause.  Viral infections.  Pregnancy.  Certain medicines.  Birth defects.  Past radiation treatments to the head or neck for cancer.  Past treatment with radioactive iodine.  Past exposure to radiation in the environment.  Past surgical removal of part or all of the thyroid.  Problems with a gland in the center of the brain (pituitary gland).  Lack of enough iodine in the diet. What increases the risk? You are more likely to develop this condition if:  You are female.  You have a family history of thyroid conditions.  You use a medicine called lithium.  You take medicines that affect the immune system (immunosuppressants). What are the signs or symptoms? Symptoms of this condition include:  Feeling as though you have no energy (lethargy).  Not being able to tolerate cold.  Weight gain that is not explained by a change in diet or exercise habits.  Lack of appetite.  Dry skin.  Coarse hair.  Menstrual irregularity.  Slowing of thought processes.  Constipation.  Sadness or depression. How is this diagnosed? This condition may be diagnosed based on:  Your symptoms, your medical history, and a physical exam.  Blood  tests. You may also have imaging tests, such as an ultrasound or MRI. How is this treated? This condition is treated with medicine that replaces the thyroid hormones that your body does not make. After you begin treatment, it may take several weeks for symptoms to go away. Follow these instructions at home:  Take over-the-counter and prescription medicines only as told by your health care provider.  If you start taking any new medicines, tell your health care provider.  Keep all follow-up visits as told by your health care provider. This is important. ? As your condition improves, your dosage of thyroid hormone medicine may change. ? You will need to have blood tests regularly so that your health care provider can monitor your condition. Contact a health care provider if:  Your symptoms do not get better with treatment.  You are taking thyroid replacement medicine and you: ? Sweat a lot. ? Have tremors. ? Feel anxious. ? Lose weight rapidly. ? Cannot tolerate heat. ? Have emotional swings. ? Have diarrhea. ? Feel weak. Get help right away if you have:  Chest pain.  An irregular heartbeat.  A rapid heartbeat.  Difficulty breathing. Summary  Hypothyroidism is when the thyroid gland does not make enough of certain hormones (it is underactive).  When the thyroid is underactive, it produces too little of the hormones thyroxine (T4) and triiodothyronine (T3).  The most common cause is Hashimoto's disease, a disease in which the body's disease-fighting system (immune system) attacks the thyroid gland. The condition can also be caused by viral infections, medicine, pregnancy, or past   radiation treatment to the head or neck.  Symptoms may include weight gain, dry skin, constipation, feeling as though you do not have energy, and not being able to tolerate cold.  This condition is treated with medicine to replace the thyroid hormones that your body does not make. This information  is not intended to replace advice given to you by your health care provider. Make sure you discuss any questions you have with your health care provider. Document Revised: 05/12/2017 Document Reviewed: 05/10/2017 Elsevier Patient Education  2020 [employer].    Dementia Dementia is a condition that affects the way the brain works. It often affects memory and thinking. There are many types of dementia. Some types get worse with time and cannot be reversed. Some types of dementia include:  Alzheimer's disease. This is the most common type.  Vascular dementia. This type may happen due to a stroke.  Lewy body dementia. This type may happen to people who have Parkinson's disease.  Frontotemporal dementia. This type is caused by damage to nerve cells in certain parts of the brain. Some people may have more than one type, and this is called mixed dementia. What are the causes? This condition is caused by damage to cells in the brain. Some causes that cannot be reversed include:  Having a condition that affects the blood vessels of the brain, such as diabetes, heart disease, or blood vessel disease.  Changes to genes. Some causes that can be reversed or slowed include:  Injury to the brain.  Certain medicines.  Infection.  Not having enough vitamin B12 in the body, or thyroid problems.  A tumor or blood clot in the brain. What are the signs or symptoms? Symptoms depend on the type of dementia. This may include:  Problems remembering things.  Having trouble taking a bath or putting clothes on.  Forgetting appointments.  Forgetting to pay bills.  Trouble planning and making meals.  Having trouble speaking.  Getting lost easily. How is this treated? Treatment depends on the cause of the dementia. It might include taking medicines that help:  To control the dementia.  To slow down the dementia.  To manage symptoms. In some cases, treating the cause of your dementia can  improve symptoms, reverse symptoms, or slow down how quickly it gets worse. Your doctor can help you find support groups and other doctors who can help with your care. Follow these instructions at home: Medicines  Take over-the-counter and prescription medicines only as told by your doctor.  Use a pill organizer to help you manage your medicines.  Avoidtaking medicines for pain or for sleep. Lifestyle  Make healthy choices: ? Be active as told by your doctor. ? Do not use any products that contain nicotine or tobacco, such as cigarettes, e-cigarettes, and chewing tobacco. If you need help quitting, ask your doctor. ? Do not drink alcohol. ? When you get stressed, do something that will help you to relax. Your doctor can give you tips. ? Spend time with other people.  Make sure you get good sleep. To get good sleep: ? Try not to take naps during the day. ? Keep your bedroom dark and cool. ? In the few hours before you go to bed, try not to do any exercise. ? Do not have foods and drinks with caffeine at night. Eating and drinking  Drink enough fluid to keep your pee (urine) pale yellow.  Eat a healthy diet. General instructions   Talk with your doctor  to figure out: ? What you need help with. ? What your safety needs are.  Ask your doctor if it is safe for you to drive.  If told, wear a bracelet that tracks where you are or shows that you are a person with memory loss.  Work with your family to make big decisions.  Keep all follow-up visits as told by your doctor. This is important. Contact a doctor if:  You have any new symptoms.  Your symptoms get worse.  You have problems with swallowing or choking. Get help right away if:  You feel very sad, or feel that you want to harm yourself.  You or your family members are worried for your safety. If you ever feel like you may hurt yourself or others, or have thoughts about taking your own life, get help right away. You  can go to your nearest emergency department or call:  Your local emergency services (911 in the [place]  A suicide crisis helpline, such as the National Suicide Prevention Lifeline at (780) 474-8265. This is open 24 hours a day. Summary  Dementia often affects memory and thinking.  Some types of dementia get worse with time and cannot be reversed.  Treatment for this condition depends on the cause.  Talk with your doctor to figure out what you need help with.  Your doctor can help you find support groups and other doctors who can help with your care. This information is not intended to replace advice given to you by your health care provider. Make sure you discuss any questions you have with your health care provider. Document Revised: 08/14/2018 Document Reviewed: 08/14/2018 Elsevier Patient Education  2020 [employer].

## 2020-03-10 ENCOUNTER — Other Ambulatory Visit: Payer: Self-pay | Admitting: Family Medicine

## 2020-04-01 ENCOUNTER — Ambulatory Visit: Payer: Self-pay | Admitting: *Deleted

## 2020-04-01 NOTE — Chronic Care Management (AMB) (Signed)
Patient's CCM status changed to previously enrolled.  Serrina Minogue, LCSW Clinical Social Worker  [facility] Care [facility] [phone]  

## 2020-04-29 ENCOUNTER — Emergency Department
Admission: EM | Admit: 2020-04-29 | Discharge: 2020-04-29 | Disposition: A | Discharge status: Home / Self Care | Payer: Medicare HMO | Attending: Emergency Medicine | Admitting: Emergency Medicine

## 2020-04-29 ENCOUNTER — Emergency Department: Payer: Medicare HMO

## 2020-04-29 ENCOUNTER — Other Ambulatory Visit: Payer: Self-pay

## 2020-04-29 ENCOUNTER — Encounter: Payer: Self-pay | Admitting: Emergency Medicine

## 2020-04-29 DIAGNOSIS — N183 Chronic kidney disease, stage 3 unspecified: Secondary | ICD-10-CM | POA: Insufficient documentation

## 2020-04-29 DIAGNOSIS — S32010A Wedge compression fracture of first lumbar vertebra, initial encounter for closed fracture: Secondary | ICD-10-CM | POA: Diagnosis not present

## 2020-04-29 DIAGNOSIS — S22080A Wedge compression fracture of T11-T12 vertebra, initial encounter for closed fracture: Secondary | ICD-10-CM | POA: Diagnosis not present

## 2020-04-29 DIAGNOSIS — Z7982 Long term (current) use of aspirin: Secondary | ICD-10-CM | POA: Insufficient documentation

## 2020-04-29 DIAGNOSIS — R52 Pain, unspecified: Secondary | ICD-10-CM | POA: Diagnosis not present

## 2020-04-29 DIAGNOSIS — X58XXXA Exposure to other specified factors, initial encounter: Secondary | ICD-10-CM | POA: Diagnosis not present

## 2020-04-29 DIAGNOSIS — F0390 Unspecified dementia without behavioral disturbance: Secondary | ICD-10-CM | POA: Diagnosis not present

## 2020-04-29 DIAGNOSIS — R109 Unspecified abdominal pain: Secondary | ICD-10-CM | POA: Insufficient documentation

## 2020-04-29 DIAGNOSIS — I129 Hypertensive chronic kidney disease with stage 1 through stage 4 chronic kidney disease, or unspecified chronic kidney disease: Secondary | ICD-10-CM | POA: Insufficient documentation

## 2020-04-29 DIAGNOSIS — M545 Low back pain, unspecified: Secondary | ICD-10-CM

## 2020-04-29 DIAGNOSIS — S24109A Unspecified injury at unspecified level of thoracic spinal cord, initial encounter: Secondary | ICD-10-CM | POA: Diagnosis present

## 2020-04-29 DIAGNOSIS — Z79899 Other long term (current) drug therapy: Secondary | ICD-10-CM | POA: Insufficient documentation

## 2020-04-29 DIAGNOSIS — I10 Essential (primary) hypertension: Secondary | ICD-10-CM | POA: Diagnosis not present

## 2020-04-29 DIAGNOSIS — R1084 Generalized abdominal pain: Secondary | ICD-10-CM | POA: Diagnosis not present

## 2020-04-29 DIAGNOSIS — K219 Gastro-esophageal reflux disease without esophagitis: Secondary | ICD-10-CM | POA: Diagnosis not present

## 2020-04-29 DIAGNOSIS — M48061 Spinal stenosis, lumbar region without neurogenic claudication: Secondary | ICD-10-CM | POA: Diagnosis not present

## 2020-04-29 LAB — CBC WITH DIFFERENTIAL/PLATELET
Abs Immature Granulocytes: 0.01 10*3/uL (ref 0.00–0.07)
Basophils Absolute: 0 10*3/uL (ref 0.0–0.1)
Basophils Relative: 0 %
Eosinophils Absolute: 0.1 10*3/uL (ref 0.0–0.5)
Eosinophils Relative: 1 %
HCT: 30.7 % — ABNORMAL LOW (ref 36.0–46.0)
Hemoglobin: 9.6 g/dL — ABNORMAL LOW (ref 12.0–15.0)
Immature Granulocytes: 0 %
Lymphocytes Relative: 23 %
Lymphs Abs: 1.3 10*3/uL (ref 0.7–4.0)
MCH: 25.2 pg — ABNORMAL LOW (ref 26.0–34.0)
MCHC: 31.3 g/dL (ref 30.0–36.0)
MCV: 80.6 fL (ref 80.0–100.0)
Monocytes Absolute: 0.4 10*3/uL (ref 0.1–1.0)
Monocytes Relative: 7 %
Neutro Abs: 4 10*3/uL (ref 1.7–7.7)
Neutrophils Relative %: 69 %
Platelets: 222 10*3/uL (ref 150–400)
RBC: 3.81 MIL/uL — ABNORMAL LOW (ref 3.87–5.11)
RDW: 15.3 % (ref 11.5–15.5)
WBC: 5.7 10*3/uL (ref 4.0–10.5)
nRBC: 0 % (ref 0.0–0.2)

## 2020-04-29 LAB — URINALYSIS, COMPLETE (UACMP) WITH MICROSCOPIC
Bacteria, UA: NONE SEEN
Bilirubin Urine: NEGATIVE
Glucose, UA: NEGATIVE mg/dL
Hgb urine dipstick: NEGATIVE
Ketones, ur: NEGATIVE mg/dL
Leukocytes,Ua: NEGATIVE
Nitrite: NEGATIVE
Protein, ur: NEGATIVE mg/dL
Specific Gravity, Urine: 1.01 (ref 1.005–1.030)
Squamous Epithelial / HPF: NONE SEEN (ref 0–5)
pH: 5 (ref 5.0–8.0)

## 2020-04-29 LAB — HEPATIC FUNCTION PANEL
ALT: 13 U/L (ref 0–44)
AST: 26 U/L (ref 15–41)
Albumin: 3.9 g/dL (ref 3.5–5.0)
Alkaline Phosphatase: 53 U/L (ref 38–126)
Bilirubin, Direct: 0.1 mg/dL (ref 0.0–0.2)
Indirect Bilirubin: 0.2 mg/dL — ABNORMAL LOW (ref 0.3–0.9)
Total Bilirubin: 0.3 mg/dL (ref 0.3–1.2)
Total Protein: 7.2 g/dL (ref 6.5–8.1)

## 2020-04-29 LAB — TROPONIN I (HIGH SENSITIVITY): Troponin I (High Sensitivity): 16 ng/L (ref <18)

## 2020-04-29 LAB — BASIC METABOLIC PANEL
Anion gap: 11 (ref 5–15)
BUN: 19 mg/dL (ref 8–23)
CO2: 27 mmol/L (ref 22–32)
Calcium: 9.4 mg/dL (ref 8.9–10.3)
Chloride: 102 mmol/L (ref 98–111)
Creatinine, Ser: 1.09 mg/dL — ABNORMAL HIGH (ref 0.44–1.00)
GFR, Estimated: 45 mL/min — ABNORMAL LOW (ref >60)
Glucose, Bld: 96 mg/dL (ref 70–99)
Potassium: 3.4 mmol/L — ABNORMAL LOW (ref 3.5–5.1)
Sodium: 140 mmol/L (ref 135–145)

## 2020-04-29 LAB — LIPASE, BLOOD: Lipase: 28 U/L (ref 11–51)

## 2020-04-29 MED ORDER — ACETAMINOPHEN 325 MG PO TABS
650.0000 mg | ORAL_TABLET | Freq: Once | ORAL | Status: AC
  Administered 2020-04-29: 650 mg via ORAL
  Filled 2020-04-29: qty 2

## 2020-04-29 MED ORDER — AMLODIPINE BESYLATE 5 MG PO TABS
5.0000 mg | ORAL_TABLET | Freq: Once | ORAL | Status: AC
  Administered 2020-04-29: 5 mg via ORAL
  Filled 2020-04-29: qty 1

## 2020-04-29 NOTE — Progress Notes (Signed)
Orthopedic Tech Progress Note Patient Details:  Karen Davila Jun 09, 1918 [id number] Called in order to [facility] for a TLSO Patient ID: Karen Davila, female   DOB: 02-Aug-1917, 86 y.o.   MRN: [id number]   Karen Davila 04/29/2020, 1:51 PM

## 2020-04-29 NOTE — ED Provider Notes (Signed)
[facility] Emergency Department Provider Note ____________________________________________   First MD Initiated Contact with Patient 04/29/20 1053     (approximate)  I have reviewed the triage vital signs and the nursing notes.   HISTORY  Chief Complaint L Side Pain  Level 5 caveat: History of present illness limited due to hearing impairment and dementia  HPI Karen Davila is a [age] y.o. female with PMH as noted below who presents with left flank and low back pain, acute onset this morning.  It is worse with movement.  The patient denies any nausea or vomiting.  She denies injury.  Past Medical History:  Diagnosis Date  . Arthritis   . Chronic kidney disease   . GERD (gastroesophageal reflux disease)   . Hyperlipidemia   . Hypertension     Patient Active Problem List   Diagnosis Date Noted  . Anemia 06/15/2018  . Atherosclerosis of aorta (HCC) 05/16/2018  . Venous insufficiency 08/08/2017  . Lichen simplex chronicus 08/08/2017  . Swelling of limb 05/30/2017  . Left arm pain 08/30/2016  . Bilateral leg cramps 07/06/2016  . Chronic kidney disease, stage 3 (HCC) 07/07/2015  . Cardiac murmur, previously undiagnosed 04/06/2015  . Dementia (HCC) 04/06/2015  . Difficulty hearing 04/06/2015  . OP (osteoporosis) 04/06/2015  . Hypertension 12/23/2014  . GERD (gastroesophageal reflux disease) 12/23/2014  . Adult hypothyroidism 12/23/2014  . Hyperlipidemia 12/23/2014  . Iron deficiency anemia 12/23/2014    Past Surgical History:  Procedure Laterality Date  . EYE SURGERY Right    Cataract    Prior to Admission medications   Medication Sig Start Date End Date Taking? Authorizing Provider  amLODipine (NORVASC) 5 MG tablet TAKE 1 & 1/2 (ONE & ONE-HALF) TABLETS BY MOUTH DAILY 02/08/19   Poulose, Percell BeltElizabeth E, NP  aspirin EC 81 MG tablet Take 81 mg by mouth daily.    [provider]  atorvastatin (LIPITOR) 10 MG tablet Take 1 tablet (10 mg  total) by mouth daily. 05/15/19   Danelle Berryapia, Leisa, PA-C  bisacodyl (DULCOLAX) 10 MG suppository Place 1 suppository (10 mg total) rectally as needed for moderate constipation or severe constipation. 05/15/18   Poulose, Percell BeltElizabeth E, NP  diclofenac sodium (VOLTAREN) 1 % GEL Apply 2 g topically 4 (four) times daily. 03/27/19   Doren CustardBoyce, Emily E, FNP  docusate sodium (COLACE) 100 MG capsule Take 1 capsule (100 mg total) by mouth 2 (two) times daily. 05/15/18   Poulose, Percell BeltElizabeth E, NP  Elastic Bandages & Supports (TRUFORM STOCKINGS 20-30MMHG) MISC Wear stockings, put on in the morning and remove at night 08/08/17   Lada, Janit BernMelinda P, MD  famotidine (PEPCID) 20 MG tablet Take 1 tablet (20 mg total) by mouth daily. 11/30/18   Poulose, Percell BeltElizabeth E, NP  hydrocortisone (ANUSOL-HC) 2.5 % rectal cream Place 1 application rectally 2 (two) times daily. 08/28/18   Doren CustardBoyce, Emily E, FNP  hydrocortisone cream 0.5 % Apply 1 application topically 2 (two) times daily. 05/20/19   Darci CurrentBrown, Horicon N, MD  lactulose (CHRONULAC) 10 GM/15ML solution Take 30 mLs (20 g total) by mouth daily as needed for mild constipation. 10/05/19   Irean HongSung, Jade J, MD  levothyroxine (SYNTHROID) 50 MCG tablet Take 1 tablet (50 mcg total) by mouth daily. 02/06/20   Danelle Berryapia, Leisa, PA-C  Lidocaine 0.5 % GEL Apply 1 application topically daily. 11/02/17   Poulose, Percell BeltElizabeth E, NP  lisinopril (ZESTRIL) 20 MG tablet Take 1 tablet (20 mg total) by mouth daily. 05/27/19   Angelica Chessmanapia,  Leisa, PA-C  Olopatadine HCl 0.2 % SOLN Apply 1 drop to eye every morning. 05/24/16   Triplett, Rulon Eisenmenger B, FNP  polyethylene glycol (MIRALAX) 17 g packet Take 17 g by mouth daily. 01/10/20   Phineas Semen, MD  polyethylene glycol [facility]) packet Take 17 g by mouth daily. 05/07/18   Jeanmarie Plant, MD  traMADol (ULTRAM) 50 MG tablet Take 1 tablet (50 mg total) by mouth every 6 (six) hours as needed. 09/16/19 09/15/20  Emily Filbert, MD  triamcinolone cream (KENALOG) 0.1 % Apply 1 application  topically 2 (two) times daily. (skin over the ankles and lower legs) 08/08/17   Lada, Janit Bern, MD  ranitidine (ZANTAC) 150 MG tablet TAKE 1 TABLET BY MOUTH AT BEDTIME FOR STOMACH Patient not taking: Reported on 08/28/2018 02/22/18 11/30/18  Kerman Passey, MD    Allergies Patient has no known allergies.  Family History  Problem Relation Age of Onset  . Stroke Mother   . Healthy Father     Social History Social History   Tobacco Use  . Smoking status: Never Smoker  . Smokeless tobacco: Never Used  . Tobacco comment: smoking cessation materials not required  Vaping Use  . Vaping Use: Never used  Substance Use Topics  . Alcohol use: No    Alcohol/week: 0.0 standard drinks  . Drug use: No    Review of Systems Level 5 caveat: Review of systems limited due to hearing impairment and dementia Constitutional: No fever. Cardiovascular: Denies chest pain. Respiratory: Denies shortness of breath. Gastrointestinal: No vomiting. Genitourinary: Positive for flank pain. Musculoskeletal: Positive for back pain. Skin: Negative for rash.    ____________________________________________   PHYSICAL EXAM:  VITAL SIGNS: ED Triage Vitals  Enc Vitals Group     BP 04/29/20 1046 (!) 217/85     Pulse --      Resp --      Temp 04/29/20 1046 98.7 F (37.1 C)     Temp Source 04/29/20 1046 Oral     SpO2 04/29/20 1046 99 %     Weight 04/29/20 1048 129 lb 6.6 oz (58.7 kg)     Height 04/29/20 1048 5\' 3"  (1.6 m)     Head Circumference --      Peak Flow --      Pain Score 04/29/20 1047 8     Pain Loc --      Pain Edu? --      Excl. in GC? --     Constitutional: Alert, answering questions appropriately. Well appearing for age, in no acute distress. Eyes: Conjunctivae are normal.  Head: Atraumatic. Nose: No congestion/rhinnorhea. Mouth/Throat: Mucous membranes are moist.   Neck: Normal range of motion.  Cardiovascular: Normal rate, regular rhythm. Grossly normal heart sounds.  Good  peripheral circulation. Respiratory: Normal respiratory effort.  No retractions. Lungs CTAB. Gastrointestinal: Soft and nontender. No distention.  Genitourinary: Mild right flank tenderness. Musculoskeletal: No lower extremity edema.  Extremities warm and well perfused.  Minimal lumbar midline spinal tenderness.  Moderate tenderness to the paraspinal muscles and lateral lower back on the left.  No swelling, induration, or abnormal warmth. Neurologic:  Normal speech and language.  Motor and sensory intact to bilateral lower extremities. Skin:  Skin is warm and dry. No rash noted. Psychiatric: Calm and cooperative.  ____________________________________________   LABS (all labs ordered are listed, but only abnormal results are displayed)  Labs Reviewed  BASIC METABOLIC PANEL - Abnormal; Notable for the following components:  Result Value   Potassium 3.4 (*)    Creatinine, Ser 1.09 (*)    GFR, Estimated 45 (*)    All other components within normal limits  HEPATIC FUNCTION PANEL - Abnormal; Notable for the following components:   Indirect Bilirubin 0.2 (*)    All other components within normal limits  CBC WITH DIFFERENTIAL/PLATELET - Abnormal; Notable for the following components:   RBC 3.81 (*)    Hemoglobin 9.6 (*)    HCT 30.7 (*)    MCH 25.2 (*)    All other components within normal limits  URINALYSIS, COMPLETE (UACMP) WITH MICROSCOPIC - Abnormal; Notable for the following components:   Color, Urine YELLOW (*)    APPearance CLEAR (*)    All other components within normal limits  LIPASE, BLOOD  TROPONIN I (HIGH SENSITIVITY)   ____________________________________________  EKG  ED ECG REPORT I, Dionne Bucy, the attending physician, personally viewed and interpreted this ECG.  Date: 04/29/2020 EKG Time: 1114 Rate: 61 Rhythm: normal sinus rhythm QRS Axis: normal Intervals: normal ST/T Wave abnormalities: Nonspecific T wave abnormalities Narrative Interpretation:  Nonspecific abnormalities with no evidence of acute ischemia   ____________________________________________  RADIOLOGY  CT abdomen/pelvis: No acute abdominal or pelvic abnormalities CT lumbar spine: T12 and L1 compression fractures, T12 appearing acute  ____________________________________________   PROCEDURES  Procedure(s) performed: No  Procedures  Critical Care performed: No ____________________________________________   INITIAL IMPRESSION / ASSESSMENT AND PLAN / ED COURSE  Pertinent labs & imaging results that were available during my care of the patient were reviewed by me and considered in my medical decision making (see chart for details).  85 year old female with PMH as noted above presents with acute onset of left flank/low back pain today.  She also reported some left arm pain to the RN initially, however denies any pain there to me.  History is somewhat difficult based on the patient's hearing impairment and dementia, however review of systems as far as I can ascertain is otherwise negative.  On exam, the patient is quite well-appearing for her age.  Her vital signs are normal except for hypertension.  She has relatively significant tenderness to light palpation of the left lower back paraspinal area and the abdominal wall around the left flank.  There is mild midline tenderness.  There is no significant anterior abdominal tenderness.  There are no abnormal cutaneous findings such as induration, warmth, or rash.  The lower extremities are neuro/vascular intact.  Left arm is nontender.  Differential includes muscular pain, compression fracture or other occult trauma, ureteral stone, UTI/pyonephritis, or less likely colitis or diverticulitis.  The patient has no history of AAA and given the positional and reproducible nature of the pain, I have a very low suspicion for vascular etiology.  The hypertension is likely due to the pain, or the patient may not have taken her  medications this morning.  She has multiple antihypertensives listed on her med list.  She has no chest pain or other symptoms concerning for hypertensive crisis.  We will obtain a CT abdomen/pelvis without contrast, lab work-up including urinalysis, and reassess.  ----------------------------------------- 2:28 PM on 04/29/2020 -----------------------------------------  The lab work-up is unremarkable.  Urinalysis is negative.  CT shows no significant abdominal or pelvic findings, however the patient has T12 and L1 compression fractures.  The T12 fracture appears acute.  The patient has no neurologic deficits in the lower extremities and is moving them freely.  I consulted Dr. Myer Haff from neurosurgery who recommends a TLSO  brace and outpatient follow-up.  We have ordered the brace.  I discussed the results of the work-up with the son and he agrees with the plan of care.  Dr. Lucienne Capers office will arrange for outpatient follow-up.  The patient remains significantly hypertensive.  It is unclear if she got her daily antihypertensives this morning.  We gave a dose of amlodipine.  At this time, given the lack of chest pain or shortness of breath, baseline mental status, and reassuring lab work-up there is no evidence of hypertensive crisis.  Plan will be discharged home once the brace has been applied.  Return precautions were given to the patient and godson, and he expressed understanding. ____________________________________________   FINAL CLINICAL IMPRESSION(S) / ED DIAGNOSES  Final diagnoses:  Lumbar back pain  Compression fracture of T12 vertebra, initial encounter (HCC)      NEW MEDICATIONS STARTED DURING THIS VISIT:  New Prescriptions   No medications on file     Note:  This document was prepared using Dragon voice recognition software and may include unintentional dictation errors.    Dionne Bucy, MD 04/29/20 1430

## 2020-04-29 NOTE — ED Notes (Signed)
Pt family at bedside

## 2020-04-29 NOTE — ED Notes (Signed)
Ortho tech present; applied TLSO brace to patient; provided pt and family member teaching regarding use.

## 2020-04-29 NOTE — ED Notes (Signed)
This RN called to have TLSO brace applied. Staff stating someone would arrive approximately 30-37mins to fit and apply brace.

## 2020-04-29 NOTE — Discharge Instructions (Signed)
The CT scan shows a new fracture of the T12 vertebra.  You should maintain the back brace except when showering until you follow-up with the neurosurgeon Dr. Myer Haff.  Return to the ER for new, worsening, or persistent severe pain, weakness or numbness in the legs, difficulty urinating or incontinence, or any other new or worsening symptoms that concern you.

## 2020-04-29 NOTE — ED Triage Notes (Signed)
Pt arrived via [facility] from [facility]. Pt c/o L side pain starting 0900 this morning. Upon arrival pt also c/o L arm pain.   Pt denies any recent falls. Pt also denies abdominal pain at this time.   Per EMS, pt was hypertensive 185/55 but all other VSS.   Pt is A&Ox4 and HOH

## 2020-05-22 DIAGNOSIS — S32010A Wedge compression fracture of first lumbar vertebra, initial encounter for closed fracture: Secondary | ICD-10-CM | POA: Diagnosis not present

## 2020-05-22 DIAGNOSIS — S32040A Wedge compression fracture of fourth lumbar vertebra, initial encounter for closed fracture: Secondary | ICD-10-CM | POA: Diagnosis not present

## 2020-05-22 DIAGNOSIS — Z8781 Personal history of (healed) traumatic fracture: Secondary | ICD-10-CM | POA: Diagnosis not present

## 2020-05-22 DIAGNOSIS — S22080A Wedge compression fracture of T11-T12 vertebra, initial encounter for closed fracture: Secondary | ICD-10-CM | POA: Diagnosis not present

## 2020-05-26 ENCOUNTER — Ambulatory Visit (INDEPENDENT_AMBULATORY_CARE_PROVIDER_SITE_OTHER): Payer: Medicare HMO | Admitting: Family Medicine

## 2020-05-26 ENCOUNTER — Encounter: Payer: Self-pay | Admitting: Family Medicine

## 2020-05-26 ENCOUNTER — Other Ambulatory Visit: Payer: Self-pay

## 2020-05-26 VITALS — BP 116/72 | HR 83 | Temp 97.9°F | Resp 14 | Ht 63.0 in | Wt 134.9 lb

## 2020-05-26 DIAGNOSIS — I10 Essential (primary) hypertension: Secondary | ICD-10-CM | POA: Diagnosis not present

## 2020-05-26 DIAGNOSIS — D509 Iron deficiency anemia, unspecified: Secondary | ICD-10-CM | POA: Diagnosis not present

## 2020-05-26 DIAGNOSIS — K219 Gastro-esophageal reflux disease without esophagitis: Secondary | ICD-10-CM

## 2020-05-26 DIAGNOSIS — Z23 Encounter for immunization: Secondary | ICD-10-CM

## 2020-05-26 DIAGNOSIS — I700 Atherosclerosis of aorta: Secondary | ICD-10-CM | POA: Diagnosis not present

## 2020-05-26 DIAGNOSIS — E7800 Pure hypercholesterolemia, unspecified: Secondary | ICD-10-CM

## 2020-05-26 DIAGNOSIS — N183 Chronic kidney disease, stage 3 unspecified: Secondary | ICD-10-CM | POA: Diagnosis not present

## 2020-05-26 DIAGNOSIS — M8000XD Age-related osteoporosis with current pathological fracture, unspecified site, subsequent encounter for fracture with routine healing: Secondary | ICD-10-CM

## 2020-05-26 DIAGNOSIS — E039 Hypothyroidism, unspecified: Secondary | ICD-10-CM

## 2020-05-26 DIAGNOSIS — F0390 Unspecified dementia without behavioral disturbance: Secondary | ICD-10-CM

## 2020-05-26 DIAGNOSIS — H9193 Unspecified hearing loss, bilateral: Secondary | ICD-10-CM

## 2020-05-26 DIAGNOSIS — K5900 Constipation, unspecified: Secondary | ICD-10-CM

## 2020-05-26 DIAGNOSIS — Z09 Encounter for follow-up examination after completed treatment for conditions other than malignant neoplasm: Secondary | ICD-10-CM

## 2020-05-26 DIAGNOSIS — M549 Dorsalgia, unspecified: Secondary | ICD-10-CM

## 2020-05-26 DIAGNOSIS — H6123 Impacted cerumen, bilateral: Secondary | ICD-10-CM

## 2020-05-26 MED ORDER — ATORVASTATIN CALCIUM 10 MG PO TABS: 10.0000 mg | ORAL_TABLET | Freq: Every day | ORAL | 1 refills | Status: DC

## 2020-05-26 MED ORDER — POLYETHYLENE GLYCOL 3350 17 G PO PACK
PACK | ORAL | 2 refills | Status: DC
Start: 2020-05-26 — End: 2020-09-04

## 2020-05-26 MED ORDER — LISINOPRIL 20 MG PO TABS: 20.0000 mg | ORAL_TABLET | Freq: Every day | ORAL | 0 refills | Status: DC

## 2020-05-26 MED ORDER — AMLODIPINE BESYLATE 5 MG PO TABS: ORAL_TABLET | ORAL | 1 refills | Status: DC

## 2020-05-26 NOTE — Progress Notes (Signed)
Patient ID: Karen Davila, female    DOB: 02/16/1918, 86 y.o.   MRN: [id number]  PCP: Karen Davila, Karen Sagan, PA-C  Chief Complaint  Patient presents with  . Follow-up    Subjective:   Karen Davila is a 86 y.o. female, presents to clinic with CC of the following:  HPI   Hypothyroidism: Current Medication Regimen: 50 mcgmcg synthroid, back on meds, sx better, less constipation, last labs TSH was back to normal - unclear med compliance - recheck labs today Most recent results are below; we will be repeating labs today. Lab Results  Component Value Date   TSH 1.80 03/09/2020    Hypertension:  Currently managed on lisinopril and amlodipine - may be out of meds - [facility] cannot answer questions, meds are not with her, her Harlow MaresGodson is here with her but she lives alone and manages meds alone so unclear what she is taking Blood pressure today is well controlled.  Was elevated recently with a fall and ER visit pt was in pain at that time, she denies CP today BP Readings from Last 3 Encounters:  05/26/20 116/72  04/29/20 (!) 197/89  03/09/20 118/72   Hyperlipidemia: Currently treated with Lipitor 10 mg -unclear compliance Last Lipids: Lab Results  Component Value Date   CHOL 173 06/15/2018   HDL 54 06/15/2018   LDLCALC 95 06/15/2018   TRIG 140 06/15/2018   CHOLHDL 3.2 06/15/2018   - Denies: Chest pain, shortness of breath  ER visit with fx to thoracic back, she refuses to wear brace or do f/up, pt denies pain today  Ears blocked bilaterally, godson asks for them to be irrigated, has needed before and she has had to go to specialists in the past to have cerumen impaction cleaned out -no pain or swelling to outer ear no discharge  Constipation -patient over the past years had multiple ER visits for constipation, no ER visits for that since getting her back on her thyroid medication, she also has acid reflux medicines on the chart but is not clear what she is taking and she  cannot give me history or answer questions today due to severe lack of hearing On chart is multiple MiraLAX, lactulose, Anusol and cortisone cream, docusate, Colace also Pepcid -unclear if she is taking any of this She did stated she has had a bowel movement recently and is unable to tell me anything else she has no abdominal tenderness on exam and weight and appetite have seemed unchanged with reviewing vital signs, weights and with the observation of her godson who does not live with her  Patient has talked to the chronic care management social worker, she continues to live alone and handle her own medications, household and has refused help recently  Recent ER visit with neurosurgery follow-up-worsening T12 - L1 vertebral compression fracture and new L4   She had follow-up with neurosurgical specialist reviewed encounter through care everywhere: Assessment and Plan: Karen Davila is a [age] y.o. female with known T12 and L1 compression fractures. Also a known chronic compression fracture of L4. Unfortunately, she refuses to wear her brace as she says it is uncomfortable. She also says that she is not having any pain. I did discuss with her godson the need for continued evaluation and the need to wear her brace, however patient refuses. We will see her in 1 month for repeat x-rays. Regarding her blood pressure, I did advise them to present to the walk-in clinic/ED in  order to have this addressed at it is extremely high, however they declined and he made a follow-up appointment with her PCP while he was in the room with me.  [name] is with her today does confirm that she still refuses to wear the brace she would not even let him bring it inside the house, she continues to deny pain but her mobility and ease of transition from sitting to standing is different than past office visits and she does groan slightly though she denies any pain or problems  Anemia -chronic hemoglobin is ranged from 9.4-10.1 over the  past several years  CKD stage III, GFR has been 44 to > 60 over the past 1 to 2 years Blood pressure was significantly elevated at the ER encounter and when they followed up with neurosurgery but improved today, no change in urination, no swelling  Patient Active Problem List   Diagnosis Date Noted  . Anemia 06/15/2018  . Atherosclerosis of aorta (HCC) 05/16/2018  . Venous insufficiency 08/08/2017  . Lichen simplex chronicus 08/08/2017  . Swelling of limb 05/30/2017  . Left arm pain 08/30/2016  . Bilateral leg cramps 07/06/2016  . Chronic kidney disease, stage 3 (HCC) 07/07/2015  . Cardiac murmur, previously undiagnosed 04/06/2015  . Dementia (HCC) 04/06/2015  . Difficulty hearing 04/06/2015  . OP (osteoporosis) 04/06/2015  . Hypertension 12/23/2014  . GERD (gastroesophageal reflux disease) 12/23/2014  . Adult hypothyroidism 12/23/2014  . Hyperlipidemia 12/23/2014  . Iron deficiency anemia 12/23/2014      Current Outpatient Medications:  .  amLODipine (NORVASC) 5 MG tablet, TAKE 1 & 1/2 (ONE & ONE-HALF) TABLETS BY MOUTH DAILY, Disp: 135 tablet, Rfl: 0 .  aspirin EC 81 MG tablet, Take 81 mg by mouth daily., Disp: , Rfl:  .  atorvastatin (LIPITOR) 10 MG tablet, Take 1 tablet (10 mg total) by mouth daily., Disp: 90 tablet, Rfl: 1 .  bisacodyl (DULCOLAX) 10 MG suppository, Place 1 suppository (10 mg total) rectally as needed for moderate constipation or severe constipation., Disp: 10 suppository, Rfl: 0 .  diclofenac sodium (VOLTAREN) 1 % GEL, Apply 2 g topically 4 (four) times daily., Disp: 150 g, Rfl: 1 .  docusate sodium (COLACE) 100 MG capsule, Take 1 capsule (100 mg total) by mouth 2 (two) times daily., Disp: 60 capsule, Rfl: 2 .  Elastic Bandages & Supports (TRUFORM STOCKINGS 20-30MMHG) MISC, Wear stockings, put on in the morning and remove at night, Disp: 2 each, Rfl: 1 .  famotidine (PEPCID) 20 MG tablet, Take 1 tablet (20 mg total) by mouth daily., Disp: 90 tablet, Rfl: 0 .   hydrocortisone (ANUSOL-HC) 2.5 % rectal cream, Place 1 application rectally 2 (two) times daily., Disp: 30 g, Rfl: 0 .  hydrocortisone cream 0.5 %, Apply 1 application topically 2 (two) times daily., Disp: 30 g, Rfl: 0 .  lactulose (CHRONULAC) 10 GM/15ML solution, Take 30 mLs (20 g total) by mouth daily as needed for mild constipation., Disp: 120 mL, Rfl: 0 .  levothyroxine (SYNTHROID) 50 MCG tablet, Take 1 tablet (50 mcg total) by mouth daily., Disp: 90 tablet, Rfl: 3 .  Lidocaine 0.5 % GEL, Apply 1 application topically daily., Disp: 1 Tube, Rfl: 0 .  lisinopril (ZESTRIL) 20 MG tablet, Take 1 tablet (20 mg total) by mouth daily., Disp: 90 tablet, Rfl: 0 .  Olopatadine HCl 0.2 % SOLN, Apply 1 drop to eye every morning., Disp: 1 Bottle, Rfl: 0 .  polyethylene glycol (MIRALAX) 17 g packet, Take 17 g  by mouth daily., Disp: 14 each, Rfl: 0 .  polyethylene glycol (MIRALAX) packet, Take 17 g by mouth daily., Disp: 14 each, Rfl: 0 .  traMADol (ULTRAM) 50 MG tablet, Take 1 tablet (50 mg total) by mouth every 6 (six) hours as needed., Disp: 20 tablet, Rfl: 0 .  triamcinolone cream (KENALOG) 0.1 %, Apply 1 application topically 2 (two) times daily. (skin over the ankles and lower legs), Disp: 80 g, Rfl: 1   No Known Allergies   Social History   Tobacco Use  . Smoking status: Never Smoker  . Smokeless tobacco: Never Used  . Tobacco comment: smoking cessation materials not required  Vaping Use  . Vaping Use: Never used  Substance Use Topics  . Alcohol use: No    Alcohol/week: 0.0 standard drinks  . Drug use: No      Chart Review Today: I personally reviewed active problem list, medication list, allergies, family history, social history, health maintenance, notes from last encounter, lab results, imaging with the patient/caregiver today.   Review of Systems 10 Systems reviewed and are negative for acute change except as noted in the HPI. Hx and ROS limited due to pt HOH    Objective:    Vitals:   05/26/20 1048  BP: 116/72  Pulse: 83  Resp: 14  Temp: 97.9 F (36.6 C)  SpO2: 94%  Weight: 134 lb 14.4 oz (61.2 kg)  Height: 5\' 3"  (1.6 m)    Body mass index is 23.9 kg/m.  Physical Exam Vitals and nursing note reviewed.  Constitutional:      General: She is not in acute distress.    Appearance: She is not toxic-appearing or diaphoretic.     Comments: Elderly, appears stated age, nontoxic, appearance is slightly unkept with unclean clothes somewhat disheveled, hunched over seated in exam chair  HENT:     Head: Normocephalic and atraumatic.     Right Ear: External ear normal. Decreased hearing noted. There is impacted cerumen.     Left Ear: External ear normal. Decreased hearing noted. There is impacted cerumen.  Eyes:     General: No scleral icterus.       Right eye: No discharge.        Left eye: No discharge.     Conjunctiva/sclera: Conjunctivae normal.  Cardiovascular:     Rate and Rhythm: Normal rate and regular rhythm.     Pulses: Normal pulses.     Heart sounds: Normal heart sounds.  Pulmonary:     Effort: Pulmonary effort is normal. No respiratory distress.     Breath sounds: Normal breath sounds. No stridor. No wheezing, rhonchi or rales.  Abdominal:     General: Bowel sounds are normal. There is no distension.     Palpations: Abdomen is soft.     Tenderness: There is no abdominal tenderness.  Musculoskeletal:     Right lower leg: No edema.     Left lower leg: No edema.     Comments: Patient hunched over in exam room chair, difficulty with positional changes going from sitting to standing and with walking with her walker, did palpate her spine no midline spinal tenderness or paraspinal tenderness on exam, no step-off  Skin:    General: Skin is warm and dry.     Coloration: Skin is not pale.     Findings: No bruising, erythema, lesion or rash.  Neurological:     Mental Status: She is alert.     Gait: Gait abnormal.  Comments: Alert responds  to voice and follows demonstrated commands but so hard of hearing difficult to assess mental status, she appears at her baseline  responsiveness  Psychiatric:        Mood and Affect: Mood normal.      Results for orders placed or performed during the hospital encounter of 04/29/20  Basic metabolic panel  Result Value Ref Range   Sodium 140 135 - 145 mmol/L   Potassium 3.4 (L) 3.5 - 5.1 mmol/L   Chloride 102 98 - 111 mmol/L   CO2 27 22 - 32 mmol/L   Glucose, Bld 96 70 - 99 mg/dL   BUN 19 8 - 23 mg/dL   Creatinine, Ser 5.18 (H) 0.44 - 1.00 mg/dL   Calcium 9.4 8.9 - 84.1 mg/dL   GFR, Estimated 45 (L) >60 mL/min   Anion gap 11 5 - 15  Hepatic function panel  Result Value Ref Range   Total Protein 7.2 6.5 - 8.1 g/dL   Albumin 3.9 3.5 - 5.0 g/dL   AST 26 15 - 41 U/L   ALT 13 0 - 44 U/L   Alkaline Phosphatase 53 38 - 126 U/L   Total Bilirubin 0.3 0.3 - 1.2 mg/dL   Bilirubin, Direct 0.1 0.0 - 0.2 mg/dL   Indirect Bilirubin 0.2 (L) 0.3 - 0.9 mg/dL  Lipase, blood  Result Value Ref Range   Lipase 28 11 - 51 U/L  CBC with Differential  Result Value Ref Range   WBC 5.7 4.0 - 10.5 K/uL   RBC 3.81 (L) 3.87 - 5.11 MIL/uL   Hemoglobin 9.6 (L) 12.0 - 15.0 g/dL   HCT 66.0 (L) 63.0 - 16.0 %   MCV 80.6 80.0 - 100.0 fL   MCH 25.2 (L) 26.0 - 34.0 pg   MCHC 31.3 30.0 - 36.0 g/dL   RDW 10.9 32.3 - 55.7 %   Platelets 222 150 - 400 K/uL   nRBC 0.0 0.0 - 0.2 %   Neutrophils Relative % 69 %   Neutro Abs 4.0 1.7 - 7.7 K/uL   Lymphocytes Relative 23 %   Lymphs Abs 1.3 0.7 - 4.0 K/uL   Monocytes Relative 7 %   Monocytes Absolute 0.4 0.1 - 1.0 K/uL   Eosinophils Relative 1 %   Eosinophils Absolute 0.1 0.0 - 0.5 K/uL   Basophils Relative 0 %   Basophils Absolute 0.0 0.0 - 0.1 K/uL   Immature Granulocytes 0 %   Abs Immature Granulocytes 0.01 0.00 - 0.07 K/uL  Urinalysis, Complete w Microscopic  Result Value Ref Range   Color, Urine YELLOW (A) YELLOW   APPearance CLEAR (A) CLEAR   Specific  Gravity, Urine 1.010 1.005 - 1.030   pH 5.0 5.0 - 8.0   Glucose, UA NEGATIVE NEGATIVE mg/dL   Hgb urine dipstick NEGATIVE NEGATIVE   Bilirubin Urine NEGATIVE NEGATIVE   Ketones, ur NEGATIVE NEGATIVE mg/dL   Protein, ur NEGATIVE NEGATIVE mg/dL   Nitrite NEGATIVE NEGATIVE   Leukocytes,Ua NEGATIVE NEGATIVE   RBC / HPF 0-5 0 - 5 RBC/hpf   WBC, UA 0-5 0 - 5 WBC/hpf   Bacteria, UA NONE SEEN NONE SEEN   Squamous Epithelial / LPF NONE SEEN 0 - 5   Mucus PRESENT    Hyaline Casts, UA PRESENT   Troponin I (High Sensitivity)  Result Value Ref Range   Troponin I (High Sensitivity) 16 <18 ng/L   Indication: Cerumen impaction of the ear(s)  Medical necessity statement: On physical examination, cerumen  impairs clinically significant portions of the external auditory canal, and tympanic membrane. Noted obstructive, copious cerumen that cannot be removed without magnification and instrumentations requiring MD/APP skills/procedure  Consent: Discussed benefits and risks of procedure and verbal consent obtained  Procedure:  Cerumen Disimpaction  Patient was prepped for the procedure.  Gentle ear lavage with warm water and hydrogen peroxide performed on the bilateral ear.    Post procedure examination: shows cerumen was unable to be removed. Patient did not tolerated procedure well -could not hold still and she raised both of her arms up with her hands out gesturing " stop"  No pain with procedure, no erythema or bleeding no vertigo or change to her balance or gait, given information to patient and her godsend to try Debrox softening drops and can return for subsequent repeated irrigation  The patient is made aware that they may experience temporary vertigo, temporary hearing loss, and temporary discomfort.  If these symptom last for more than 24 hours to follow up in clinic.      Assessment & Plan:     ICD-10-CM   1. Essential hypertension  I10 COMPLETE METABOLIC PANEL WITH GFR    lisinopril  (ZESTRIL) 20 MG tablet    amLODipine (NORVASC) 5 MG tablet   stable, well controlled, BP at goal today - meds refilled but unclear what she has or is taking at home, close f/up with meds in clinic   2. Pure hypercholesterolemia  E78.00 Lipid panel    COMPLETE METABOLIC PANEL WITH GFR    atorvastatin (LIPITOR) 10 MG tablet   o9n statin, due for labs  3. Osteoporosis with current pathological fracture with routine healing, unspecified osteoporosis type, subsequent encounter  M80.00XD COMPLETE METABOLIC PANEL WITH GFR    VITAMIN D 25 Hydroxy (Vit-D Deficiency, Fractures)  4. Atherosclerosis of aorta (HCC)  I70.0 Lipid panel    COMPLETE METABOLIC PANEL WITH GFR   on statin  5. Gastroesophageal reflux disease, unspecified whether esophagitis present  K21.9    pepcid 20 mg BID prn  6. Dementia without behavioral disturbance, unspecified dementia type (HCC)  F03.90    referred to CCM SW, patient continues to live alone, refuses recommendations, unable to give history today hard of hearing, unclear what meds she is on  7. Stage 3 chronic kidney disease, unspecified whether stage 3a or 3b CKD (HCC)  N18.30 CBC with Differential/Platelet   monitoring  8. Iron deficiency anemia, unspecified iron deficiency anemia type  D50.9 CBC with Differential/Platelet   monitor   9. Adult hypothyroidism  E03.9 CBC with Differential/Platelet    TSH   recheck labs, finally back to normal range and meds refilled  10. Bilateral impacted cerumen  H61.23    irrigated, wax dry, debrox tx at home and f/up for further irrigation if needed  11. Bilateral hearing loss, unspecified hearing loss type  H91.93    Cerumen impaction and severe hearing loss patient cannot answer questions with multiple attempts to speak louder and louder -may need ENT follow-up  12. Constipation, unspecified constipation type  K59.00    miralax 1/2 cap to full cap daily prn, patient reports having normal bowel movements, no ER visits for  abdominal pain or constipation, removed various meds from  13. Encounter for examination following treatment at hospital  Z09    Multiple compression fractures T12-L1 L4, ER encounter and follow-up with neurosurgery reviewed patient still refuses bracing or any treatment  14. Back pain, unspecified back location, unspecified back pain laterality, unspecified chronicity  M54.9    Patient went to ER for back pain multiple compression fractures, she denies pain but appears uncomfortable and mobility/gait changed  15. Need for immunization against influenza  Z23 Flu Vaccine QUAD High Dose(Fluad)    F/up as needed for ears - can refer to ENT if needed- pt's HOH did seem worse today  Routine f/up in 3-4 months, asked to bring medications with them Med list reviewed, cleaned up, and all medications dosing reviewed with the patient and her godson and each medicine was annotated when it was for  Currently we know that her thyroid medication has been refilled and her last labs showed improvement but unclear what else she is taking if anything  I did discuss with her godson that if she has not been on any medications for blood pressure and then she starts taking blood pressure medication she may have hypotension and we do not want to cause any side effects or harm so he was asked to review meds and get back to [place] of what medication she has at home and has been taking  Am concerned with her appearance of pain with changing positions and slight change to her gait and mobility but she has been seeing a neurosurgical specialist has refused all treatment.  Do think that she is at higher risk for fall or injury due to this, she does have follow-up with a specialist very soon.  She does have a rolling walker and is able to navigate with it fairly well when she gets going but she does seem to have some discomfort and is slower getting up and on her feet  Karen Berry, PA-C 05/26/20 11:03 AM

## 2020-05-27 LAB — COMPLETE METABOLIC PANEL WITH GFR
AG Ratio: 1.3 (calc) (ref 1.0–2.5)
ALT: 9 U/L (ref 6–29)
AST: 18 U/L (ref 10–35)
Albumin: 3.7 g/dL (ref 3.6–5.1)
Alkaline phosphatase (APISO): 59 U/L (ref 37–153)
BUN/Creatinine Ratio: 14 (calc) (ref 6–22)
BUN: 13 mg/dL (ref 7–25)
CO2: 31 mmol/L (ref 20–32)
Calcium: 9 mg/dL (ref 8.6–10.4)
Chloride: 107 mmol/L (ref 98–110)
Creat: 0.96 mg/dL — ABNORMAL HIGH (ref 0.60–0.88)
GFR, Est African American: 56 mL/min/{1.73_m2} — ABNORMAL LOW (ref >=60)
GFR, Est Non African American: 49 mL/min/{1.73_m2} — ABNORMAL LOW (ref >=60)
Globulin: 2.9 g/dL (calc) (ref 1.9–3.7)
Glucose, Bld: 84 mg/dL (ref 65–99)
Potassium: 4.2 mmol/L (ref 3.5–5.3)
Sodium: 146 mmol/L (ref 135–146)
Total Bilirubin: 0.2 mg/dL (ref 0.2–1.2)
Total Protein: 6.6 g/dL (ref 6.1–8.1)

## 2020-05-27 LAB — VITAMIN D 25 HYDROXY (VIT D DEFICIENCY, FRACTURES): Vit D, 25-Hydroxy: 41 ng/mL (ref 30–100)

## 2020-05-27 LAB — LIPID PANEL
Cholesterol: 154 mg/dL (ref <200)
HDL: 60 mg/dL (ref >=50)
LDL Cholesterol (Calc): 77 mg/dL (calc)
Non-HDL Cholesterol (Calc): 94 mg/dL (calc) (ref <130)
Total CHOL/HDL Ratio: 2.6 (calc) (ref <5.0)
Triglycerides: 85 mg/dL (ref <150)

## 2020-05-27 LAB — CBC WITH DIFFERENTIAL/PLATELET
Absolute Monocytes: 419 cells/uL (ref 200–950)
Basophils Absolute: 30 cells/uL (ref 0–200)
Basophils Relative: 0.5 %
Eosinophils Absolute: 118 cells/uL (ref 15–500)
Eosinophils Relative: 2 %
HCT: 30 % — ABNORMAL LOW (ref 35.0–45.0)
Hemoglobin: 9.3 g/dL — ABNORMAL LOW (ref 11.7–15.5)
Lymphs Abs: 1617 cells/uL (ref 850–3900)
MCH: 25.5 pg — ABNORMAL LOW (ref 27.0–33.0)
MCHC: 31 g/dL — ABNORMAL LOW (ref 32.0–36.0)
MCV: 82.2 fL (ref 80.0–100.0)
MPV: 10.8 fL (ref 7.5–12.5)
Monocytes Relative: 7.1 %
Neutro Abs: 3717 cells/uL (ref 1500–7800)
Neutrophils Relative %: 63 %
Platelets: 261 10*3/uL (ref 140–400)
RBC: 3.65 10*6/uL — ABNORMAL LOW (ref 3.80–5.10)
RDW: 14.2 % (ref 11.0–15.0)
Total Lymphocyte: 27.4 %
WBC: 5.9 10*3/uL (ref 3.8–10.8)

## 2020-05-27 LAB — TSH: TSH: 8.92 mIU/L — ABNORMAL HIGH (ref 0.40–4.50)

## 2020-05-29 ENCOUNTER — Other Ambulatory Visit: Payer: Self-pay | Admitting: Family Medicine

## 2020-05-29 DIAGNOSIS — E7800 Pure hypercholesterolemia, unspecified: Secondary | ICD-10-CM

## 2020-05-29 DIAGNOSIS — H9193 Unspecified hearing loss, bilateral: Secondary | ICD-10-CM

## 2020-05-29 DIAGNOSIS — I10 Essential (primary) hypertension: Secondary | ICD-10-CM

## 2020-05-29 DIAGNOSIS — F0390 Unspecified dementia without behavioral disturbance: Secondary | ICD-10-CM

## 2020-05-29 DIAGNOSIS — E039 Hypothyroidism, unspecified: Secondary | ICD-10-CM

## 2020-06-01 ENCOUNTER — Telehealth: Payer: Self-pay | Admitting: *Deleted

## 2020-06-01 NOTE — Chronic Care Management (AMB) (Signed)
  Chronic Care Management   Note  06/01/2020 Name: Karen Davila MRN: [id number] DOB: [birth year]  Karen Davila is a 86 y.o. year old female who is a primary care patient of Delsa Grana, [place]. I reached out to [employer] by phone today in response to a referral sent by Karen Davila's PCP, Verline Lema PA-C.     Karen Davila was given information about Chronic Care Management services today including:  1. CCM service includes personalized support from designated clinical staff supervised by her physician, including individualized plan of care and coordination with other care providers 2. 24/7 contact phone numbers for assistance for urgent and routine care needs. 3. Service will only be billed when office clinical staff spend 20 minutes or more in a month to coordinate care. 4. Only one practitioner may furnish and bill the service in a calendar month. 5. The patient may stop CCM services at any time (effective at the end of the month) by phone call to the office staff. 6. The patient will be responsible for cost sharing (co-pay) of up to 20% of the service fee (after annual deductible is met).  Patient agreed to services and verbal consent obtained.   Follow up plan: Face to Face appointment with care management team member scheduled for:  06/10/2020  [place] [facility]  Direct Dial: [phone]

## 2020-06-08 ENCOUNTER — Telehealth: Payer: Self-pay

## 2020-06-08 NOTE — Chronic Care Management (AMB) (Addendum)
Chronic Care Management Pharmacy  Name: Karen Davila  MRN: [id number] DOB: [birth year]   Chief Complaint/ HPI  Karen Davila 66,  86 y.o. , female presents for her Initial CCM visit with the clinical pharmacist In office.  PCP : Karen Grana, PA-C Patient Care Team: Karen Grana, PA-C as PCP - General (Family Medicine) Karen Davila, [facility] (Pharmacist)  Patient's chronic conditions include: Hypertension, Hyperlipidemia, Coronary Artery Disease, GERD, Chronic Kidney Disease, Hypothyroidism, Osteoporosis and Dementia    Office Visits: 05/26/20: Patient presented to Karen Grana, PA-C for follow-up. Questions of compliance and adherence to medications. TSH elevated. Miralax dose decreased. Bisacodyl, diclofenac, docusate, famotidine, hydrocortisone, lactulose, lidonain, olopatadine, tramadol, and triamcinolone stopped. 03/09/20: Patient presented to Karen Grana, PA-C for follow-up. TSH improved to 1.8. Patient to continue on Levothyroxine 50 mcg daily.    Consult Visit: 04/29/20: Patient presented to ED for compression fracture of T12 vertebra   Subjective: The patient is in good spirits today. She is accompanied by Karen Davila, her son who helps her with doctor's appointments and picking up prescriptions. She is very hard of hearing so her son provides the majority of the information for the visit. They brought in her medication bottles, but they did not have any aspirin or levothyroxine, and stated they were not taking any. Her son has been frustrated about having to go to the pharmacy regularly and struggles to help his mother with taking her medications as directed.   Objective: No Known Allergies  Medications: Outpatient Encounter Medications as of 06/10/2020  Medication Sig   amLODipine (NORVASC) 5 MG tablet TAKE 1 & 1/2 (ONE & ONE-HALF) TABLETS BY MOUTH DAILY (Patient taking differently: Take 5 mg by mouth daily. TAKE 1 & 1/2 (ONE & ONE-HALF) TABLETS BY MOUTH DAILY)    atorvastatin (LIPITOR) 10 MG tablet Take 1 tablet (10 mg total) by mouth daily.   lisinopril (ZESTRIL) 20 MG tablet Take 1 tablet (20 mg total) by mouth daily.   aspirin EC 81 MG tablet Take 81 mg by mouth daily. (Patient not taking: Reported on 06/10/2020)   levothyroxine (SYNTHROID) 50 MCG tablet Take 1 tablet (50 mcg total) by mouth daily. (Patient not taking: Reported on 06/10/2020)   polyethylene glycol (MIRALAX) 17 g packet Take 1/2 a cap to 1 cap dissolved in clear liquid, po, once daily as needed for constipation or hard stool   [DISCONTINUED] ranitidine (ZANTAC) 150 MG tablet TAKE 1 TABLET BY MOUTH AT BEDTIME FOR STOMACH (Patient not taking: Reported on 08/28/2018)   No facility-administered encounter medications on file as of 06/10/2020.    Wt Readings from Last 3 Encounters:  05/26/20 134 lb 14.4 oz (61.2 kg)  04/29/20 129 lb 6.6 oz (58.7 kg)  03/09/20 129 lb 4.8 oz (58.7 kg)    Lab Results  Component Value Date   CREATININE 0.96 (H) 05/26/2020   BUN 13 05/26/2020   GFRNONAA 49 (L) 05/26/2020   GFRAA 56 (L) 05/26/2020   NA 146 05/26/2020   K 4.2 05/26/2020   CALCIUM 9.0 05/26/2020   CO2 31 05/26/2020     Current Diagnosis/Assessment:  SDOH Interventions   Flowsheet Row Most Recent Value  SDOH Interventions   Financial Strain Interventions Intervention Not Indicated  Transportation Interventions Intervention Not Indicated      Goals Addressed            This Visit's Progress    Chronic Care Management       CARE PLAN ENTRY (see longitudinal plan of care  for additional care plan information)  Current Barriers:   Chronic Disease Management support, education, and care coordination needs related to Hypertension, Hyperlipidemia, Coronary Artery Disease, GERD, Chronic Kidney Disease, Hypothyroidism, Osteoporosis and Dementia     Hypertension BP Readings from Last 3 Encounters:  05/26/20 116/72  04/29/20 (!) 197/89  03/09/20 118/72    Pharmacist  Clinical Goal(s): o Over the next 90 days, patient will work with PharmD and providers to maintain BP goal <140/90  Current regimen:  o Amlodipine 5 mg daily o Lisinopril 20 mg daily  Interventions: o Discussed low salt diet and exercising as tolerated extensively o Will initiate blood pressure monitoring plan  o Will plan to resume prescribed dose of amlodipine 7.5 mg daily   Patient self care activities - Over the next 90 days, patient will: o Ensure daily salt intake < 2300 mg/day  Hyperlipidemia Lab Results  Component Value Date/Time   LDLCALC 77 05/26/2020 11:18 AM    Pharmacist Clinical Goal(s): o Over the next 90 days, patient will work with PharmD and providers to maintain LDL goal < 100  Current regimen:  o Atorvastatin 10 mg daily  Interventions: o Discussed low cholesterol diet and exercising as tolerated extensively o Will initiate cholesterol monitoring plan   Medication management  Pharmacist Clinical Goal(s): o Over the next 90 days, patient will work with PharmD and providers to achieve optimal medication adherence  Current pharmacy: [facility]  Interventions o Comprehensive medication review performed. o Utilize [facility] for medication synchronization, packaging and delivery o Verbal consent obtained for [facility] enhanced pharmacy services (medication synchronization, adherence packaging, delivery coordination). A medication sync plan was created to allow patient to get all medications delivered once every 30 to 90 days per patient preference. Patient understands they have freedom to choose pharmacy and clinical pharmacist will coordinate care between all prescribers and [facility]. o Will plan to resume Aspirin 81 mg daily and Levothyroxine 50 mcg daily  Patient self care activities - Over the next 90 days, patient will: o Take medications as prescribed o Report any questions or concerns to PharmD and/or provider(s)       Hypertension   BP goal is:  <140/90  Office blood pressures are  BP Readings from Last 3 Encounters:  05/26/20 116/72  04/29/20 (!) 197/89  03/09/20 118/72   Patient checks BP at home infrequently Patient home BP readings are ranging: NA  Patient has failed these meds in the past: NA Patient is currently controlled on the following medications:   Amlodipine 5 mg 1.5 tablets daily (only taking 5 mg daily)  Lisinopril 20 mg daily   We discussed: Patient only taking amlodipine 5 mg daily due to convenience. Denies any symptoms of hypertension or hypotension. They do not routinely monitor blood pressure at home.   Plan  Continue current medications, will plan to resume amlodipine 7.5 mg daily once medications are packaged.    Addendum 06/10/20: After discussion with PCP Karen Grana, PA-C will plan to decrease amlodipine to 5 mg daily to minimize risk of hypotension and reduce pill burden  Hyperlipidemia   History of CAD  LDL goal < 100 given age   Last lipids Lab Results  Component Value Date   CHOL 154 05/26/2020   HDL 60 05/26/2020   LDLCALC 77 05/26/2020   TRIG 85 05/26/2020   CHOLHDL 2.6 05/26/2020   Hepatic Function Latest Ref Rng & Units 05/26/2020 04/29/2020 01/28/2020  Total Protein 6.1 - 8.1 g/dL 6.6 7.2 6.7  Albumin 3.5 - 5.0 g/dL - 3.9 -  AST 10 - 35 U/L _0 ALT 6 - 29 U/L _1 Alk Phosphatase 38 - 126 U/L - 53 -  Total Bilirubin 0.2 - 1.2 mg/dL 0.2 0.3 0.3  Bilirubin, Direct 0.0 - 0.2 mg/dL - 0.1 -     The ASCVD Risk score Mikey Bussing DC Jr., et al., 2013) failed to calculate for the following reasons:   The 2013 ASCVD risk score is only valid for ages 23 to 94   Patient has failed these meds in past: NA Patient is currently controlled on the following medications:   Aspirin 81 mg daily (not taking)  Atorvastatin 10 mg daily   We discussed: Reasonably well controlled given age. Patient has not been taking aspirin at home.   Plan  Restart  Aspirin 81 mg daily   Hypothyroidism   Lab Results  Component Value Date/Time   TSH 8.92 (H) 05/26/2020 11:18 AM   TSH 1.80 03/09/2020 03:28 PM    Patient has failed these meds in past: NA Patient is currently uncontrolled on the following medications:   Levothyroxine 50 mcg daily (has not been taking)   We discussed:  Patient has not been taking her levothyroxine. She denies symptoms of fatigue, cold sensitivity, weight gain, or hair loss.   Will plan to have her restart levothyroxine and take with her other medications for convenience.    Plan  Restart Levothyroxine 50 mcg daily   Misc / OTC    Miralax 17 g 1/2-1 cap daily PRN (hasn't used recently)  Plan  Continue current medications  Vaccines   Reviewed and discussed patient's vaccination history.    Immunization History  Administered Date(s) Administered   Fluad Quad(high Dose 65+) 04/11/2019, 05/26/2020   Influenza Split 03/24/2011   Medication Management   Patient's preferred pharmacy is:  [facility] - [place], [place] - [address] [place] [address] Phone: [phone] Fax: [phone]  Patient has significant gaps in fill history for all chronic medications   Uses pill box? No - Doesn't understand how to set up Pt endorses 33% compliance  We discussed: Discussed benefits of medication synchronization, packaging and delivery as well as enhanced pharmacist oversight with [facility].  Plan  Utilize [facility] for medication synchronization, packaging and delivery  Verbal consent obtained for [facility] enhanced pharmacy services (medication synchronization, adherence packaging, delivery coordination). A medication sync plan was created to allow patient to get all medications delivered once every 30 to 90 days per patient preference. Patient understands they have freedom to choose pharmacy and clinical pharmacist will  coordinate care between all prescribers and [facility].  Will plan for acute fill of Levothyroxine and Aspirin until compliance packaging can be set up.   Follow up: 12 month phone visit  [facility] [phone]

## 2020-06-08 NOTE — Progress Notes (Signed)
    Chronic Care Management Pharmacy Assistant   Name: Karen Davila  MRN: [id number] DOB: May 05, 1918  Reason for Encounter: Medication Review  Patient Questions:  1.  Have you seen any other providers since your last visit? Yes, Karen Davila (PCP), Karen Davila (Neurology)  2.  Any changes in your medicines or health? Yes, pt was off of levothyroxine medication and had to restart, multiple very high blood pressure readings, constipation, lumbar compression fractures   PCP : [name], PA-C  Allergies:  No Known Allergies  Medications: Outpatient Encounter Medications as of 06/08/2020  Medication Sig  . amLODipine (NORVASC) 5 MG tablet TAKE 1 & 1/2 (ONE & ONE-HALF) TABLETS BY MOUTH DAILY  . aspirin EC 81 MG tablet Take 81 mg by mouth daily.  [name] atorvastatin (LIPITOR) 10 MG tablet Take 1 tablet (10 mg total) by mouth daily.  [name] levothyroxine (SYNTHROID) 50 MCG tablet Take 1 tablet (50 mcg total) by mouth daily.  [name] lisinopril (ZESTRIL) 20 MG tablet Take 1 tablet (20 mg total) by mouth daily.  . polyethylene glycol (MIRALAX) 17 g packet Take 1/2 a cap to 1 cap dissolved in clear liquid, po, once daily as needed for constipation or hard stool  . [DISCONTINUED] ranitidine (ZANTAC) 150 MG tablet TAKE 1 TABLET BY MOUTH AT BEDTIME FOR STOMACH (Patient not taking: Reported on 08/28/2018)   No facility-administered encounter medications on file as of 06/08/2020.    Current Diagnosis: Patient Active Problem List   Diagnosis Date Noted  . Anemia 06/15/2018  . Atherosclerosis of aorta (HCC) 05/16/2018  . Venous insufficiency 08/08/2017  . Lichen simplex chronicus 08/08/2017  . Swelling of limb 05/30/2017  . Left arm pain 08/30/2016  . Bilateral leg cramps 07/06/2016  . Chronic kidney disease, stage 3 (HCC) 07/07/2015  . Cardiac murmur, previously undiagnosed 04/06/2015  . Dementia (HCC) 04/06/2015  . Difficulty hearing 04/06/2015  . OP (osteoporosis) 04/06/2015  . Hypertension 12/23/2014   . GERD (gastroesophageal reflux disease) 12/23/2014  . Adult hypothyroidism 12/23/2014  . Hyperlipidemia 12/23/2014  . Iron deficiency anemia 12/23/2014    Goals Addressed   None     Follow-Up:  Pharmacist Review..  Any side effects from any medications? no Do you have an symptoms or problems not managed by your medications? no Any concerns about your health right now? Yes mobility Has your provider asked that you check blood pressure, blood sugar, or follow special diet at home? No, pt has always watched what she ate.  Karen Davila does say that she is a little more picky as she is older Do you get any type of exercise on a regular basis? Pt walks the hallways multiple times, recent switch from walking around the block. Pt does all of her own cleaning and cooking Can you think of a goal you would like to reach for your health? Increase mobility Do you have any problems getting your medications? Karen Davila makes multiple trips to pick up medication.  Is there anything that you would like to discuss during the appointment?Casimiro Needle would like to have Ms Ahlgren set up on compliance packaging.    Please bring medications and supplements to appointment

## 2020-06-10 ENCOUNTER — Other Ambulatory Visit: Payer: Self-pay | Admitting: Family Medicine

## 2020-06-10 ENCOUNTER — Ambulatory Visit: Payer: Medicare HMO

## 2020-06-10 ENCOUNTER — Other Ambulatory Visit: Payer: Self-pay

## 2020-06-10 DIAGNOSIS — E785 Hyperlipidemia, unspecified: Secondary | ICD-10-CM

## 2020-06-10 DIAGNOSIS — I10 Essential (primary) hypertension: Secondary | ICD-10-CM

## 2020-06-10 DIAGNOSIS — E7800 Pure hypercholesterolemia, unspecified: Secondary | ICD-10-CM

## 2020-06-10 MED ORDER — ATORVASTATIN CALCIUM 10 MG PO TABS: 10.0000 mg | ORAL_TABLET | Freq: Every day | ORAL | 3 refills | Status: DC

## 2020-06-10 MED ORDER — LISINOPRIL 20 MG PO TABS: 20.0000 mg | ORAL_TABLET | Freq: Every day | ORAL | 3 refills | Status: DC

## 2020-06-10 MED ORDER — LEVOTHYROXINE SODIUM 50 MCG PO TABS: 50.0000 ug | ORAL_TABLET | Freq: Every day | ORAL | 3 refills | Status: DC

## 2020-06-10 MED ORDER — AMLODIPINE BESYLATE 5 MG PO TABS: 5.0000 mg | ORAL_TABLET | Freq: Every day | ORAL | 3 refills | Status: DC

## 2020-06-10 NOTE — Progress Notes (Unsigned)
Discussed with Trinna Post -

## 2020-06-10 NOTE — Patient Instructions (Addendum)
Visit Information It was great speaking with you today!  Please let me know if you have any questions about our visit.  Goals Addressed            This Visit's Progress   . Chronic Care Management       CARE PLAN ENTRY (see longitudinal plan of care for additional care plan information)  Current Barriers:  . Chronic Disease Management support, education, and care coordination needs related to Hypertension, Hyperlipidemia, Coronary Artery Disease, GERD, Chronic Kidney Disease, Hypothyroidism, Osteoporosis and Dementia     Hypertension BP Readings from Last 3 Encounters:  05/26/20 116/72  04/29/20 (!) 197/89  03/09/20 118/72   . Pharmacist Clinical Goal(s): o Over the next 90 days, patient will work with PharmD and providers to maintain BP goal <140/90 . Current regimen:  o Amlodipine 5 mg daily o Lisinopril 20 mg daily . Interventions: o Discussed low salt diet and exercising as tolerated extensively o Will initiate blood pressure monitoring plan  o Will plan to resume prescribed dose of amlodipine 7.5 mg daily  . Patient self care activities - Over the next 90 days, patient will: o Ensure daily salt intake < 2300 mg/day  Hyperlipidemia Lab Results  Component Value Date/Time   LDLCALC 77 05/26/2020 11:18 AM   . Pharmacist Clinical Goal(s): o Over the next 90 days, patient will work with PharmD and providers to maintain LDL goal < 100 . Current regimen:  o Atorvastatin 10 mg daily . Interventions: o Discussed low cholesterol diet and exercising as tolerated extensively o Will initiate cholesterol monitoring plan   Medication management . Pharmacist Clinical Goal(s): o Over the next 90 days, patient will work with PharmD and providers to achieve optimal medication adherence . Current pharmacy: [facility] . Interventions o Comprehensive medication review performed. o Utilize [facility] for medication synchronization, packaging and delivery o Verbal consent  obtained for [facility] enhanced pharmacy services (medication synchronization, adherence packaging, delivery coordination). A medication sync plan was created to allow patient to get all medications delivered once every 30 to 90 days per patient preference. Patient understands they have freedom to choose pharmacy and clinical pharmacist will coordinate care between all prescribers and [facility]. o Will plan to resume Aspirin 81 mg daily and Levothyroxine 50 mcg daily . Patient self care activities - Over the next 90 days, patient will: o Take medications as prescribed o Report any questions or concerns to PharmD and/or provider(s)       Ms. Weisensel was given information about Chronic Care Management services today including:  1. CCM service includes personalized support from designated clinical staff supervised by her physician, including individualized plan of care and coordination with other care providers 2. 24/7 contact phone numbers for assistance for urgent and routine care needs. 3. Standard insurance, coinsurance, copays and deductibles apply for chronic care management only during months in which we provide at least 20 minutes of these services. Most insurances cover these services at 100%, however patients may be responsible for any copay, coinsurance and/or deductible if applicable. This service may help you avoid the need for more expensive face-to-face services. 4. Only one practitioner may furnish and bill the service in a calendar month. 5. The patient may stop CCM services at any time (effective at the end of the month) by phone call to the office staff.  Patient agreed to services and verbal consent obtained.   Print copy of patient instructions, educational materials, and care plan provided in person.  The pharmacy team will reach out to the patient again over the next 14 days.   Garey Ham Clinical Pharmacist [facility] [phone]

## 2020-07-16 ENCOUNTER — Other Ambulatory Visit: Payer: Self-pay | Admitting: Family Medicine

## 2020-07-16 NOTE — Progress Notes (Signed)
Asked to again enter [facility] orders - done in August, never received - pt did CCM SW consult, subsequent f/up visits 03/09/2020 ad 05/26/2020 and ER visit with fall and compression fx  HH orders entered again today after receiving outside message stating: She is in need of RN, PT, OT, HHA.

## 2020-07-16 NOTE — Addendum Note (Signed)
Addended by: [name] on: 07/16/2020 09:06 PM   Modules accepted: Orders

## 2020-07-23 ENCOUNTER — Other Ambulatory Visit: Payer: Self-pay | Admitting: Family Medicine

## 2020-07-23 DIAGNOSIS — E7800 Pure hypercholesterolemia, unspecified: Secondary | ICD-10-CM

## 2020-07-23 DIAGNOSIS — I10 Essential (primary) hypertension: Secondary | ICD-10-CM

## 2020-07-23 NOTE — Telephone Encounter (Signed)
I refused refills for this pt of Karen Davila because all of these were refilled by [facility]'s pharmacist on 06/10/2020 #90 with 3 refills on all 4 of these medications listed:  Amlodipine 5 mg  Atorvastatin 10  Synthroid 50 mcg  Lisinopril 20 mg   Sent as Lorain Childes

## 2020-07-23 NOTE — Telephone Encounter (Signed)
Medication Refill - Medication:   amLODipine (NORVASC) 5 MG tablet   atorvastatin (LIPITOR) 10 MG tablet   levothyroxine (SYNTHROID) 50 MCG tablet   lisinopril (ZESTRIL) 20 MG tablet     Has the patient contacted their pharmacy? Yes.  caller states they tried many times, VM is full and PT has completely run out of all her medication.    Preferred Pharmacy (with phone number or street name):  [facility] - [place], [place] - [address]  [address] [place] [address]  Phone: [phone] Fax: [phone]    Agent: Please be advised that RX refills may take up to 3 business days. We ask that you follow-up with your pharmacy.

## 2020-07-24 NOTE — Addendum Note (Signed)
Addended by: Hasel Janish, Sherrill Raring on: 07/24/2020 02:30 PM   Modules accepted: Orders

## 2020-07-24 NOTE — Telephone Encounter (Signed)
Spoke to Jenera the grandson. He want all meds to a new pharmacy that will blister wrap all her medication. Meds pend to you for refill

## 2020-07-27 ENCOUNTER — Telehealth: Payer: Self-pay

## 2020-07-27 ENCOUNTER — Other Ambulatory Visit: Payer: Self-pay | Admitting: Family Medicine

## 2020-07-27 DIAGNOSIS — I10 Essential (primary) hypertension: Secondary | ICD-10-CM

## 2020-07-27 NOTE — Telephone Encounter (Signed)
Copied from CRM [id number]. Topic: General - Other >> Jul 27, 2020  1:30 PM Pawlus, Maxine Glenn A wrote: Reason for CRM: Pts grandson called in regarding clarifiaction on his grandmothers perscriptions. Caller states he is unable to get in contact with the [facility]. Please advise.

## 2020-07-27 NOTE — Telephone Encounter (Signed)
Karen Davila notifed and has been trying to send scripts to [facility]

## 2020-07-27 NOTE — Chronic Care Management (AMB) (Signed)
Chronic Care Management Pharmacy Assistant   Name: SHEQUILA NEGLIA  MRN: [id number] DOB: 1917/10/03  Reason for Encounter: Medication Review  Patient Questions:  1.  Have you seen any other providers since your last visit? No  2.  Any changes in your medicines or health? No    PCP : [name], PA-C  Allergies:  No Known Allergies  Medications: Outpatient Encounter Medications as of 07/27/2020  Medication Sig   aspirin EC 81 MG tablet Take 81 mg by mouth daily. (Patient not taking: Reported on 06/10/2020)   lisinopril (ZESTRIL) 20 MG tablet Take 1 tablet by mouth once daily   polyethylene glycol (MIRALAX) 17 g packet Take 1/2 a cap to 1 cap dissolved in clear liquid, po, once daily as needed for constipation or hard stool   [DISCONTINUED] amLODipine (NORVASC) 5 MG tablet Take 1 tablet (5 mg total) by mouth daily.   [DISCONTINUED] atorvastatin (LIPITOR) 10 MG tablet Take 1 tablet (10 mg total) by mouth daily.   [DISCONTINUED] levothyroxine (SYNTHROID) 50 MCG tablet Take 1 tablet (50 mcg total) by mouth daily.   [DISCONTINUED] ranitidine (ZANTAC) 150 MG tablet TAKE 1 TABLET BY MOUTH AT BEDTIME FOR STOMACH (Patient not taking: Reported on 08/28/2018)   No facility-administered encounter medications on file as of 07/27/2020.    Current Diagnosis: Patient Active Problem List   Diagnosis Date Noted   Anemia 06/15/2018   Atherosclerosis of aorta (HCC) 05/16/2018   Venous insufficiency 08/08/2017   Lichen simplex chronicus 08/08/2017   Swelling of limb 05/30/2017   Left arm pain 08/30/2016   Bilateral leg cramps 07/06/2016   Chronic kidney disease, stage 3 (HCC) 07/07/2015   Cardiac murmur, previously undiagnosed 04/06/2015   Dementia (HCC) 04/06/2015   Difficulty hearing 04/06/2015   OP (osteoporosis) 04/06/2015   Hypertension 12/23/2014   GERD (gastroesophageal reflux disease) 12/23/2014   Adult hypothyroidism 12/23/2014   Hyperlipidemia 12/23/2014    Iron deficiency anemia 12/23/2014   Reviewed chart for medication changes ahead of medication coordination call.  No OVs, Consults, or hospital visits since last care coordination call/Pharmacist visit.  No medication changes indicated.  BP Readings from Last 3 Encounters:  08/03/20 (!) 124/54  05/26/20 116/72  04/29/20 (!) 197/89    No results found for: HGBA1C   Patient obtains medications through Adherence Packaging  30 Days   Last adherence delivery included: Patient onboarded to [facility] 06/10/2020.   07/27/2020- Patient son Casimiro Needle called stated patient is out of medications and looking for her to start her packaging program with [facility]. Called [facility], spoke with Leotis Shames and per pharmacy patient should have had enough medications until 08/24/2020, she received a 90 Day supply of medications on 05/26/2020 from [facility] and they can not file insurance again until 08/09/2020. Called son back to inform, he is not sure of where patient's medications were he did mention her Dementia was getting worse that's why he would like her to be in the packages. He is willing to pay out of pocket for her medications, she will need them today. Called [facility] back, they will get a 30 day supply of medications ready and will call son with out of pocket prices. Acute fill form sent to [facility], preparing delivery intake form for 08/24/2020 so patient can transition to packages which will help her adhere to taking her medications as prescribed.   Patient is due for next adherence delivery on: 08/24/2020.  This delivery to include: Amlodipine 5 mg- 1 tablet daily (  breakfast) Atorvastatin 10 mg- 1 tablet daily (breakfast) Levothyroxine 50 mcg- 1 tablet daily (before breakfast) Lisinopril 20 mg- 1 tablet daily (breakfast) Aspirin 81 mg- 1 tablet daily (breakfast)  No acute fill or short fill needed.  Patient declined the following medications: Miralax 17g- use daily as needed-  patient son will call when needed.  Patient needs refills for- None  Confirmed delivery date of 08/24/2020, advised patient that pharmacy will contact them the morning of delivery.  Follow-Up:  Occupational hygienist and Pharmacist Review  Angelena Sole, CPP notified.  08/07/2020- Before intake form placed, noticed patient had an ED visit on 08/03/2020 [facility]), started Cephalexin 250 mg- 1 tablet three times a day which was delivered by [facility] on 08/04/2020.   Billee Cashing, CMA Clinical Pharmacist Assistant [phone]

## 2020-07-27 NOTE — Telephone Encounter (Signed)
Can one of you please change her meds to [facility] and you might want to ask Trinna Post if they need them changed to monthly or if they can stay the way they are

## 2020-07-27 NOTE — Telephone Encounter (Signed)
I spoke to [facility] again and he is sure [facility]

## 2020-07-28 ENCOUNTER — Ambulatory Visit: Payer: Self-pay

## 2020-07-28 NOTE — Telephone Encounter (Signed)
Patient's god son Casimiro Needle (on [place]) called and says the patient is having right lower leg swelling from her knee to her feet. He says the leg is noticeably larger than the right and he noticed it today when he was rubbing lotion on her leg. He says she was moaning in pain, so he gave her Ibuprofen 200 mg, 1 tablet and it relieved the pain. He says no redness, but the leg is dark in color. He denies any other symptoms. Advised no availability with PCP. No answer at the office, office closed. Advised this will be sent to the office and someone will call back to schedule an appointment. He verbalized understanding.    Reason for Disposition . [1] MODERATE leg swelling (e.g., swelling extends up to knees) AND [2] new-onset or worsening  Answer Assessment - Initial Assessment Questions 1. ONSET: "When did the swelling start?" (e.g., minutes, hours, days)     Not sure, noticed it today 2. LOCATION: "What part of the leg is swollen?"  "Are both legs swollen or just one leg?"     Right Lower leg from knee to foot 3. SEVERITY: "How bad is the swelling?" (e.g., localized; mild, moderate, severe)  - Localized - small area of swelling localized to one leg  - MILD pedal edema - swelling limited to foot and ankle, pitting edema < 1/4 inch (6 mm) deep, rest and elevation eliminate most or all swelling  - MODERATE edema - swelling of lower leg to knee, pitting edema > 1/4 inch (6 mm) deep, rest and elevation only partially reduce swelling  - SEVERE edema - swelling extends above knee, facial or hand swelling present      Moderate 4. REDNESS: "Does the swelling look red or infected?"     Not noticed, just dark 5. PAIN: "Is the swelling painful to touch?" If Yes, ask: "How painful is it?"   (Scale 1-10; mild, moderate or severe)     Severe 6. FEVER: "Do you have a fever?" If Yes, ask: "What is it, how was it measured, and when did it start?"      No 7. CAUSE: "What do you think is causing the leg swelling?"      No idea 8. MEDICAL HISTORY: "Do you have a history of heart failure, kidney disease, liver failure, or cancer?"      No 9. RECURRENT SYMPTOM: "Have you had leg swelling before?" If Yes, ask: "When was the last time?" "What happened that time?"     No swelling, but pain 10. OTHER SYMPTOMS: "Do you have any other symptoms?" (e.g., chest pain, difficulty breathing)       No 11. PREGNANCY: "Is there any chance you are pregnant?" "When was your last menstrual period?"       No  Protocols used: LEG SWELLING AND EDEMA-A-AH

## 2020-07-29 MED ORDER — AMLODIPINE BESYLATE 5 MG PO TABS: 5.0000 mg | ORAL_TABLET | Freq: Every day | ORAL | 3 refills | Status: DC

## 2020-07-29 MED ORDER — ATORVASTATIN CALCIUM 10 MG PO TABS: 10.0000 mg | ORAL_TABLET | Freq: Every day | ORAL | 3 refills | Status: DC

## 2020-07-29 MED ORDER — LISINOPRIL 20 MG PO TABS: 20.0000 mg | ORAL_TABLET | Freq: Every day | ORAL | 3 refills | Status: DC

## 2020-07-29 MED ORDER — LEVOTHYROXINE SODIUM 50 MCG PO TABS: 50.0000 ug | ORAL_TABLET | Freq: Every day | ORAL | 3 refills | Status: DC

## 2020-07-29 NOTE — Telephone Encounter (Signed)
Asher Muir called pts grandson Casimiro Needle and advised him to take pt to the ER. Grandson understood directions

## 2020-07-30 ENCOUNTER — Telehealth: Payer: Self-pay

## 2020-07-30 MED ORDER — LISINOPRIL 20 MG PO TABS: 20.0000 mg | ORAL_TABLET | Freq: Every day | ORAL | 3 refills | Status: DC

## 2020-07-30 MED ORDER — ATORVASTATIN CALCIUM 10 MG PO TABS: 10.0000 mg | ORAL_TABLET | Freq: Every day | ORAL | 3 refills | Status: AC

## 2020-07-30 MED ORDER — AMLODIPINE BESYLATE 5 MG PO TABS: 5.0000 mg | ORAL_TABLET | Freq: Every day | ORAL | 3 refills | Status: AC

## 2020-07-30 MED ORDER — LEVOTHYROXINE SODIUM 50 MCG PO TABS: 50.0000 ug | ORAL_TABLET | Freq: Every day | ORAL | 3 refills | Status: AC

## 2020-07-30 NOTE — Progress Notes (Signed)
  Please Void Note, open in error.  [name] Clinical Pharmacist Assistant [phone]

## 2020-07-30 NOTE — Addendum Note (Signed)
Addended by: Davene Costain on: 07/30/2020 01:02 PM   Modules accepted: Orders

## 2020-08-03 ENCOUNTER — Other Ambulatory Visit: Payer: Self-pay

## 2020-08-03 ENCOUNTER — Emergency Department
Admission: EM | Admit: 2020-08-03 | Discharge: 2020-08-03 | Disposition: A | Discharge status: Home / Self Care | Payer: Medicare HMO | Attending: Emergency Medicine | Admitting: Emergency Medicine

## 2020-08-03 DIAGNOSIS — X58XXXA Exposure to other specified factors, initial encounter: Secondary | ICD-10-CM | POA: Insufficient documentation

## 2020-08-03 DIAGNOSIS — Z79899 Other long term (current) drug therapy: Secondary | ICD-10-CM | POA: Insufficient documentation

## 2020-08-03 DIAGNOSIS — I129 Hypertensive chronic kidney disease with stage 1 through stage 4 chronic kidney disease, or unspecified chronic kidney disease: Secondary | ICD-10-CM | POA: Diagnosis not present

## 2020-08-03 DIAGNOSIS — F0390 Unspecified dementia without behavioral disturbance: Secondary | ICD-10-CM | POA: Diagnosis not present

## 2020-08-03 DIAGNOSIS — M79652 Pain in left thigh: Secondary | ICD-10-CM | POA: Diagnosis not present

## 2020-08-03 DIAGNOSIS — S61202A Unspecified open wound of right middle finger without damage to nail, initial encounter: Secondary | ICD-10-CM | POA: Diagnosis not present

## 2020-08-03 DIAGNOSIS — R234 Changes in skin texture: Secondary | ICD-10-CM | POA: Diagnosis not present

## 2020-08-03 DIAGNOSIS — E039 Hypothyroidism, unspecified: Secondary | ICD-10-CM | POA: Diagnosis not present

## 2020-08-03 DIAGNOSIS — M79651 Pain in right thigh: Secondary | ICD-10-CM | POA: Insufficient documentation

## 2020-08-03 DIAGNOSIS — L304 Erythema intertrigo: Secondary | ICD-10-CM

## 2020-08-03 DIAGNOSIS — N183 Chronic kidney disease, stage 3 unspecified: Secondary | ICD-10-CM | POA: Insufficient documentation

## 2020-08-03 DIAGNOSIS — S6991XA Unspecified injury of right wrist, hand and finger(s), initial encounter: Secondary | ICD-10-CM | POA: Diagnosis present

## 2020-08-03 MED ORDER — CEPHALEXIN 250 MG PO CAPS: 250.0000 mg | ORAL_CAPSULE | Freq: Three times a day (TID) | ORAL | 0 refills | Status: AC

## 2020-08-03 NOTE — ED Triage Notes (Signed)
Pt comes with c/o left middle finger injury with open wound. No bleeding noted. Pt states she doesn't know how she did it. Pt also states pain to left leg. Pt denies any recent injuries or falls.

## 2020-08-03 NOTE — ED Notes (Signed)
Provided DC instructions. Verbalized understanding.  

## 2020-08-03 NOTE — ED Triage Notes (Signed)
First Nurse note:  Arrives with [facility].  Patient c/o leg pain and cut to right index finger knuckle.    Patient is Awake and alert.  NAD.  Skin warm and dry.

## 2020-08-03 NOTE — Discharge Instructions (Addendum)
You were seen today for an open wound to the knuckle of your right hand.  This area was cleansed and glued, you are placed in a splint for protection.  Please wear the splint for the next 48 hours.,  You may take off while showering.  Your bilateral thigh pain is secondary to chafing. There is not much we can do to treat this. Please apply moisturizing lotion or Gold Bond Medicated Powder to affected area. Follow up with your PCP if symptoms persist or worsen

## 2020-08-03 NOTE — ED Provider Notes (Signed)
[facility] Emergency Department Provider Note ____________________________________________  Time seen: 1115  I have reviewed the triage vital signs and the nursing notes.  HISTORY  Chief Complaint  Finger Injury and Leg Pain   HPI Karen Davila is a 87 y.o. female presents to the ER today with complaint of open wound to her right middle finger.  She reports this occurred 1 week ago.  She has no idea how it happened.  She reports it initially bled but has not had any drainage since that time.  She reports every time she bends her finger, the wound pops back open.  She has not used anything OTC for this.  She also reports pain of her bilateral thighs.  She describes the pain as burning.  The pain is worse when she tries to ambulate.  She has noticed a rash of this area.  She denies any injury to her back, or recent fall.  She has not tried anything OTC for this.  Past Medical History:  Diagnosis Date  . Arthritis   . Chronic kidney disease   . GERD (gastroesophageal reflux disease)   . Hyperlipidemia   . Hypertension     Patient Active Problem List   Diagnosis Date Noted  . Anemia 06/15/2018  . Atherosclerosis of aorta (HCC) 05/16/2018  . Venous insufficiency 08/08/2017  . Lichen simplex chronicus 08/08/2017  . Swelling of limb 05/30/2017  . Left arm pain 08/30/2016  . Bilateral leg cramps 07/06/2016  . Chronic kidney disease, stage 3 (HCC) 07/07/2015  . Cardiac murmur, previously undiagnosed 04/06/2015  . Dementia (HCC) 04/06/2015  . Difficulty hearing 04/06/2015  . OP (osteoporosis) 04/06/2015  . Hypertension 12/23/2014  . GERD (gastroesophageal reflux disease) 12/23/2014  . Adult hypothyroidism 12/23/2014  . Hyperlipidemia 12/23/2014  . Iron deficiency anemia 12/23/2014    Past Surgical History:  Procedure Laterality Date  . EYE SURGERY Right    Cataract    Prior to Admission medications   Medication Sig Start Date End Date Taking?  Authorizing Provider  amLODipine (NORVASC) 5 MG tablet Take 1 tablet (5 mg total) by mouth daily. 07/30/20   [name], PA-C  aspirin EC 81 MG tablet Take 81 mg by mouth daily. Patient not taking: Reported on 06/10/2020    [provider]  atorvastatin (LIPITOR) 10 MG tablet Take 1 tablet (10 mg total) by mouth daily. 07/30/20   [name], PA-C  levothyroxine (SYNTHROID) 50 MCG tablet Take 1 tablet (50 mcg total) by mouth daily. 07/30/20   [name], PA-C  lisinopril (ZESTRIL) 20 MG tablet Take 1 tablet by mouth once daily 07/27/20   [name], PA-C  lisinopril (ZESTRIL) 20 MG tablet Take 1 tablet (20 mg total) by mouth daily. 07/30/20   [name], PA-C  polyethylene glycol (MIRALAX) 17 g packet Take 1/2 a cap to 1 cap dissolved in clear liquid, po, once daily as needed for constipation or hard stool 05/26/20   [name], PA-C  ranitidine (ZANTAC) 150 MG tablet TAKE 1 TABLET BY MOUTH AT BEDTIME FOR STOMACH Patient not taking: Reported on 08/28/2018 02/22/18 11/30/18  Kerman Passey, MD    Allergies Patient has no known allergies.  Family History  Problem Relation Age of Onset  . Stroke Mother   . Healthy Father     Social History Social History   Tobacco Use  . Smoking status: Never Smoker  . Smokeless tobacco: Never Used  . Tobacco comment: smoking cessation materials not required  Vaping Use  . Vaping Use: Never used  Substance Use Topics  . Alcohol use: No    Alcohol/week: 0.0 standard drinks  . Drug use: No    Review of Systems  Constitutional: Negative for fever, chills or body aches. Cardiovascular: Negative for chest pain or chest tightness. Respiratory: Negative for cough or shortness of breath. Musculoskeletal: Positive for bilateral thigh pain.  Negative for back pain, hip or knee. Skin: Positive for open wound to right middle finger, rash of bilateral thighs. Neurological: Negative for focal weakness, tingling or  numbness. ____________________________________________  PHYSICAL EXAM:  VITAL SIGNS: ED Triage Vitals [08/03/20 1110]  Enc Vitals Group     BP (!) 124/54     Pulse Rate 74     Resp 19     Temp 98.1 F (36.7 C)     Temp src      SpO2 100 %     Weight 150 lb (68 kg)     Height 5\' 6"  (1.676 m)     Head Circumference      Peak Flow      Pain Score 7     Pain Loc      Pain Edu?      Excl. in GC?     Constitutional: Alert and oriented. In no distress. Head: Normocephalic and atraumatic. Eyes: Normal extraocular movements Cardiovascular: Normal rate, regular rhythm.  Respiratory: Normal respiratory effort. No wheezes/rales/rhonchi. Musculoskeletal: Decreased flexion of the right middle finger.  Normal extension.  Normal passive abduction, abduction, internal and external rotation of bilateral hips. Neurologic: Hard of hearing.  She has difficulty remembering things.  Alert and oriented to person, place and time. Skin: Open fissure over PIP right middle finger.  Chafing of bilateral thighs.   INITIAL IMPRESSION / ASSESSMENT AND PLAN / ED COURSE  Fissure, Right Middle Finger:  Non healing Area cleansed with Iodine x 3 Wound glued with surgical adhesive Finger placed in splint for protection Pt tolerated well, no complications RX for Keflex 250 mg TID x 5 days  Thigh Chaffing:  No open wounds Recommend moisturizing lotion/Gold Bond medicated powders  Final Impressions:  Fissure of Skin Chaffing   , NP 08/03/20 1215    08/05/20, MD 08/03/20 1501

## 2020-08-05 ENCOUNTER — Telehealth: Payer: Self-pay

## 2020-08-05 NOTE — Telephone Encounter (Signed)
Is this ok and could you possibly put in documentation in last note from December?

## 2020-08-05 NOTE — Telephone Encounter (Signed)
Pts grandson

## 2020-08-05 NOTE — Telephone Encounter (Signed)
Pt does not have home health, family takes care of her and she is getting weaker they want to see about getting a walker not wheelchair.  A walker that has brakes and a seat.

## 2020-08-05 NOTE — Telephone Encounter (Signed)
Please check the recent telephone encounters in orders I believe I just ordered home health again on her  Without a home health assessment or a recent office visit to assess her changes in gate and mobility and her weakness I won't have a medical documentation to support the wheelchair order.

## 2020-08-05 NOTE — Telephone Encounter (Signed)
Pt needs a RX for a wheelchair to be sent to [facility]. Please call pts grandson at [phone]

## 2020-08-06 NOTE — Telephone Encounter (Signed)
Talked with Lucila Maine he states they refused HH due to cost, She already has an appt 3/28 and will discuss then unless they decide to buy on there on.

## 2020-08-28 ENCOUNTER — Telehealth: Payer: Self-pay

## 2020-08-28 NOTE — Chronic Care Management (AMB) (Signed)
Chronic Care Management Pharmacy Assistant   Name: KASHMERE DAYWALT  MRN: [id number] DOB: [birth year]  Reason for Encounter: Medication Review   Recent office visits:  None  Recent consult visits:  None  Hospital visits:  Medication Reconciliation was completed by comparing discharge summary, patient's EMR and Pharmacy list, and upon discussion with patient.  Admitted to the hospital on 08/03/2020 due to Fissure of skin and Chafing . Discharge date was 08/03/2020. Discharged from [facility] Emergency Department.    New?Medications Started at [facility] Discharge:?? -started Cephalexin 250 mg- 1 tablet three times a day due to Fissure of skin and Chafing  Medication Changes at Hospital Discharge: -Changed- None  Medications Discontinued at Hospital Discharge: -Stopped - None  Medications that remain the same after Hospital Discharge:??  -All other medications will remain the same.    Reviewed chart for medication changes ahead of medication coordination call.  Medication changes indicated under Hospital Visits above.  BP Readings from Last 3 Encounters:  08/03/20 (!) 124/54  05/26/20 116/72  04/29/20 (!) 197/89    No results found for: HGBA1C   Patient obtains medications through Adherence Packaging  30 Days   Last adherence delivery included: Amlodipine 5 mg- 1 tablet daily (breakfast) Atorvastatin 10 mg- 1 tablet daily (breakfast) Levothyroxine 50 mcg- 1 tablet daily (before breakfast) Lisinopril 20 mg- 1 tablet daily (breakfast) Aspirin 81 mg- 1 tablet daily (breakfast)  Patient declined the following medications last month:  Miralax 17g- use daily as needed- patient son will call when needed.  Patient is due for next adherence delivery on: 09/04/2020. Called patient grandson Casimiro Needle and reviewed medications and coordinated delivery. No answer, left message to return call.  Patient received her 30 day packaging system on 08/21/2020.   This  delivery to include: No fill needed  No Short fill or acute fill needed.  Patient declined the following medications:  Amlodipine 5 mg- 1 tablet daily (breakfast) Atorvastatin 10 mg- 1 tablet daily (breakfast) Levothyroxine 50 mcg- 1 tablet daily (before breakfast) Lisinopril 20 mg- 1 tablet daily (breakfast) Aspirin 81 mg- 1 tablet daily (breakfast)   Due to receiving a 30 day supply on 08/21/2020 Miralax 17g- use daily as needed- patient son will call when needed.  Patient needs refills for None.  Spoke with son Casimiro Needle, he is aware that we will call him and patient next month to follow up on any medications needs.  Medications: Outpatient Encounter Medications as of 08/28/2020  Medication Sig  . amLODipine (NORVASC) 5 MG tablet Take 1 tablet (5 mg total) by mouth daily.  [name] aspirin EC 81 MG tablet Take 81 mg by mouth daily. (Patient not taking: Reported on 06/10/2020)  . atorvastatin (LIPITOR) 10 MG tablet Take 1 tablet (10 mg total) by mouth daily.  [name] levothyroxine (SYNTHROID) 50 MCG tablet Take 1 tablet (50 mcg total) by mouth daily.  [name] lisinopril (ZESTRIL) 20 MG tablet Take 1 tablet by mouth once daily  . lisinopril (ZESTRIL) 20 MG tablet Take 1 tablet (20 mg total) by mouth daily.  . polyethylene glycol (MIRALAX) 17 g packet Take 1/2 a cap to 1 cap dissolved in clear liquid, po, once daily as needed for constipation or hard stool  . [DISCONTINUED] ranitidine (ZANTAC) 150 MG tablet TAKE 1 TABLET BY MOUTH AT BEDTIME FOR STOMACH (Patient not taking: Reported on 08/28/2018)   No facility-administered encounter medications on file as of 08/28/2020.    Star Rating Drugs: Atorvastatin 10 mg- Last filled 07/27/2020  for 30 day supply at [facility] Lisinopril 20 mg- Last filled 07/27/2020 for 30 day supply at [facility].  SIG: Billee Cashing, CMA Clinical Pharmacist Assistant [phone]

## 2020-09-04 ENCOUNTER — Encounter: Payer: Self-pay | Admitting: Physician Assistant

## 2020-09-04 ENCOUNTER — Other Ambulatory Visit: Payer: Self-pay

## 2020-09-04 ENCOUNTER — Ambulatory Visit (INDEPENDENT_AMBULATORY_CARE_PROVIDER_SITE_OTHER): Payer: Medicare HMO | Admitting: Physician Assistant

## 2020-09-04 VITALS — BP 118/72 | HR 84 | Temp 98.0°F | Resp 14 | Wt 139.0 lb

## 2020-09-04 DIAGNOSIS — E039 Hypothyroidism, unspecified: Secondary | ICD-10-CM

## 2020-09-04 DIAGNOSIS — E785 Hyperlipidemia, unspecified: Secondary | ICD-10-CM

## 2020-09-04 DIAGNOSIS — I10 Essential (primary) hypertension: Secondary | ICD-10-CM

## 2020-09-04 LAB — TSH: TSH: 2.35 mIU/L (ref 0.40–4.50)

## 2020-09-04 NOTE — Patient Instructions (Signed)
Hypothyroidism  Hypothyroidism is when the thyroid gland does not make enough of certain hormones (it is underactive). The thyroid gland is a small gland located in the lower front part of the neck, just in front of the windpipe (trachea). This gland makes hormones that help control how the body uses food for energy (metabolism) as well as how the heart and brain function. These hormones also play a role in keeping your bones strong. When the thyroid is underactive, it produces too little of the hormones thyroxine (T4) and triiodothyronine (T3). What are the causes? This condition may be caused by:  Hashimoto's disease. This is a disease in which the body's disease-fighting system (immune system) attacks the thyroid gland. This is the most common cause.  Viral infections.  Pregnancy.  Certain medicines.  Birth defects.  Past radiation treatments to the head or neck for cancer.  Past treatment with radioactive iodine.  Past exposure to radiation in the environment.  Past surgical removal of part or all of the thyroid.  Problems with a gland in the center of the brain (pituitary gland).  Lack of enough iodine in the diet. What increases the risk? You are more likely to develop this condition if:  You are female.  You have a family history of thyroid conditions.  You use a medicine called lithium.  You take medicines that affect the immune system (immunosuppressants). What are the signs or symptoms? Symptoms of this condition include:  Feeling as though you have no energy (lethargy).  Not being able to tolerate cold.  Weight gain that is not explained by a change in diet or exercise habits.  Lack of appetite.  Dry skin.  Coarse hair.  Menstrual irregularity.  Slowing of thought processes.  Constipation.  Sadness or depression. How is this diagnosed? This condition may be diagnosed based on:  Your symptoms, your medical history, and a physical exam.  Blood  tests. You may also have imaging tests, such as an ultrasound or MRI. How is this treated? This condition is treated with medicine that replaces the thyroid hormones that your body does not make. After you begin treatment, it may take several weeks for symptoms to go away. Follow these instructions at home:  Take over-the-counter and prescription medicines only as told by your health care provider.  If you start taking any new medicines, tell your health care provider.  Keep all follow-up visits as told by your health care provider. This is important. ? As your condition improves, your dosage of thyroid hormone medicine may change. ? You will need to have blood tests regularly so that your health care provider can monitor your condition. Contact a health care provider if:  Your symptoms do not get better with treatment.  You are taking thyroid hormone replacement medicine and you: ? Sweat a lot. ? Have tremors. ? Feel anxious. ? Lose weight rapidly. ? Cannot tolerate heat. ? Have emotional swings. ? Have diarrhea. ? Feel weak. Get help right away if you have:  Chest pain.  An irregular heartbeat.  A rapid heartbeat.  Difficulty breathing. Summary  Hypothyroidism is when the thyroid gland does not make enough of certain hormones (it is underactive).  When the thyroid is underactive, it produces too little of the hormones thyroxine (T4) and triiodothyronine (T3).  The most common cause is Hashimoto's disease, a disease in which the body's disease-fighting system (immune system) attacks the thyroid gland. The condition can also be caused by viral infections, medicine, pregnancy, or   past radiation treatment to the head or neck.  Symptoms may include weight gain, dry skin, constipation, feeling as though you do not have energy, and not being able to tolerate cold.  This condition is treated with medicine to replace the thyroid hormones that your body does not make. This  information is not intended to replace advice given to you by your health care provider. Make sure you discuss any questions you have with your health care provider. Document Revised: 02/28/2020 Document Reviewed: 02/13/2020 Elsevier Patient Education  2021 Elsevier Inc.  

## 2020-09-07 ENCOUNTER — Ambulatory Visit: Payer: Medicare HMO | Admitting: Family Medicine

## 2020-09-08 NOTE — Progress Notes (Signed)
Established patient visit   Patient: Karen Davila   DOB: [birth year]   87 y.o. Female  MRN: [id number] Visit Date: 09/04/2020  Today's healthcare provider: Trey Sailors, PA-C   Chief Complaint  Patient presents with  . Follow-up  . Hyperlipidemia  . Hypertension  . Hypothyroidism   Subjective    HPI   Patient presents with her nephew Casimiro Needle today who provides a majority of the history.   Hypothyroid, follow-up  Lab Results  Component Value Date   TSH 2.35 09/04/2020   TSH 8.92 (H) 05/26/2020   TSH 1.80 03/09/2020   Wt Readings from Last 3 Encounters:  09/04/20 139 lb (63 kg)  08/03/20 150 lb (68 kg)  05/26/20 134 lb 14.4 oz (61.2 kg)    She was last seen for hypothyroid 3 months ago.  Management since that visit includes encouraged compliance with medications. She reports good compliance with treatment. She is not having side effects.   Symptoms: No change in energy level No constipation  No diarrhea No heat / cold intolerance  No nervousness No palpitations  No weight changes    -----------------------------------------------------------------------------------------       Medications: Outpatient Medications Prior to Visit  Medication Sig  . amLODipine (NORVASC) 5 MG tablet Take 1 tablet (5 mg total) by mouth daily.  [name] aspirin EC 81 MG tablet Take 81 mg by mouth daily.  [name] atorvastatin (LIPITOR) 10 MG tablet Take 1 tablet (10 mg total) by mouth daily.  [name] levothyroxine (SYNTHROID) 50 MCG tablet Take 1 tablet (50 mcg total) by mouth daily.  [name] lisinopril (ZESTRIL) 20 MG tablet Take 1 tablet by mouth once daily  . [DISCONTINUED] lisinopril (ZESTRIL) 20 MG tablet Take 1 tablet (20 mg total) by mouth daily.  . [DISCONTINUED] polyethylene glycol (MIRALAX) 17 g packet Take 1/2 a cap to 1 cap dissolved in clear liquid, po, once daily as needed for constipation or hard stool   No facility-administered medications prior to visit.    Review of Systems   All other systems reviewed and are negative.      Objective    BP 118/72   Pulse 84   Temp 98 F (36.7 C)   Resp 14   Wt 139 lb (63 kg)   SpO2 96%   BMI 22.44 kg/m     Physical Exam Constitutional:      Appearance: Normal appearance.  Cardiovascular:     Rate and Rhythm: Normal rate and regular rhythm.     Heart sounds: Normal heart sounds.  Pulmonary:     Effort: Pulmonary effort is normal.     Breath sounds: Normal breath sounds.  Musculoskeletal:     Cervical back: Full passive range of motion without pain.  Skin:    General: Skin is warm and dry.  Neurological:     Mental Status: She is alert and oriented to person, place, and time. Mental status is at baseline.  Psychiatric:        Mood and Affect: Mood normal.        Behavior: Behavior normal.       Results for orders placed or performed in visit on 09/04/20  TSH  Result Value Ref Range   TSH 2.35 0.40 - 4.50 mIU/L    Assessment & Plan    1. Primary hypertension  Stable, pulled for refill.   2. Hyperlipidemia, unspecified hyperlipidemia type  Stable, pulled for refill.   3. Adult hypothyroidism  Improved, continue  current dose.   - TSH   Return in about 6 months (around 03/07/2021) for chronic.        Trey Sailors, PA-C  [facility] [phone] (phone) [phone] (fax)  [facility]

## 2020-09-14 ENCOUNTER — Telehealth: Payer: Self-pay

## 2020-09-14 NOTE — Progress Notes (Signed)
    Chronic Care Management Pharmacy Assistant   Name: Karen Davila  MRN: [id number] DOB: 21-Mar-1918  Reason for Encounter: Medication Review/Medication coordination call.   Recent office visits:  09/04/2020 PCP Office [facility]st  Recent consult visits:  None ID  Hospital visits:  None in previous 6 months  Medications: Outpatient Encounter Medications as of 09/14/2020  Medication Sig  . amLODipine (NORVASC) 5 MG tablet Take 1 tablet (5 mg total) by mouth daily.  [name] aspirin EC 81 MG tablet Take 81 mg by mouth daily.  [name] atorvastatin (LIPITOR) 10 MG tablet Take 1 tablet (10 mg total) by mouth daily.  [name] levothyroxine (SYNTHROID) 50 MCG tablet Take 1 tablet (50 mcg total) by mouth daily.  [name] lisinopril (ZESTRIL) 20 MG tablet Take 1 tablet by mouth once daily  . [DISCONTINUED] ranitidine (ZANTAC) 150 MG tablet TAKE 1 TABLET BY MOUTH AT BEDTIME FOR STOMACH (Patient not taking: Reported on 08/28/2018)   No facility-administered encounter medications on file as of 09/14/2020.   Star Rating Drugs:  Atorvastatin 10 mg last filled on 07/27/2020 for 30 days supply at [facility]. Lisinopril 20 mg last filled on 07/27/2020 for 30 days supply at [facility]  Reviewed chart for medication changes ahead of medication coordination call.   BP Readings from Last 3 Encounters:  09/04/20 118/72  08/03/20 (!) 124/54  05/26/20 116/72    No results found for: HGBA1C   Patient obtains medications through Adherence Packaging  30 Days   Last adherence delivery included:  None ID  Patient declined medication last month: Amlodipine 5 mg- 1 tablet daily (breakfast) Atorvastatin 10 mg- 1 tablet daily (breakfast) Levothyroxine 50 mcg- 1 tablet daily (before breakfast) Lisinopril 20 mg- 1 tablet daily (breakfast) Aspirin 81 mg- 1 tablet daily (breakfast)                         Due to receiving a 30 day supply on 08/21/2020 Miralax 17g- use daily as needed- patient son will call when  needed. Patient is due for next adherence delivery on: 09/21/2020.  Called patient and reviewed medications and coordinated delivery.  This delivery to include: Amlodipine 5 mg- 1 tablet daily (breakfast) Atorvastatin 10 mg- 1 tablet daily (breakfast) Levothyroxine 50 mcg- 1 tablet daily (before breakfast) Lisinopril 20 mg- 1 tablet daily (breakfast) Aspirin 81 mg- 1 tablet daily (breakfast)  Patient declined the following medications  Miralax 17g- use daily as needed- patient son will call when needed.  Patient needs refills for None ID.  Confirmed delivery date of 09/23/2020, advised patient that pharmacy will contact them the morning of delivery.  Blood pressure readings: Patient son states he is going to start checking her blood pressure and writing it down.  [name] Clinical Pharmacist Assistant [phone]

## 2020-09-23 ENCOUNTER — Telehealth: Payer: Self-pay

## 2020-09-23 DIAGNOSIS — F0390 Unspecified dementia without behavioral disturbance: Secondary | ICD-10-CM

## 2020-09-23 DIAGNOSIS — Z7189 Other specified counseling: Secondary | ICD-10-CM

## 2020-09-23 NOTE — Telephone Encounter (Signed)
I called and spoke to family to see what care home they want for Karen Davila. They have not decided. I informed them to give name of facility [facility] for a FL2 form and any other paperwork that need to be filled out and bring by office. We will fill out and they can return to care home.

## 2020-09-23 NOTE — Telephone Encounter (Signed)
Copied from CRM [id number]. Topic: Referral - Request for Referral >> Sep 23, 2020  2:28 PM Pawlus, Maxine Glenn A wrote: Pts godson called in stating the pts health has declined, she is not really eating and he feels like she is not her normal self anymore. Caller requested a referral to an assisted living facility, or any other advice that would be helpful from Leisa/her nurse, Please advise. >> Sep 23, 2020  2:32 PM Pawlus, Gifford Shave wrote: Jethro Bolus also stated he believes it may be alzheimers.

## 2020-09-24 NOTE — Telephone Encounter (Signed)
Family dropped off FL2 form today, They will also listen to Hospice to see what option is best.  Family stated they think  she may have Alzheimers. Her memory is not the same

## 2020-09-24 NOTE — Telephone Encounter (Signed)
Patient grand son and care giver Micheal [phone] left forms

## 2020-09-24 NOTE — Addendum Note (Signed)
Addended by: [name] on: 09/24/2020 09:19 AM   Modules accepted: Orders

## 2020-09-24 NOTE — Addendum Note (Signed)
Addended by: [name] on: 09/24/2020 04:34 PM   Modules accepted: Orders

## 2020-09-24 NOTE — Telephone Encounter (Signed)
Can you please document the additional info- who you have been talking with and preferred contact info please - I have to put all that in the referrals

## 2020-09-25 ENCOUNTER — Telehealth: Payer: Self-pay

## 2020-09-25 NOTE — Chronic Care Management (AMB) (Signed)
  Chronic Care Management   Note  09/25/2020 Name: Karen Davila MRN: [id number] DOB: 06/24/1917  Karen Davila is a 87 y.o. year old female who is a primary care patient of [name], [place]. Karen Davila is currently enrolled in care management services. An additional referral for Licensed Clinical SW was placed.   Follow up plan: Telephone appointment with care management team member scheduled for:09/28/2020  Karen Davila, RMA Care Guide, Embedded Care Coordination [facility]  [place], [place] [address] Direct Dial: [phone] [email or web address]

## 2020-09-28 ENCOUNTER — Ambulatory Visit (INDEPENDENT_AMBULATORY_CARE_PROVIDER_SITE_OTHER): Payer: Medicare HMO | Admitting: *Deleted

## 2020-09-28 DIAGNOSIS — F0391 Unspecified dementia with behavioral disturbance: Secondary | ICD-10-CM | POA: Diagnosis not present

## 2020-09-28 DIAGNOSIS — I10 Essential (primary) hypertension: Secondary | ICD-10-CM | POA: Diagnosis not present

## 2020-09-28 NOTE — Patient Instructions (Signed)
Visit Information  Goals Addressed            This Visit's Progress   . Find Help in My Community       Timeframe:  Long-Range Goal Priority:  High Start Date:     09/28/20                        Expected End Date:    02/28/21                   Follow Up Date 10/19/2020    - follow-up on any referrals for help I am given - think ahead to make sure my need does not become an emergency  -Follow up with referral for Hospice and [facility]   Why is this important?    Knowing how and where to find help for yourself or family in your neighborhood and community is an important skill.   You will want to take some steps to learn how.    Notes:        The patient verbalized understanding of instructions, educational materials, and care plan provided today and declined offer to receive copy of patient instructions, educational materials, and care plan.   Telephone follow up appointment with care management team member scheduled for:10/19/20  Verna Czech, LCSW Clinical Social Worker  [facility] Care Management [phone]

## 2020-09-28 NOTE — Chronic Care Management (AMB) (Signed)
Chronic Care Management    Clinical Social Work Note  09/28/2020 Name: Karen Davila MRN: [id number] DOB: August 16, 1917  Karen Davila is a 87 y.o. year old female who is a primary care patient of [name], [place]. The CCM team was consulted to assist the patient with chronic disease management and/or care coordination needs related to: Level of Care Concerns\.   Engaged with patient by telephone for initial visit in response to provider referral for social work chronic care management and care coordination services.   Consent to Services:  The patient was given information about Chronic Care Management services, agreed to services, and gave verbal consent prior to initiation of services.  Please see initial visit note for detailed documentation.   Patient agreed to services and consent obtained.   Assessment: Review of patient past medical history, allergies, medications, and health status, including review of relevant consultants reports was performed today as part of a comprehensive evaluation and provision of chronic care management and care coordination services.     SDOH (Social Determinants of Health) assessments and interventions performed:    Advanced Directives Status: Not addressed in this encounter.  CCM Care Plan  No Known Allergies  Outpatient Encounter Medications as of 09/28/2020  Medication Sig  . amLODipine (NORVASC) 5 MG tablet Take 1 tablet (5 mg total) by mouth daily.  [name] aspirin EC 81 MG tablet Take 81 mg by mouth daily.  [name] atorvastatin (LIPITOR) 10 MG tablet Take 1 tablet (10 mg total) by mouth daily.  [name] levothyroxine (SYNTHROID) 50 MCG tablet Take 1 tablet (50 mcg total) by mouth daily.  [name] lisinopril (ZESTRIL) 20 MG tablet Take 1 tablet by mouth once daily  . [DISCONTINUED] ranitidine (ZANTAC) 150 MG tablet TAKE 1 TABLET BY MOUTH AT BEDTIME FOR STOMACH (Patient not taking: Reported on 08/28/2018)   No facility-administered encounter medications on file as of  09/28/2020.    Patient Active Problem List   Diagnosis Date Noted  . Anemia 06/15/2018  . Atherosclerosis of aorta (HCC) 05/16/2018  . Venous insufficiency 08/08/2017  . Lichen simplex chronicus 08/08/2017  . Swelling of limb 05/30/2017  . Left arm pain 08/30/2016  . Bilateral leg cramps 07/06/2016  . Chronic kidney disease, stage 3 (HCC) 07/07/2015  . Cardiac murmur, previously undiagnosed 04/06/2015  . Dementia (HCC) 04/06/2015  . Difficulty hearing 04/06/2015  . OP (osteoporosis) 04/06/2015  . Hypertension 12/23/2014  . GERD (gastroesophageal reflux disease) 12/23/2014  . Adult hypothyroidism 12/23/2014  . Hyperlipidemia 12/23/2014  . Iron deficiency anemia 12/23/2014    Conditions to be addressed/monitored: Dementia; Memory Deficits  Care Plan : Dementia (Adult)  Updates made by Wenda Overland, LCSW since 09/28/2020 12:00 AM    Problem: Behavioral Symptoms     Problem: Long-Term Care Planning     Long-Range Goal: Effective Long-Term Care Planning   Start Date: 09/28/2020  This Visit's Progress: On track  Priority: High  Note:   Current Barriers:   . Patient's health has declined and longer able to meet ADL's at home . Acknowledges deficits with education and support in order to meet this need . Unable to perform ADLs independently . Unable to perform IADLs independently . Memory Deficits Clinical Goal(s): Over the next 30 to 45 days patient will be placed in a facility, patient and family will work with LCSW, to coordinate needs for assisted living Placement Interventions: . Collaboration with [name], PA-C regarding development and update of comprehensive plan of care as evidenced by provider  attestation and co-signature . Inter-disciplinary care team collaboration (see longitudinal plan of care) . Assessed needs and discussed level of care and facility placement process . Patient's son has confirmed that he has obtained the [facility] and has forwarded this form to  [facility] where patient's sister already resides . Confirmed that patient has also been referred for Hospice services-assessment completed by Hospice nurse today. . Confirmed that patient has Medicaid and [facility] is reviewing Fl2 for placement  Patient Goals/Self-Care Activities  . Over the next 30 days, patient and/or designated caregiver will:   - follow up with Hospice for ongoing services as well as [facility] for long term placement - follow-up on any referrals for help I am given - think ahead to make sure my need does not become an emergency  -Follow up with referral for Hospice and [facility]   Follow Up Plan: Telephone follow up appointment with care management team member scheduled for: 10/19/20                   Task: Facilitate Activities of Long-Term Care Planning        Follow Up Plan: SW will follow up with patient by phone over the next 30 business days      Garielle Mroz, [place] Clinical Social Worker  [facility] Care Management [phone]

## 2020-10-13 ENCOUNTER — Telehealth: Payer: Self-pay

## 2020-10-13 NOTE — Progress Notes (Signed)
    Chronic Care Management Pharmacy Assistant   Name: Karen Davila  MRN: [id number] DOB: 1917/11/14  Reason for Encounter: Medication ReviewMedication Coordination Call.   Recent office visits:  09/28/2020 Chrystal Land LCSW (CCM)  Recent consult visits:  No recent consult visit  Hospital visits:  None in previous 6 months  Medications: Outpatient Encounter Medications as of 10/13/2020  Medication Sig  . amLODipine (NORVASC) 5 MG tablet Take 1 tablet (5 mg total) by mouth daily.  [name] aspirin EC 81 MG tablet Take 81 mg by mouth daily.  [name] atorvastatin (LIPITOR) 10 MG tablet Take 1 tablet (10 mg total) by mouth daily.  [name] levothyroxine (SYNTHROID) 50 MCG tablet Take 1 tablet (50 mcg total) by mouth daily.  [name] lisinopril (ZESTRIL) 20 MG tablet Take 1 tablet by mouth once daily  . [DISCONTINUED] ranitidine (ZANTAC) 150 MG tablet TAKE 1 TABLET BY MOUTH AT BEDTIME FOR STOMACH (Patient not taking: Reported on 08/28/2018)   No facility-administered encounter medications on file as of 10/13/2020.   Star Rating Drugs: Atorvastatin 10 mg last filled on 08/21/2020 for 30 Day supply at [facility]. Lisinopril 20 mg last filled on 08/21/2020 for 30 day supply at [facility].  Reviewed chart for medication changes ahead of medication coordination call.  BP Readings from Last 3 Encounters:  09/04/20 118/72  08/03/20 (!) 124/54  05/26/20 116/72    No results found for: HGBA1C   Patient obtains medications through Adherence Packaging  30 Days   Last adherence delivery included: Amlodipine 5 mg- 1 tablet daily (breakfast) Atorvastatin 10 mg- 1 tablet daily (breakfast) Levothyroxine 50 mcg- 1 tablet daily (before breakfast) Lisinopril 20 mg- 1 tablet daily (breakfast) Aspirin 81 mg- 1 tablet daily (breakfast)  Patient declined medication last month None ID Patient is due for next adherence delivery on: 10/23/2020. Called patient and reviewed medications and coordinated  delivery.  This delivery to include: Amlodipine 5 mg- 1 tablet daily (breakfast) Atorvastatin 10 mg- 1 tablet daily (breakfast) Levothyroxine 50 mcg- 1 tablet daily (before breakfast) Lisinopril 20 mg- 1 tablet daily (breakfast) Aspirin 81 mg- 1 tablet daily (breakfast)  Patient declined the following medications  None ID  Patient needs refills for None ID .  Confirmed delivery date of 10/22/2020, advised patient that pharmacy will contact them the morning of delivery.  [name] Clinical Pharmacist Assistant [phone]

## 2020-10-19 ENCOUNTER — Ambulatory Visit (INDEPENDENT_AMBULATORY_CARE_PROVIDER_SITE_OTHER): Payer: Medicare Other | Admitting: *Deleted

## 2020-10-19 DIAGNOSIS — F0391 Unspecified dementia with behavioral disturbance: Secondary | ICD-10-CM | POA: Diagnosis not present

## 2020-10-19 DIAGNOSIS — I10 Essential (primary) hypertension: Secondary | ICD-10-CM

## 2020-10-19 NOTE — Chronic Care Management (AMB) (Signed)
Chronic Care Management    Clinical Social Work Note  10/19/2020 Name: Karen Davila MRN: [id number] DOB: [birth year]  Karen Davila is a 87 y.o. year old female who is a primary care patient of Karen Davila, [place]. The CCM team was consulted to assist the patient with chronic disease management and/or care coordination needs related to: Level of Care Concerns  Collaboration with patient's son for follow up visit in response to provider referral for social work chronic care management and care coordination services.   Consent to Services:  The patient was given information about Chronic Care Management services, agreed to services, and gave verbal consent prior to initiation of services.  Please see initial visit note for detailed documentation.   Patient agreed to services and consent obtained.   Assessment: Review of patient past medical history, allergies, medications, and health status, including review of relevant consultants reports was performed today as part of a comprehensive evaluation and provision of chronic care management and care coordination services.     SDOH (Social Determinants of Health) assessments and interventions performed:    Advanced Directives Status: Not addressed in this encounter.  CCM Care Plan  No Known Allergies  Outpatient Encounter Medications as of 10/19/2020  Medication Sig  . amLODipine (NORVASC) 5 MG tablet Take 1 tablet (5 mg total) by mouth daily.  [name] aspirin EC 81 MG tablet Take 81 mg by mouth daily.  [name] atorvastatin (LIPITOR) 10 MG tablet Take 1 tablet (10 mg total) by mouth daily.  [name] levothyroxine (SYNTHROID) 50 MCG tablet Take 1 tablet (50 mcg total) by mouth daily.  [name] lisinopril (ZESTRIL) 20 MG tablet Take 1 tablet by mouth once daily  . [DISCONTINUED] ranitidine (ZANTAC) 150 MG tablet TAKE 1 TABLET BY MOUTH AT BEDTIME FOR STOMACH (Patient not taking: Reported on 08/28/2018)   No facility-administered encounter medications on file as of  10/19/2020.    Patient Active Problem List   Diagnosis Date Noted  . Anemia 06/15/2018  . Atherosclerosis of aorta (HCC) 05/16/2018  . Venous insufficiency 08/08/2017  . Lichen simplex chronicus 08/08/2017  . Swelling of limb 05/30/2017  . Left arm pain 08/30/2016  . Bilateral leg cramps 07/06/2016  . Chronic kidney disease, stage 3 (HCC) 07/07/2015  . Cardiac murmur, previously undiagnosed 04/06/2015  . Dementia (HCC) 04/06/2015  . Difficulty hearing 04/06/2015  . OP (osteoporosis) 04/06/2015  . Hypertension 12/23/2014  . GERD (gastroesophageal reflux disease) 12/23/2014  . Adult hypothyroidism 12/23/2014  . Hyperlipidemia 12/23/2014  . Iron deficiency anemia 12/23/2014    Conditions to be addressed/monitored: Dementia; Level of care concerns  Care Plan : Dementia (Adult)  Updates made by Karen Overland, LCSW since 10/19/2020 12:00 AM    Problem: Long-Term Care Planning     Long-Range Goal: Effective Long-Term Care Planning   Start Date: 09/28/2020  Expected End Date: 10/19/2020  This Visit's Progress: On track  Recent Progress: On track  Priority: High  Note:   Current Barriers:   . Patient's health has declined and longer able to meet ADL's at home . Acknowledges deficits with education and support in order to meet this need . Unable to perform ADLs independently . Unable to perform IADLs independently . Memory Deficits Clinical Goal(s): Over the next 30 to 45 days patient will be placed in a facility, patient and family will work with LCSW, to coordinate needs for assisted living Placement Interventions: . Collaboration with Karen Berry, PA-C regarding development and update of comprehensive plan of care  as evidenced by provider attestation and co-signature . Inter-disciplinary care team collaboration (see longitudinal plan of care) . Follow up phone call to patient's son regarding patient's level of care needs . Patient's son has confirmed that patient Confirmed that  patient has been referred for Hospice services and is now active with them-plan for out of home placement declined at this time. . Confirmed with son that he is agreeable to this plan as patient's goal is to remain in her own home as long as possible . Emotional support provided, CSW assessed for any additional in community resource needs. . Patient's son verbalized having no additional community resource needs at this time  Patient Goals/Self-Care Activities  . Over the next 30 days, patient and/or designated caregiver will:   - follow up with Hospice for ongoing services as well as [facility] for long term placement - follow-up on any referrals for help I am given - think ahead to make sure my need does not become an emergency  -Follow up with referral for Hospice and [facility]                        Follow Up Plan: Patient's son to call this social worker with any additonal commummity resource needs      Karen Czech, LCSW Clinical Social Worker  [facility] Care Management [phone]

## 2020-10-19 NOTE — Patient Instructions (Signed)
Visit Information  Goals Addressed            This Visit's Progress   . Find Help in My Community       Timeframe:  Long-Range Goal Priority:  High Start Date:     09/28/20                        Expected End Date:    10/19/20                  Follow Up Date 10/19/2020    - follow-up on any referrals for help I am given - think ahead to make sure my need does not become an emergency  -Follow up with referral for Hospice and [facility]   Why is this important?    Knowing how and where to find help for yourself or family in your neighborhood and community is an important skill.   You will want to take some steps to learn how.    Notes:        The patient verbalized understanding of instructions, educational materials, and care plan provided today and declined offer to receive copy of patient instructions, educational materials, and care plan.   No further follow up required: patient's son to call this social with any additional community resource needs  Verna Czech, LCSW Clinical Social Worker  [facility] Care Management [phone]

## 2020-11-07 ENCOUNTER — Encounter: Payer: Self-pay | Admitting: Emergency Medicine

## 2020-11-07 ENCOUNTER — Other Ambulatory Visit: Payer: Self-pay

## 2020-11-07 ENCOUNTER — Emergency Department
Admission: EM | Admit: 2020-11-07 | Discharge: 2020-11-07 | Disposition: A | Discharge status: Home / Self Care | Attending: Emergency Medicine | Admitting: Emergency Medicine

## 2020-11-07 DIAGNOSIS — E039 Hypothyroidism, unspecified: Secondary | ICD-10-CM | POA: Diagnosis not present

## 2020-11-07 DIAGNOSIS — F0390 Unspecified dementia without behavioral disturbance: Secondary | ICD-10-CM | POA: Insufficient documentation

## 2020-11-07 DIAGNOSIS — R443 Hallucinations, unspecified: Secondary | ICD-10-CM | POA: Diagnosis not present

## 2020-11-07 DIAGNOSIS — Z79899 Other long term (current) drug therapy: Secondary | ICD-10-CM | POA: Diagnosis not present

## 2020-11-07 DIAGNOSIS — N183 Chronic kidney disease, stage 3 unspecified: Secondary | ICD-10-CM | POA: Diagnosis not present

## 2020-11-07 DIAGNOSIS — I959 Hypotension, unspecified: Secondary | ICD-10-CM | POA: Diagnosis not present

## 2020-11-07 DIAGNOSIS — Z7982 Long term (current) use of aspirin: Secondary | ICD-10-CM | POA: Insufficient documentation

## 2020-11-07 DIAGNOSIS — R1111 Vomiting without nausea: Secondary | ICD-10-CM | POA: Diagnosis not present

## 2020-11-07 DIAGNOSIS — I129 Hypertensive chronic kidney disease with stage 1 through stage 4 chronic kidney disease, or unspecified chronic kidney disease: Secondary | ICD-10-CM | POA: Insufficient documentation

## 2020-11-07 LAB — CBC WITH DIFFERENTIAL/PLATELET
Abs Immature Granulocytes: 0.08 10*3/uL — ABNORMAL HIGH (ref 0.00–0.07)
Basophils Absolute: 0 10*3/uL (ref 0.0–0.1)
Basophils Relative: 0 %
Eosinophils Absolute: 0.1 10*3/uL (ref 0.0–0.5)
Eosinophils Relative: 1 %
HCT: 29.3 % — ABNORMAL LOW (ref 36.0–46.0)
Hemoglobin: 9.2 g/dL — ABNORMAL LOW (ref 12.0–15.0)
Immature Granulocytes: 1 %
Lymphocytes Relative: 22 %
Lymphs Abs: 1.7 10*3/uL (ref 0.7–4.0)
MCH: 24.3 pg — ABNORMAL LOW (ref 26.0–34.0)
MCHC: 31.4 g/dL (ref 30.0–36.0)
MCV: 77.5 fL — ABNORMAL LOW (ref 80.0–100.0)
Monocytes Absolute: 0.3 10*3/uL (ref 0.1–1.0)
Monocytes Relative: 4 %
Neutro Abs: 5.4 10*3/uL (ref 1.7–7.7)
Neutrophils Relative %: 72 %
Platelets: 229 10*3/uL (ref 150–400)
RBC: 3.78 MIL/uL — ABNORMAL LOW (ref 3.87–5.11)
RDW: 17.1 % — ABNORMAL HIGH (ref 11.5–15.5)
WBC: 7.5 10*3/uL (ref 4.0–10.5)
nRBC: 0.3 % — ABNORMAL HIGH (ref 0.0–0.2)

## 2020-11-07 LAB — BASIC METABOLIC PANEL
Anion gap: 16 — ABNORMAL HIGH (ref 5–15)
BUN: 25 mg/dL — ABNORMAL HIGH (ref 8–23)
CO2: 19 mmol/L — ABNORMAL LOW (ref 22–32)
Calcium: 9.5 mg/dL (ref 8.9–10.3)
Chloride: 101 mmol/L (ref 98–111)
Creatinine, Ser: 1.84 mg/dL — ABNORMAL HIGH (ref 0.44–1.00)
GFR, Estimated: 24 mL/min — ABNORMAL LOW (ref >60)
Glucose, Bld: 134 mg/dL — ABNORMAL HIGH (ref 70–99)
Potassium: 4 mmol/L (ref 3.5–5.1)
Sodium: 136 mmol/L (ref 135–145)

## 2020-11-07 MED ORDER — SODIUM CHLORIDE 0.9 % IV BOLUS
1000.0000 mL | Freq: Once | INTRAVENOUS | Status: AC
  Administered 2020-11-07: 1000 mL via INTRAVENOUS

## 2020-11-07 NOTE — ED Provider Notes (Signed)
[facility] Emergency Department Provider Note   ____________________________________________   Event Date/Time   First MD Initiated Contact with Patient 11/07/20 1220     (approximate)  I have reviewed the triage vital signs and the nursing notes.   HISTORY  Chief Complaint Hallucinations    HPI Karen Davila is a 87 y.o. female with past medical history of hypertension, hyperlipidemia, CKD, and dementia who presents to the ED for confusion.  History is limited due to patient's baseline dementia.  She currently lives at an independent secure community and was found by staff sitting outside of her home.  She stated that she could not go back in because someone was in there, but no one was present when staff went to check.  Patient currently denies any complaints, states she feels well.  Her son is at bedside and states patient is at her baseline mental status, he does not currently have any concerns regarding her health and states it is not unusual for her to have episodes like this.        Past Medical History:  Diagnosis Date  . Arthritis   . Chronic kidney disease   . GERD (gastroesophageal reflux disease)   . Hyperlipidemia   . Hypertension     Patient Active Problem List   Diagnosis Date Noted  . Anemia 06/15/2018  . Atherosclerosis of aorta (HCC) 05/16/2018  . Venous insufficiency 08/08/2017  . Lichen simplex chronicus 08/08/2017  . Swelling of limb 05/30/2017  . Left arm pain 08/30/2016  . Bilateral leg cramps 07/06/2016  . Chronic kidney disease, stage 3 (HCC) 07/07/2015  . Cardiac murmur, previously undiagnosed 04/06/2015  . Dementia (HCC) 04/06/2015  . Difficulty hearing 04/06/2015  . OP (osteoporosis) 04/06/2015  . Hypertension 12/23/2014  . GERD (gastroesophageal reflux disease) 12/23/2014  . Adult hypothyroidism 12/23/2014  . Hyperlipidemia 12/23/2014  . Iron deficiency anemia 12/23/2014    Past Surgical History:   Procedure Laterality Date  . EYE SURGERY Right    Cataract    Prior to Admission medications   Medication Sig Start Date End Date Taking? Authorizing Provider  amLODipine (NORVASC) 5 MG tablet Take 1 tablet (5 mg total) by mouth daily. 07/30/20   [name], PA-C  aspirin EC 81 MG tablet Take 81 mg by mouth daily.    [provider]  atorvastatin (LIPITOR) 10 MG tablet Take 1 tablet (10 mg total) by mouth daily. 07/30/20   [name], PA-C  levothyroxine (SYNTHROID) 50 MCG tablet Take 1 tablet (50 mcg total) by mouth daily. 07/30/20   [name], PA-C  lisinopril (ZESTRIL) 20 MG tablet Take 1 tablet by mouth once daily 07/27/20   [name], PA-C  ranitidine (ZANTAC) 150 MG tablet TAKE 1 TABLET BY MOUTH AT BEDTIME FOR STOMACH Patient not taking: Reported on 08/28/2018 02/22/18 11/30/18  Kerman Passey, MD    Allergies Patient has no known allergies.  Family History  Problem Relation Age of Onset  . Stroke Mother   . Healthy Father     Social History Social History   Tobacco Use  . Smoking status: Never Smoker  . Smokeless tobacco: Never Used  . Tobacco comment: smoking cessation materials not required  Vaping Use  . Vaping Use: Never used  Substance Use Topics  . Alcohol use: No    Alcohol/week: 0.0 standard drinks  . Drug use: No    Review of Systems  Constitutional: No fever/chills Eyes: No visual changes. ENT: No sore  throat. Cardiovascular: Denies chest pain. Respiratory: Denies shortness of breath. Gastrointestinal: No abdominal pain.  No nausea, no vomiting.  No diarrhea.  No constipation. Genitourinary: Negative for dysuria. Musculoskeletal: Negative for back pain. Skin: Negative for rash. Neurological: Negative for headaches, focal weakness or numbness.  Positive for hallucinations.  ____________________________________________   PHYSICAL EXAM:  VITAL SIGNS: ED Triage Vitals  Enc Vitals Group     BP      Pulse      Resp      Temp       Temp src      SpO2      Weight      Height      Head Circumference      Peak Flow      Pain Score      Pain Loc      Pain Edu?      Excl. in GC?     Constitutional: Alert and oriented to person, but not place or time. Eyes: Conjunctivae are normal. Head: Atraumatic. Nose: No congestion/rhinnorhea. Mouth/Throat: Mucous membranes are moist. Neck: Normal ROM Cardiovascular: Normal rate, regular rhythm. Grossly normal heart sounds. Respiratory: Normal respiratory effort.  No retractions. Lungs CTAB. Gastrointestinal: Soft and nontender. No distention. Genitourinary: deferred Musculoskeletal: No lower extremity tenderness nor edema. Neurologic:  Normal speech and language. No gross focal neurologic deficits are appreciated. Skin:  Skin is warm, dry and intact. No rash noted. Psychiatric: Mood and affect are normal. Speech and behavior are normal.  ____________________________________________   LABS (all labs ordered are listed, but only abnormal results are displayed)  Labs Reviewed  CBC WITH DIFFERENTIAL/PLATELET - Abnormal; Notable for the following components:      Result Value   RBC 3.78 (*)    Hemoglobin 9.2 (*)    HCT 29.3 (*)    MCV 77.5 (*)    MCH 24.3 (*)    RDW 17.1 (*)    nRBC 0.3 (*)    Abs Immature Granulocytes 0.08 (*)    All other components within normal limits  BASIC METABOLIC PANEL - Abnormal; Notable for the following components:   CO2 19 (*)    Glucose, Bld 134 (*)    BUN 25 (*)    Creatinine, Ser 1.84 (*)    GFR, Estimated 24 (*)    Anion gap 16 (*)    All other components within normal limits   ____________________________________________  EKG  ED ECG REPORT I, Chesley Noon, the attending physician, personally viewed and interpreted this ECG.   Date: 11/07/2020  EKG Time: 12:36  Rate: 88  Rhythm: normal sinus rhythm  Axis: Normal  Intervals:none  ST&T Change: None   PROCEDURES  Procedure(s) performed (including Critical  Care):  Procedures   ____________________________________________   INITIAL IMPRESSION / ASSESSMENT AND PLAN / ED COURSE       87 year old female with past medical history of hypertension, hyperlipidemia, CKD, dementia who presents to the ED due to concern for hallucinations and stating someone was present in her home when no one was there.  Here in the ED, patient states that she feels well and son states she is at her baseline mental status.  She has no focal neurologic deficits and vital signs are reassuring.  EKG shows no evidence of arrhythmia or ischemia, labs remarkable for mild AKI.  We will hydrate with IV fluids but patient is currently following with hospice, family not interested in admission.  She is appropriate for discharge home with PCP follow-up  for recheck of her kidney function, son counseled to have her return to the ED for new worsening symptoms.      ____________________________________________   FINAL CLINICAL IMPRESSION(S) / ED DIAGNOSES  Final diagnoses:  Dementia without behavioral disturbance, unspecified dementia type (HCC)  Hallucinations     ED Discharge Orders    None       Note:  This document was prepared using Dragon voice recognition software and may include unintentional dictation errors.   Chesley Noon, MD 11/07/20 1326

## 2020-11-07 NOTE — ED Triage Notes (Signed)
Pt via EMS from [facility], an independent secure community. Per neighbors, pt was seeing sitting outside when EMS got to her pt stated that she did not want to go inside because someone was in her house. Pt also vomited on the way here. Pt is a Hospice pt. Pt is alert but disoriented to situation, time, and place. God-son at bedside and per him pt is not acting any different than normal.

## 2020-11-12 ENCOUNTER — Telehealth: Payer: Self-pay

## 2020-11-12 NOTE — Progress Notes (Signed)
    Chronic Care Management Pharmacy Assistant   Name: MARKETTA VALADEZ  MRN: [id number] DOB: [birth year]  Reason for Encounter: Medication Review /Medication Coordination Call.    Recent office visits:  10/19/2020 Chrystal Land LCSW (CCM)   Recent consult visits:  No recent Consult Visit   Hospital visits:  Medication Reconciliation was completed by comparing discharge summary, patient's EMR and Pharmacy list, and upon discussion with patient.  Admitted to the hospital on 11/07/2020 due to Hallucinations. Discharge date was 11/07/2020. Discharged from [facility] [place].    New?Medications Started at [facility] Discharge:?? -None  Medication Changes at Hospital Discharge: -None  Medications Discontinued at Hospital Discharge: -None  Medications that remain the same after Hospital Discharge:??  -All other medications will remain the same.    Medications: Outpatient Encounter Medications as of 11/12/2020  Medication Sig  . amLODipine (NORVASC) 5 MG tablet Take 1 tablet (5 mg total) by mouth daily.  [name] aspirin EC 81 MG tablet Take 81 mg by mouth daily.  [name] atorvastatin (LIPITOR) 10 MG tablet Take 1 tablet (10 mg total) by mouth daily.  [name] levothyroxine (SYNTHROID) 50 MCG tablet Take 1 tablet (50 mcg total) by mouth daily.  [name] lisinopril (ZESTRIL) 20 MG tablet Take 1 tablet by mouth once daily  . [DISCONTINUED] ranitidine (ZANTAC) 150 MG tablet TAKE 1 TABLET BY MOUTH AT BEDTIME FOR STOMACH (Patient not taking: Reported on 08/28/2018)   No facility-administered encounter medications on file as of 11/12/2020.   Star Rating Drugs: Atorvastatin 10 mg last filled on 10/16/2020 [facility]. Lisinopril 20 mg  last filled on 10/16/2020 for [facility] day supply at [facility].  Reviewed chart for medication changes ahead of medication coordination call.   BP Readings from Last 3 Encounters:  11/07/20 (!) 98/40  09/04/20 118/72  08/03/20 (!) 124/54    No  results found for: HGBA1C   Patient obtains medications through Adherence Packaging  30 Days   Last adherence delivery included:   Amlodipine 5 mg- 1 tablet daily (breakfast)  Atorvastatin 10 mg- 1 tablet daily (breakfast)  Levothyroxine 50 mcg- 1 tablet daily (before breakfast)  Lisinopril 20 mg- 1 tablet daily (breakfast)  Aspirin 81 mg- 1 tablet daily (breakfast)  Patient declined medications last month: None ID  Patient is due for next adherence delivery on: 11/20/2020. Called patient and reviewed medications and coordinated delivery.  I have attempted without success to contact this patient by phone to do her medication coordination call. I left a Voice message for patient to return my call.  [name] Clinical Pharmacist Assistant [phone]  LVM 06/03

## 2020-12-10 ENCOUNTER — Telehealth: Payer: Self-pay

## 2020-12-10 NOTE — Progress Notes (Signed)
    Chronic Care Management Pharmacy Assistant   Name: Karen Davila  MRN: [id number] DOB: Apr 08, 1918   Reason for Encounter: Medication Review/Adherence Review   Recent office visits:  No recent Office Visit  Recent consult visits:  No recent Consult Visit  Hospital visits:  None in previous 6 months  Medications: Outpatient Encounter Medications as of 12/10/2020  Medication Sig   amLODipine (NORVASC) 5 MG tablet Take 1 tablet (5 mg total) by mouth daily.   aspirin EC 81 MG tablet Take 81 mg by mouth daily.   atorvastatin (LIPITOR) 10 MG tablet Take 1 tablet (10 mg total) by mouth daily.   levothyroxine (SYNTHROID) 50 MCG tablet Take 1 tablet (50 mcg total) by mouth daily.   lisinopril (ZESTRIL) 20 MG tablet Take 1 tablet by mouth once daily   [DISCONTINUED] ranitidine (ZANTAC) 150 MG tablet TAKE 1 TABLET BY MOUTH AT BEDTIME FOR STOMACH (Patient not taking: Reported on 08/28/2018)   No facility-administered encounter medications on file as of 12/10/2020.      Star Rating Drugs: Atorvastatin 10 mg last filled on 11/17/2020 [facility]. Lisinopril 20 mg  last filled on 11/17/2020 for [facility] day [facility].   Care Gaps: Per Chart, patient is due for the following Care Gaps, COVID 19, Shingrix, annual wellness visit. I will recommend annual wellness visit next month when I speak with the patient.  [name] Clinical Pharmacist Assistant [phone]

## 2020-12-11 ENCOUNTER — Telehealth: Payer: Self-pay

## 2020-12-11 NOTE — Progress Notes (Signed)
    Chronic Care Management Pharmacy Assistant   Name: ORRIE LASCANO  MRN: [id number] DOB: Feb 10, 1918  Reason for Encounter: Medication Review/Medication Coordination Call.   Recent office visits:  No recent Office Visit  Recent consult visits:  No recent Consult Visit  Hospital visits:  None in previous 6 months  Medications: Outpatient Encounter Medications as of 12/11/2020  Medication Sig   amLODipine (NORVASC) 5 MG tablet Take 1 tablet (5 mg total) by mouth daily.   aspirin EC 81 MG tablet Take 81 mg by mouth daily.   atorvastatin (LIPITOR) 10 MG tablet Take 1 tablet (10 mg total) by mouth daily.   levothyroxine (SYNTHROID) 50 MCG tablet Take 1 tablet (50 mcg total) by mouth daily.   lisinopril (ZESTRIL) 20 MG tablet Take 1 tablet by mouth once daily   [DISCONTINUED] ranitidine (ZANTAC) 150 MG tablet TAKE 1 TABLET BY MOUTH AT BEDTIME FOR STOMACH (Patient not taking: Reported on 08/28/2018)   No facility-administered encounter medications on file as of 12/11/2020.    Care Gaps: Annual Wellness Visit Star Rating Drugs: Atorvastatin 10 mg last filled on 10/16/2020 [facility] last filled was on 11/17/2020 for 30 day supply. Lisinopril 20 mg  last filled on 10/16/2020 [facility].Per [facility] last filled was on 11/17/2020 for 30 day supply.  Reviewed chart for medication changes ahead of medication coordination call.  BP Readings from Last 3 Encounters:  11/07/20 (!) 98/40  09/04/20 118/72  08/03/20 (!) 124/54    No results found for: HGBA1C   Patient obtains medications through Adherence Packaging  30 Days   Last adherence delivery included:  Amlodipine 5 mg- 1 tablet daily (breakfast) Atorvastatin 10 mg- 1 tablet daily (breakfast) Levothyroxine 50 mcg- 1 tablet daily (before breakfast) Lisinopril 20 mg- 1 tablet daily (breakfast) Aspirin 81 mg- 1 tablet daily (breakfast)    Patient  declined medication last month: None ID  Patient is due for next adherence delivery on: 12/18/2020. Called patient and reviewed medications and coordinated delivery.  This delivery to include: Amlodipine 5 mg- 1 tablet daily (breakfast) Atorvastatin 10 mg- 1 tablet daily (breakfast) Levothyroxine 50 mcg- 1 tablet daily (before breakfast) Lisinopril 20 mg- 1 tablet daily (breakfast) Aspirin 81 mg- 1 tablet daily (breakfast)  Patient declined the following medications: None ID   Patient needs refills for None ID.  Confirmed delivery date of 12/18/2020, advised patient that pharmacy will contact them the morning of delivery.  [name] Clinical Pharmacist Assistant [phone]

## 2021-01-11 ENCOUNTER — Telehealth: Payer: Self-pay

## 2021-01-11 NOTE — Progress Notes (Signed)
    Chronic Care Management Pharmacy Assistant   Name: Karen Davila  MRN: [id number] DOB: June 24, 1917  Please disregard. Note opened in error. Patient is currently on Hospice services and no longer eligible for CCM services.  Reason for Encounter: Medication Review/Medication Coordination Call + Schedule appointment with CPP

## 2021-02-11 DEATH — deceased

## 2021-03-28 IMAGING — CT CT RENAL STONE PROTOCOL
2 of 4 series · 14 of 46 positions shown, 16 images · non-contrast
Comparison: Lumbar spine radiographs, 09/06/2019. Prior CT abdomen
pelvis, 09/16/2019.

CLINICAL DATA: [facility]. Pt c/o L
side pain starting 8488 this morning. Upon arrival pt also c/o L arm
pain. Pt denies any recent falls. Pt also denies abdominal pain at
this time. Per EMS, pt was hypertensive 185/55 but all other VSS.

EXAM:
CT ABDOMEN AND PELVIS WITHOUT CONTRAST
CT LUMBAR SPINE
TECHNIQUE: Multidetector CT imaging of the abdomen and pelvis was performed
following the standard protocol without IV contrast.
CT of the lumbar spine also acquired from the abdomen and pelvis
data set with sagittal and coronal reconstructions acquired.

[Series 2: stone full standard · axial · 0.69mm/px · z∈[-885,-545]mm · 11 of 82 slices shown, 13 images]
[im 7/82  soft-tissue]
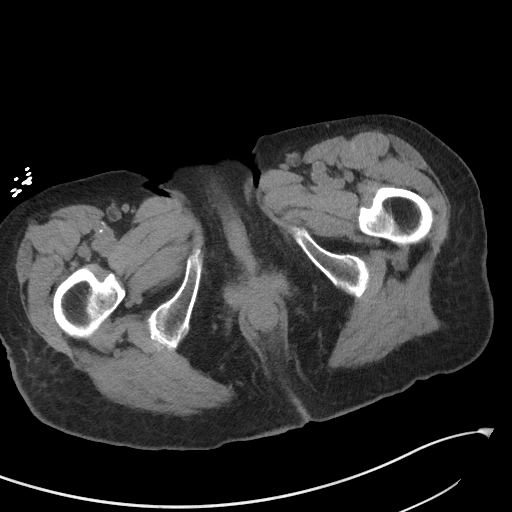
[im 7/82  bone]
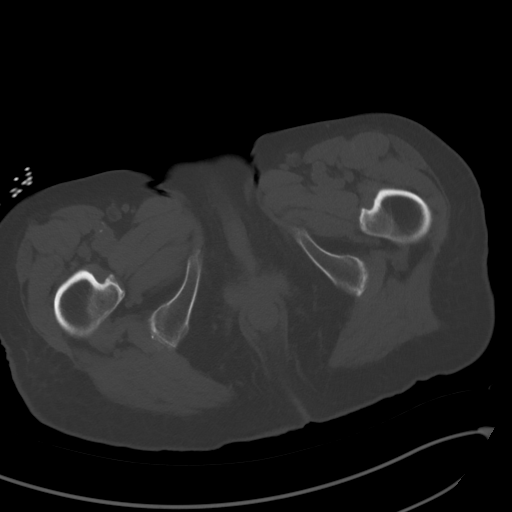
[im 13/82  soft-tissue]
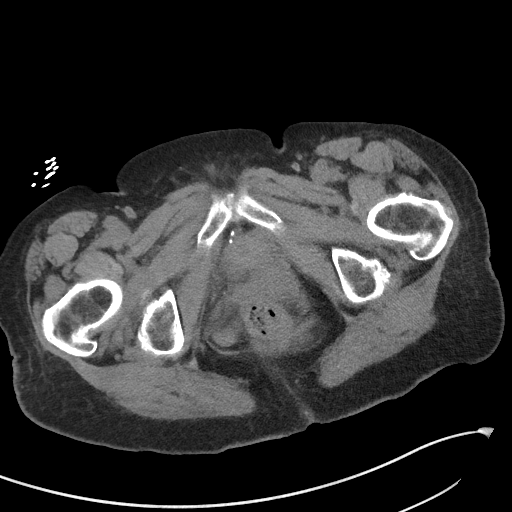
[im 20/82  soft-tissue]
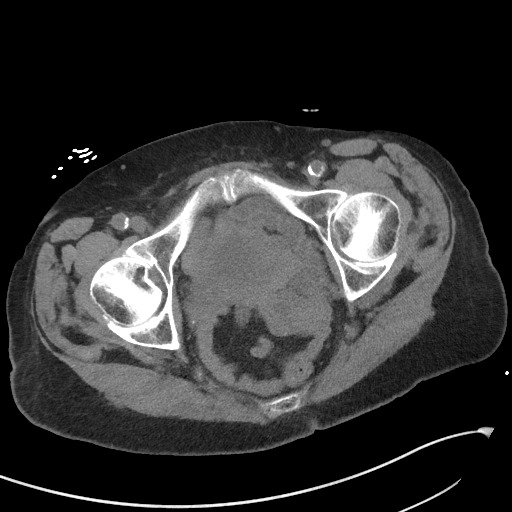
[im 26/82  soft-tissue]
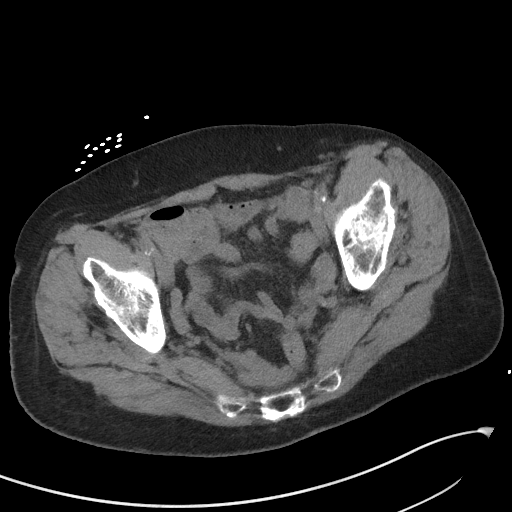
[im 33/82  soft-tissue]
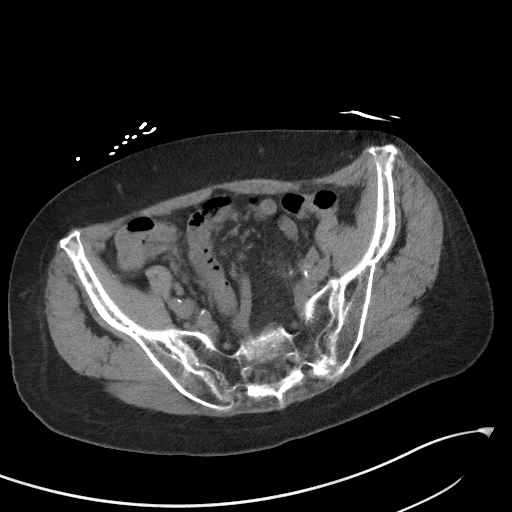
[im 43/82  soft-tissue]
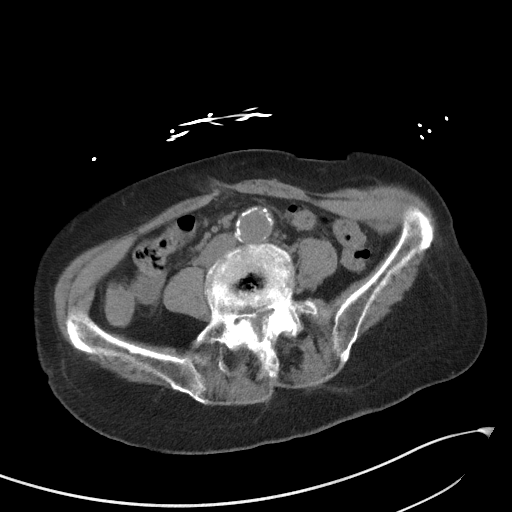
[im 49/82  soft-tissue]
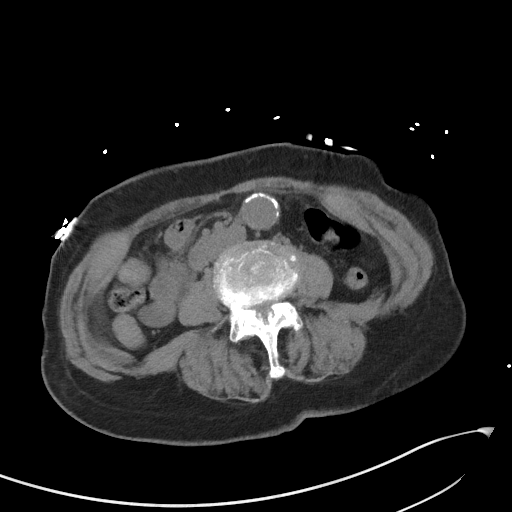
[im 56/82  soft-tissue]
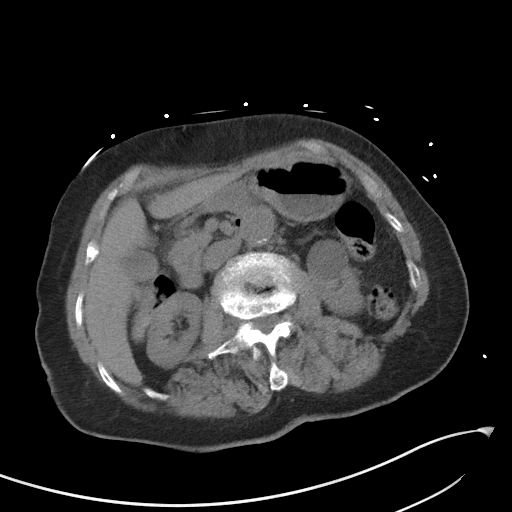
[im 62/82  soft-tissue]
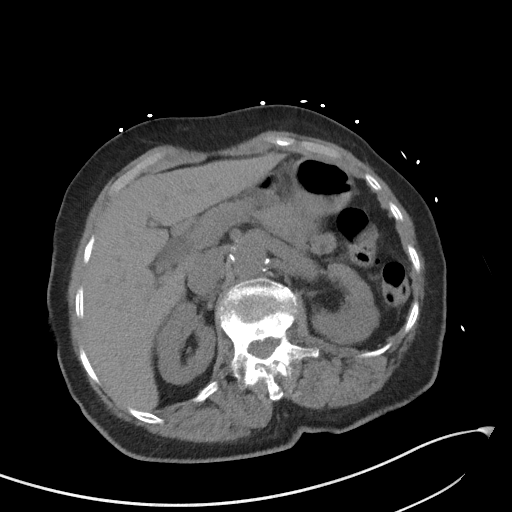
[im 62/82  bone]
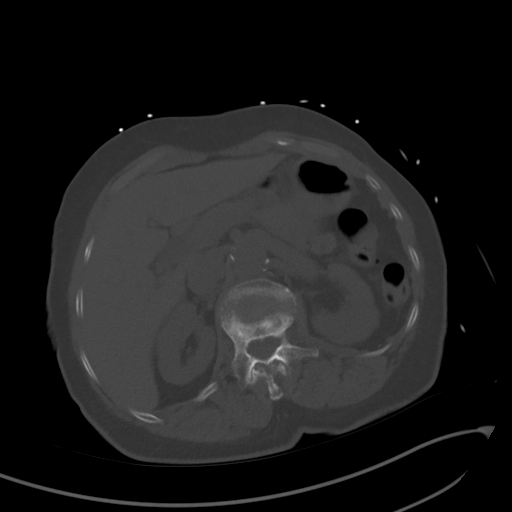
[im 69/82  soft-tissue]
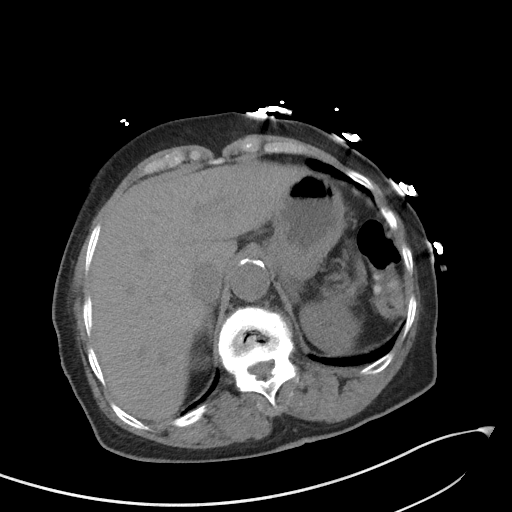
[im 75/82  soft-tissue]
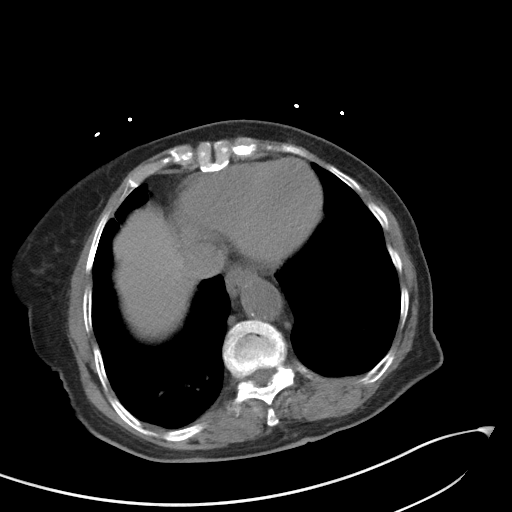

[Series 5: coronal · coronal · 0.70mm/px · 3 of 121 slices shown]
[im 41/121  soft-tissue]
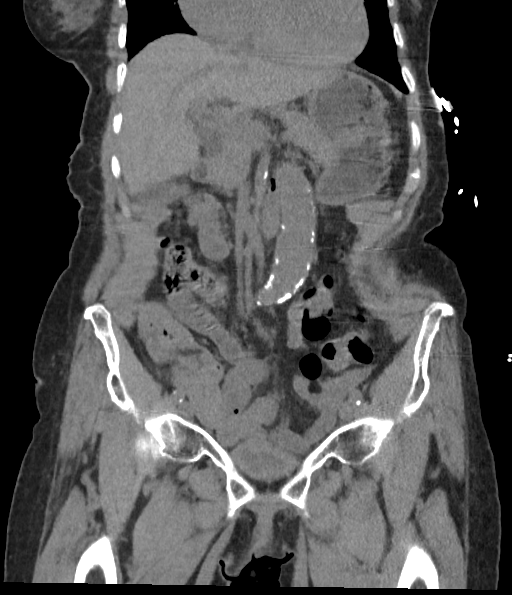
[im 54/121  soft-tissue]
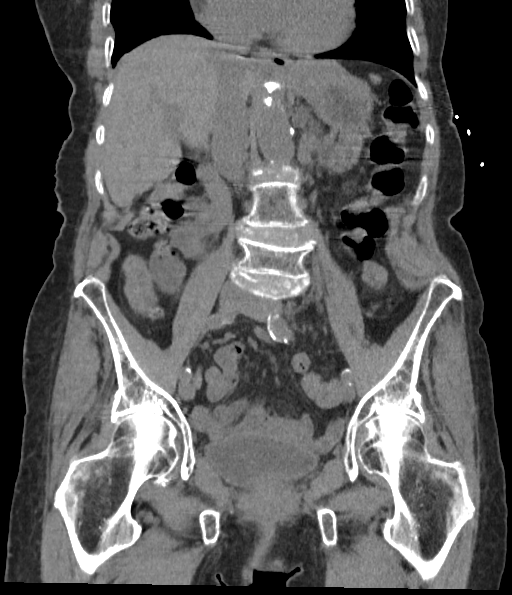
[im 67/121  soft-tissue]
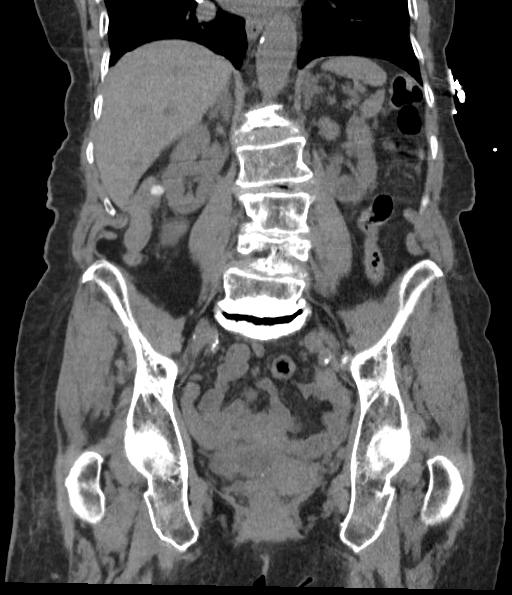

[14 of 46 positions shown; findings below may reference images not displayed]

FINDINGS: ABDOMEN AND PELVIS CT

Lower chest: Heart mildly enlarged. Calcified right lower lobe
granuloma and right inferior hila lymph node consistent with healed
granulomatous disease. No acute findings at the lung bases.

Hepatobiliary: No focal liver abnormality is seen. No gallstones,
gallbladder wall thickening, or biliary dilatation.

Pancreas: Unremarkable. No pancreatic ductal dilatation or
surrounding inflammatory changes.

Spleen: Normal in size without focal abnormality.

Adrenals/Urinary Tract: No adrenal masses. Kidneys normal in overall
size and position. Bilateral renal cortical thinning. 1.6 cm mass,
upper pole of the right kidney and 2.6 cm mass, lower pole the left
kidney, both consistent with cysts, both essentially stable. No
other renal masses, no stones and no hydronephrosis. Ureters normal
in course and in caliber. Bladder is unremarkable.

Stomach/Bowel: Normal stomach. Small bowel and colon are normal in
caliber. No wall thickening. No inflammation. No evidence of
appendicitis.

Vascular/Lymphatic: Mild aortic ectasia. No aneurysm. Aortic
atherosclerosis. No enlarged lymph nodes.

Reproductive: Uterus and bilateral adnexa are unremarkable.

Other: No abdominal wall hernia or abnormality. No abdominopelvic
ascites.

Musculoskeletal: Moderate compression fractures of T12 and L1, T12
new since the prior CT and L1 increased in severity. Mild chronic
compression fracture of L4. No other fractures. No bone lesions.

LUMBAR SPINE CT

Alignment: Grade 1 anterolisthesis of L4 on L5. No other
spondylolisthesis. Mild levoscoliosis.

Osseous structures: Moderate compression fractures of T12 and L1,
T12 new since the prior abdomen and pelvis CT (09/16/2019) and T12
increased in severity. Mild chronic compression fracture of L4. No
other fractures. No osteoblastic or osteolytic lesions.

Disc levels:

T11-T12: Mild disc bulging. No convincing herniation or significant
stenosis.

T12-L1: Minor disc bulging. No convincing disc herniation. No
stenosis.

L1-L2: No significant disc bulging. No convincing disc herniation or
stenosis.

L2-L3: Mild diffuse spondylotic disc bulging. No convincing disc
herniation. Mild bilateral facet degenerative change. Mild bilateral
neural foraminal narrowing.

L3-L4: Mild disc bulging and bilateral facet degenerative change. No
convincing disc herniation. Thickening and calcification of the
ligamentum flavum. There is central spinal stenosis, narrowing the
AP diameter of the spinal canal to 8-9 mm. Mild lateral recess
narrowing and moderate bilateral neural foraminal narrowing.

L4-L5: Moderate to marked facet degenerative change with ligamentum
flavum enlargement combines with disc bulging and the
anterolisthesis to cause severe central stenosis and lateral recess
narrowing as well as severe neural foraminal narrowing, greater on
the left.

L5-S1: Mild left and moderate right facet degenerative change. Mild
disc bulging. No convincing disc herniation. Moderate to severe left
neural foraminal narrowing.
IMPRESSION: Abdomen and pelvis CT

1. No acute findings.

Lumbar spine CT

1. Compression fractures of T12 and L1, T12 new since the prior CT
and L1 increased in severity. Either or both of these fractures may
be acute or have an acute component.
2. No other evidence of an acute abnormality of the lumbar spine.
Mild chronic compression fracture of L4.
3. Significant degenerative changes as detailed, most severe at
L4-L5 where there is severe tricompartmental stenosis.
